# Patient Record
Sex: Male | Born: 1990
Health system: Southern US, Community
[De-identification: ages and names within clinical notes are randomized; demographics above are authoritative.]

## PROBLEM LIST (undated history)

## (undated) DIAGNOSIS — T4145XA Adverse effect of unspecified anesthetic, initial encounter: Secondary | ICD-10-CM

## (undated) DIAGNOSIS — R1310 Dysphagia, unspecified: Secondary | ICD-10-CM

## (undated) DIAGNOSIS — R011 Cardiac murmur, unspecified: Secondary | ICD-10-CM

## (undated) DIAGNOSIS — B3781 Candidal esophagitis: Secondary | ICD-10-CM

## (undated) DIAGNOSIS — S62609A Fracture of unspecified phalanx of unspecified finger, initial encounter for closed fracture: Secondary | ICD-10-CM

## (undated) DIAGNOSIS — Q909 Down syndrome, unspecified: Secondary | ICD-10-CM

## (undated) DIAGNOSIS — K219 Gastro-esophageal reflux disease without esophagitis: Secondary | ICD-10-CM

## (undated) DIAGNOSIS — M1990 Unspecified osteoarthritis, unspecified site: Secondary | ICD-10-CM

## (undated) DIAGNOSIS — E039 Hypothyroidism, unspecified: Secondary | ICD-10-CM

## (undated) DIAGNOSIS — M9110 Juvenile osteochondrosis of head of femur [Legg-Calve-Perthes], unspecified leg: Secondary | ICD-10-CM

## (undated) DIAGNOSIS — F809 Developmental disorder of speech and language, unspecified: Secondary | ICD-10-CM

## (undated) DIAGNOSIS — Z9989 Dependence on other enabling machines and devices: Secondary | ICD-10-CM

## (undated) DIAGNOSIS — J189 Pneumonia, unspecified organism: Secondary | ICD-10-CM

## (undated) DIAGNOSIS — G4733 Obstructive sleep apnea (adult) (pediatric): Secondary | ICD-10-CM

## (undated) DIAGNOSIS — T7840XA Allergy, unspecified, initial encounter: Secondary | ICD-10-CM

## (undated) DIAGNOSIS — R192 Visible peristalsis: Secondary | ICD-10-CM

## (undated) DIAGNOSIS — B349 Viral infection, unspecified: Secondary | ICD-10-CM

## (undated) HISTORY — DX: Candidal esophagitis: B37.81

## (undated) HISTORY — DX: Hypothyroidism, unspecified: E03.9

## (undated) HISTORY — DX: Juvenile osteochondrosis of head of femur (Legg-Calve-Perthes), unspecified leg: M91.10

## (undated) HISTORY — DX: Fracture of unspecified phalanx of unspecified finger, initial encounter for closed fracture: S62.609A

## (undated) HISTORY — DX: Allergy, unspecified, initial encounter: T78.40XA

## (undated) HISTORY — PX: GUM SURGERY: SHX658

## (undated) HISTORY — DX: Viral infection, unspecified: B34.9

## (undated) HISTORY — DX: Down syndrome, unspecified: Q90.9

---
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
## 2011-03-19 ENCOUNTER — Ambulatory Visit
Admission: RE | Admit: 2011-03-19 | Discharge: 2011-03-19 | Disposition: A | Discharge status: Home / Self Care | Payer: Medicaid Other | Source: Ambulatory Visit | Attending: Pediatrics | Admitting: Pediatrics

## 2011-03-19 ENCOUNTER — Other Ambulatory Visit: Payer: Self-pay | Admitting: Pediatrics

## 2011-03-19 DIAGNOSIS — Q909 Down syndrome, unspecified: Secondary | ICD-10-CM

## 2011-04-11 NOTE — Op Note (Signed)
[place]. [facility]  Patient:    Ronald Le, Ronald Le                 MRN: [id number] Proc. Date: 03/04/00 Adm. Date:  [id number] Attending:  Maurilio Lovely                           Operative Report  DATE OF BIRTH:  18-Mar-1991  PREOPERATIVE DIAGNOSIS:  Multiple carious teeth and Down syndrome.  POSTOPERATIVE DIAGNOSIS:  Multiple carious teeth and Down syndrome.  OPERATION PERFORMED:  Full mouth dental rehabilitation.  SURGEON:  Cipriano Mile. Rohlfing, D.D.S., M.S.  ASSISTANT:  Abigail Butts  SPECIMENS:  Five teeth for count only.  These were given to the mother.  DRAINS:  None.  CULTURES:  None.  ESTIMATED BLOOD LOSS:  Less than 5 cc.  ANESTHESIA:  DESCRIPTION OF PROCEDURE:  The patient was brought from the holding area to operating room #7 at [facility].  The patient was placed in supine position on the operating table.  General anesthesia was induced by mask with sevoflurane, nitrous oxide and oxygen.  IV access was obtained and an oral intubation was established.  Six intraoral radiographs were obtained. A throat pack was placed at 12:03.  The dental treatment was as follows.  All teeth were isolated with a rubber dam and teeth restored as follows.  Tooth #3 received a mesial occlusal lingual composite.  Tooth 30 received an occlusal composite.  Tooth 19 received an occlusal composite.  Tooth 14 received an occlusal lingual composite.  Tooth J received a stainless steel crown Ion #2 cemented with Fuji cement. All composites were acid etched.  Single bond adhesive was placed, Flowit composite followed by Z100 composite and then Delton opaque sealant.  To obtain local anesthesia and hemorrhage control, 1.2 cc of 2% lidocaine with 1:100,000 epinephrine was used.  Teeth D, E, G, Q and 24S supernumerary were elevated and removed with forceps.  All teeth received a thorough prophylaxis and ____________ fluoride varnish.  The  mouth was thoroughly cleansed and the throat pack was removed at 1:17 p.m.  The patient tolerated the procedure well and was taken to PACU in stable condition with IV in place.DD:  03/04/00 TD:  03/05/00 Job: [id number]

## 2011-04-11 NOTE — Discharge Summary (Signed)
NAME:  Ronald Le, BORNTREGER NO.:  [id number]   MEDICAL RECORD NO.:  [id number]          PATIENT TYPE:  INP   LOCATION:  6120                         FACILITY:  [facility]   PHYSICIAN:  Pediatrics Resident    DATE OF BIRTH:  1991/10/17   DATE OF ADMISSION:  09/29/2006  DATE OF DISCHARGE:  10/09/2006                                 DISCHARGE SUMMARY   REASON FOR HOSPITALIZATION:  Hadden is a 20 year old white male with trisomy  21 and a 4 day history of escalating viral URI symptoms, with a history of  cyanotic lips, grunting, and oxygen saturations in the emergency department  of 88% on room air.  For a complete HPI please see the admission H&P.   HOSPITAL COURSE:  The patient received albuterol and Atrovent as well as  ceftriaxone IV x1 in the emergency department.  On November 7, the patient  was started on Cefuroxime 1.5 g IV q.8 h. for right lower lobe pneumonia, as  well as Albuterol every 4 hours as needed for wheezing, as well as incentive  spirometry due to increased chest tightness and poor air movement.  On  November 10, due to unimproved O2 requirements and an unchanged chest x-ray,  the patient was started on IV ceftriaxone and azithromycin.  The patient  completed a 5 day course of the azithromycin as in an inpatient.  The  patient continued to wean O2 for sats greater than or equal to 85%.  The  patient was also started on guaifenesin November 11 and continued p.r.n.  albuterol.  The required increase in O2 overnight is likely secondary to  obstructive sleep apnea and we would like to have this followed up as an  outpatient.  By November 16, the day of discharge, the patient was having  good saturations above 85% on room air and his condition was much improved.   OPERATION/PROCEDURE:  1. A chest x-ray was performed on November 6.  It showed a right lower      lobe infiltrate.  2. A chest was performed on November 10 and it was unchanged from the  previous.   FINAL DIAGNOSIS:  1. Right lower lobe pneumonia.  2. Trisomy 21.   DISCHARGE MEDICATIONS AND INSTRUCTIONS:  There are no discharge medications  for this patient.  The patient was asked to seek medical attention for any  respiratory distress, shortness of breath, decreased p.o. intake, or any  other concerns that the parents might have.  Pending issues and results to  be followed; there are no issues to be followed.   FOLLOWUP:  The patient was instructed to call Dr. Dillard Essex office, at [facility], on Tuesday to schedule a followup appointment.  The written  form of this discharge dictation was sent to Dr. Cardell Peach at [phone].  Could you  please fax this discharge dictation to Dr. Cardell Peach at [phone].   DISCHARGE WEIGHT:  44.2 kg   DISCHARGE CONDITION:  Stable and improved.           ______________________________  Pediatrics Resident     PR/MEDQ  D:  10/09/2006  T:  10/10/2006  Job:  [id number]

## 2011-04-11 NOTE — Op Note (Signed)
NAME:  Ronald Le, Ronald Le                    ACCOUNT NO.:  [id number]   MEDICAL RECORD NO.:  [id number]                   PATIENT TYPE:  OIB   LOCATION:  2899                                 FACILITY:  [facility]   PHYSICIAN:  Clydene Laming., D.D.S.          DATE OF BIRTH:  Mar 12, 1991   DATE OF PROCEDURE:  06/20/2004  DATE OF DISCHARGE:                                 OPERATIVE REPORT   DESCRIPTION OF PROCEDURE:  The patient was premedicated and brought to the  operating room and placed on the table in supine position where he remained  throughout the entire procedure.  After successful oral intubation, the  patient was prepped and draped for an intraoral procedure.  No throat pack  was used.  Facial infiltration and right palatal infiltration 2% lidocaine  1:100,000 epinephrine.  Marginal split thickness bed was prepared on the  facial aspect of tooth #25.  Palatal donor tissue was harvested and sutured  coronally, laterally and apically with 5-0 gut suture interrupted.  Zone  bandage was placed over the facial aspect of the gingival graft.  There was  excellent hemostasis, suctioned throat dry.  The patient was then extubated  on the table and found to be breathing well on his own.  Negligible blood  loss during the procedure.  There were no cultures or drains, no biopsies  were submitted.  The operation proceeded smoothly and lasted approximately  20 minutes.  The patient was returned to the recovery room and was to be  released as indicated.  Postoperative instructions were reviewed with  parents in the reception room.                                               Clydene Laming., D.D.S.    RJK/MEDQ  D:  06/20/2004  T:  06/20/2004  Job:  [id number]

## 2011-10-15 ENCOUNTER — Ambulatory Visit (INDEPENDENT_AMBULATORY_CARE_PROVIDER_SITE_OTHER): Payer: BC Managed Care – PPO | Admitting: Medical

## 2011-10-15 ENCOUNTER — Encounter: Payer: Self-pay | Admitting: Medical

## 2011-10-15 VITALS — BP 100/80 | HR 96 | Temp 98.4°F | Resp 20 | Ht <= 58 in | Wt 124.0 lb

## 2011-10-15 DIAGNOSIS — R519 Headache, unspecified: Secondary | ICD-10-CM | POA: Insufficient documentation

## 2011-10-15 DIAGNOSIS — J329 Chronic sinusitis, unspecified: Secondary | ICD-10-CM

## 2011-10-15 DIAGNOSIS — R51 Headache: Secondary | ICD-10-CM | POA: Insufficient documentation

## 2011-10-15 MED ORDER — AMOXICILLIN 875 MG PO TABS: 875.0000 mg | ORAL_TABLET | Freq: Two times a day (BID) | ORAL | Status: AC

## 2011-10-15 NOTE — Progress Notes (Signed)
Subjective:   HPI  Ronald Le is a 20 y.o. male who presents as a new patient today with his parents for c/o headache.  He has a history of Down Syndrome.  Concerns today: been having headaches recently.  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  He was at practice last night doing sit ups.  In the middle of situps turned "white as a sheet", stopped situps immediately and was crying with the headache.  Denied blurred vision, sinus pain, no numbness, tingling.  Gave Tylenol that evening, didn't finish supper, and ended up going to sleep.  Felt some improvement this morning, but headache still ongoing.  They note a few other recent episodes of headache.  Neither headache was related to activity.  These were just random.  He does have allergies, and has complained some of sore throat, frontal headache, and runny nose.  He does have hx/o sinus infection.  He also has hx/o being hospitalized for pneumonia but he can be very sick before his exam findings show.  He also hardly ever gets sick, so when he requests to go to the doctor, his parents take note right away.  Last week he was coughing at school, grabbed throat, and teacher thought he was choking.  He at times tends to eat fast.  He has had a few random episodes of difficulty swallowing in the last few weeks, but not a regular occurrence.  Parents do remind him to eat slowly and chew food, eat small pieces.  No other aggravating or relieving factors.  No other c/o.  The following portions of the patient's history were reviewed and updated as appropriate: allergies, current medications, past family history, past medical history, past social history, past surgical history and problem list.  Past Medical History  Diagnosis Date  . Allergy   . Hypothyroidism   . Legg-Calve-Perthes disease   . Sleep apnea   . Down's syndrome   . History of pneumonia     multiple hospitalizions as toddler, but 10 day stay age 20yo  . Rash    hospitalized as a child, etiology latent viral exanthem   Review of Systems Constitutional: -fever, -chills, -sweats, +unexpected -weight change,-fatigue ENT: +runny nose, -ear pain, -sore throat Cardiology:  +chest pain, -palpitations, -edema Respiratory: +cough, -shortness of breath, -wheezing Gastroenterology: +abdominal pain, -nausea, -vomiting, -diarrhea, -constipation Hematology: -bleeding or bruising problems Musculoskeletal: -arthralgias, -myalgias, -joint swelling, -back pain Ophthalmology: -vision changes Urology: -dysuria, -difficulty urinating, -hematuria, -urinary frequency, -urgency Neurology: +headache, -weakness, -tingling, -numbness    Objective:   Physical Exam  Filed Vitals:   10/15/11 1447  BP: 100/80  Pulse: 96  Temp: 98.4 F (36.9 C)  Resp: 20    General appearance: alert, no distress, WD/WN, white male HEENT: normocephalic, sclerae anicteric, PERRLA, EOMi, left TM somewhat retracted, right TM normal, nares with turbinate edema, clear discharge. but no erythema, pharynx normal Oral cavity: MMM, no lesions Neck: supple, no lymphadenopathy, no thyromegaly, no masses Heart: RRR, normal S1, S2, no murmurs Lungs: CTA bilaterally, no wheezes, rhonchi, or rales Abdomen: +bs, soft, non tender, non distended, no masses, no hepatomegaly, no splenomegaly Back: non tender Musculoskeletal: nontender, no swelling, no obvious deformity Extremities: no edema, no cyanosis, no clubbing Pulses: 2+ symmetric, upper and lower extremities, normal cap refill Neurological: CN2-12 intact, strength normal upper extremities and lower extremities, sensation normal throughout, DTRs 2+ throughout, no cerebellar signs, gait normal Psychiatric: normal affect, behavior normal, pleasant    Assessment and Plan :  Encounter Diagnoses  Name Primary?  . Headache Yes  . Sinusitis    Discussed possible etiologies for headache.  Given exam and recent symptoms and prior history of  sinusitis, sinusitis seems likely.  Script for Amoxicillin, advised rest, hydrate well, Tylenol or Ibuprofen for pain, and call in 5-7 days to let me know if improving.  No worrisome findings today of more concerning etiology.  Advised if worrisome signs, such as high fever, numbness, weakness, double vision, intractable headache, or just worsening in general, to call, return, or go to the ED.  Advised to encourage him to eat slowly, chew, and drink plenty of fluids with meals.  If symptoms of dysphagia continue, then recheck.  Otherwise, recheck 1wk.

## 2011-12-25 ENCOUNTER — Encounter: Payer: Self-pay | Admitting: *Deleted

## 2012-04-22 ENCOUNTER — Encounter (HOSPITAL_COMMUNITY): Payer: Self-pay | Admitting: Emergency Medicine

## 2012-04-22 ENCOUNTER — Emergency Department (HOSPITAL_COMMUNITY)
Admission: EM | Admit: 2012-04-22 | Discharge: 2012-04-23 | Disposition: A | Discharge status: Home / Self Care | Payer: BC Managed Care – PPO | Attending: Emergency Medicine | Admitting: Emergency Medicine

## 2012-04-22 ENCOUNTER — Emergency Department (HOSPITAL_COMMUNITY): Payer: BC Managed Care – PPO

## 2012-04-22 DIAGNOSIS — R05 Cough: Secondary | ICD-10-CM | POA: Insufficient documentation

## 2012-04-22 DIAGNOSIS — G4730 Sleep apnea, unspecified: Secondary | ICD-10-CM | POA: Insufficient documentation

## 2012-04-22 DIAGNOSIS — Q909 Down syndrome, unspecified: Secondary | ICD-10-CM | POA: Insufficient documentation

## 2012-04-22 DIAGNOSIS — E039 Hypothyroidism, unspecified: Secondary | ICD-10-CM | POA: Insufficient documentation

## 2012-04-22 DIAGNOSIS — J189 Pneumonia, unspecified organism: Secondary | ICD-10-CM

## 2012-04-22 DIAGNOSIS — R059 Cough, unspecified: Secondary | ICD-10-CM | POA: Insufficient documentation

## 2012-04-22 DIAGNOSIS — M4120 Other idiopathic scoliosis, site unspecified: Secondary | ICD-10-CM | POA: Insufficient documentation

## 2012-04-22 DIAGNOSIS — R509 Fever, unspecified: Secondary | ICD-10-CM | POA: Insufficient documentation

## 2012-04-22 LAB — URINALYSIS, ROUTINE W REFLEX MICROSCOPIC
Hgb urine dipstick: NEGATIVE
Ketones, ur: >80 mg/dL — AB
Nitrite: NEGATIVE
Protein, ur: NEGATIVE mg/dL
Urobilinogen, UA: 0.2 mg/dL (ref 0.0–1.0)

## 2012-04-22 LAB — BASIC METABOLIC PANEL
BUN: 14 mg/dL (ref 6–23)
Calcium: 9.5 mg/dL (ref 8.4–10.5)
Creatinine, Ser: 1.02 mg/dL (ref 0.50–1.35)
GFR calc Af Amer: >90 mL/min (ref >90)
GFR calc non Af Amer: >90 mL/min (ref >90)
Potassium: 3.6 mEq/L (ref 3.5–5.1)

## 2012-04-22 LAB — DIFFERENTIAL
Basophils Relative: 0 % (ref 0–1)
Eosinophils Absolute: 0 10*3/uL (ref 0.0–0.7)
Eosinophils Relative: 0 % (ref 0–5)
Monocytes Relative: 10 % (ref 3–12)
Neutrophils Relative %: 81 % — ABNORMAL HIGH (ref 43–77)

## 2012-04-22 LAB — CBC
Hemoglobin: 16.5 g/dL (ref 13.0–17.0)
MCH: 33 pg (ref 26.0–34.0)
MCHC: 36.3 g/dL — ABNORMAL HIGH (ref 30.0–36.0)
MCV: 91 fL (ref 78.0–100.0)

## 2012-04-22 MED ORDER — SODIUM CHLORIDE 0.9 % IV BOLUS (SEPSIS)
1000.0000 mL | Freq: Once | INTRAVENOUS | Status: AC
  Administered 2012-04-22: 1000 mL via INTRAVENOUS

## 2012-04-22 MED ORDER — DEXTROSE 5 % IV SOLN
500.0000 mg | Freq: Once | INTRAVENOUS | Status: AC
  Administered 2012-04-22: 500 mg via INTRAVENOUS
  Filled 2012-04-22: qty 500

## 2012-04-22 MED ORDER — DEXTROSE 5 % IV SOLN
1.0000 g | Freq: Once | INTRAVENOUS | Status: AC
  Administered 2012-04-22: 1 g via INTRAVENOUS
  Filled 2012-04-22: qty 10

## 2012-04-22 MED ORDER — ACETAMINOPHEN 325 MG PO TABS
650.0000 mg | ORAL_TABLET | Freq: Once | ORAL | Status: AC
  Administered 2012-04-22: 650 mg via ORAL
  Filled 2012-04-22: qty 2

## 2012-04-22 NOTE — ED Notes (Signed)
PARENTS REPORTS FEVER SINCE YESTERDAY WITH OCCASIONAL DRY COUGH / MID ABDOMINAL PAIN AND BACK PAIN , PT. HAS DOWN'S SYNDROME .

## 2012-04-23 LAB — HEPATIC FUNCTION PANEL
ALT: 15 U/L (ref 0–53)
Alkaline Phosphatase: 81 U/L (ref 39–117)
Indirect Bilirubin: 0.5 mg/dL (ref 0.3–0.9)
Total Bilirubin: 0.7 mg/dL (ref 0.3–1.2)

## 2012-04-23 MED ORDER — ACETAMINOPHEN-CODEINE 120-12 MG/5ML PO SUSP: 5.0000 mL | Freq: Three times a day (TID) | ORAL | Status: DC | PRN

## 2012-04-23 MED ORDER — MOXIFLOXACIN HCL 400 MG PO TABS: 400.0000 mg | ORAL_TABLET | Freq: Every day | ORAL | Status: DC

## 2012-04-23 NOTE — ED Provider Notes (Signed)
History     CSN: [id number]  Arrival date & time 04/22/12  2225   First MD Initiated Contact with Patient 04/22/12 2257      Chief Complaint  Patient presents with  . Fever    (Consider location/radiation/quality/duration/timing/severity/associated sxs/prior treatment) HPI History provided by patient and parents bedside. Has history of Down syndrome and is somewhat limited historian. He developed fever and cough tonight. Mother very concerned because fever persists despite Motrin and Tylenol at home.  Temp to 101.  No productive sputum. Mother also very concerned because she developed bilateral pneumonia last year requiring admission for significant hypoxia. No rash. No recent travel. No known sick contacts. Does use CPAP at night. Is not complaining of any shortness of breath at this time. Has got up to use the restroom emergency department without any difficulty breathing while ambulating.  Patient denies any current complaints other than feels thirsty.  Past Medical History  Diagnosis Date  . Allergy   . Hypothyroidism   . Legg-Calve-Perthes disease   . Sleep apnea   . Down's syndrome   . History of pneumonia     multiple hospitalizions as toddler, but 10 day stay age 11yo  . Rash     hospitalized as a child, etiology latent viral exanthem  . Viral syndrome 09/1995    hospital viral syndrome.  . Chronic serous otitis media   . Scoliosis 6/99    leg length discrepancy.  . Finger fracture 2009    h/o    Past Surgical History  Procedure Date  . Hip arthroplasty     due to Legg-Calve-Perthe dz and subsequent surgery to remove hardware  . Tympanostomy tube placement   . Gum surgery     graft to gums from roof of mouth.    Family History  Problem Relation Age of Onset  . Irritable bowel syndrome Mother   . Interstitial cystitis Mother   . Gallbladder disease Father     gallbladder surgery  . Migraines Sister   . Alzheimer's disease Maternal Grandmother     dementia.     History  Substance Use Topics  . Smoking status: Never Smoker   . Smokeless tobacco: Not on file  . Alcohol Use: No      Review of Systems  Constitutional: Positive for fever.  HENT: Negative for neck pain and neck stiffness.   Eyes: Negative for discharge.  Respiratory: Positive for cough.   Gastrointestinal: Negative for abdominal pain.  Genitourinary: Negative for flank pain.  Musculoskeletal: Negative for back pain.  Skin: Negative for rash.  Neurological: Negative for headaches.  All other systems reviewed and are negative.    Allergies  Review of patient's allergies indicates no known allergies.  Home Medications   Current Outpatient Rx  Name Route Sig Dispense Refill  . LEVOTHYROXINE SODIUM 88 MCG PO TABS Oral Take 88 mcg by mouth daily.      . ACETAMINOPHEN-CODEINE 120-12 MG/5ML PO SUSP Oral Take 5 mLs by mouth every 8 (eight) hours as needed (Take at nighttime as needed for cough). 60 mL 0  . MOXIFLOXACIN HCL 400 MG PO TABS Oral Take 1 tablet (400 mg total) by mouth daily. 10 tablet 0    BP 110/75  Pulse 118  Temp(Src) 98.9 F (37.2 C) (Rectal)  Resp 20  SpO2 96%  Physical Exam  Constitutional: He appears well-developed and well-nourished.  HENT:  Head: Normocephalic and atraumatic.  Eyes: Conjunctivae and EOM are normal. Pupils are equal, round, and  reactive to light.  Neck: Trachea normal. Neck supple. No thyromegaly present.  Cardiovascular: Normal rate, regular rhythm, S1 normal, S2 normal and normal pulses.     No systolic murmur is present   No diastolic murmur is present  Pulses:      Radial pulses are 2+ on the right side, and 2+ on the left side.  Pulmonary/Chest: Effort normal and breath sounds normal. He has no wheezes. He has no rhonchi. He has no rales. He exhibits no tenderness.       Good air movement without respiratory distress  Abdominal: Soft. Normal appearance and bowel sounds are normal. There is no tenderness. There is no  CVA tenderness and negative Murphy's sign.  Musculoskeletal:       BLE:s Calves nontender, no cords or erythema, negative Homans sign  Neurological: He is alert. He has normal strength. No cranial nerve deficit or sensory deficit. GCS eye subscore is 4. GCS verbal subscore is 5. GCS motor subscore is 6.       Interactive, smiling and appropriate  Skin: Skin is warm and dry. No rash noted. He is not diaphoretic.    ED Course  Procedures (including critical care time)  Labs Reviewed  CBC - Abnormal; Notable for the following:    WBC 13.7 (*)    MCHC 36.3 (*)    All other components within normal limits  DIFFERENTIAL - Abnormal; Notable for the following:    Neutrophils Relative 81 (*)    Neutro Abs 11.1 (*)    Lymphocytes Relative 8 (*)    Monocytes Absolute 1.4 (*)    All other components within normal limits  BASIC METABOLIC PANEL - Abnormal; Notable for the following:    Glucose, Bld 111 (*)    All other components within normal limits  URINALYSIS, ROUTINE W REFLEX MICROSCOPIC - Abnormal; Notable for the following:    Bilirubin Urine SMALL (*)    Ketones, ur >80 (*)    All other components within normal limits  LIPASE, BLOOD  HEPATIC FUNCTION PANEL   Dg Chest 2 View  04/22/2012  *RADIOLOGY REPORT*  Clinical Data: Cough with fever and headaches for 2 days.  CHEST - 2 VIEW  Comparison: 10/03/2006.  Findings: Normal heart size with slight hyperinflation. Faint opacity right lower lung zones, likely right lower lobe, suggests early pneumonia.  Mild peribronchial thickening.  No effusion or pneumothorax.   Bones are negative.  Similar appearance to priors.  IMPRESSION: Slight hyperinflation. Cannot exclude early right lower lobe infiltrate.  Mild peribronchial thickening.  Original Report Authenticated By: Elsie Stain, M.D.     1. Community acquired pneumonia     Patient evaluated with x-ray and labs as above. For clinical pneumonia, IV antibiotics initiated  No hypoxia or  hypotension. Tachycardia improved with fluids. Serial evaluations remains unchanged  MDM   Cough and fevers with pneumonia as above. Stable for outpatient treatment and followup. Prescription for antibiotics and Tylenol with codeine for cough provided.         Sunnie Nielsen, MD 04/23/12 [phone]

## 2012-04-23 NOTE — Discharge Instructions (Signed)

## 2012-04-24 DIAGNOSIS — B3781 Candidal esophagitis: Secondary | ICD-10-CM

## 2012-04-24 HISTORY — DX: Candidal esophagitis: B37.81

## 2012-04-29 ENCOUNTER — Ambulatory Visit (INDEPENDENT_AMBULATORY_CARE_PROVIDER_SITE_OTHER): Payer: BC Managed Care – PPO | Admitting: Family Medicine

## 2012-04-29 ENCOUNTER — Ambulatory Visit
Admission: RE | Admit: 2012-04-29 | Discharge: 2012-04-29 | Disposition: A | Discharge status: Home / Self Care | Payer: BC Managed Care – PPO | Source: Ambulatory Visit | Attending: Family Medicine | Admitting: Family Medicine

## 2012-04-29 ENCOUNTER — Encounter: Payer: Self-pay | Admitting: Family Medicine

## 2012-04-29 VITALS — BP 108/60 | HR 88 | Temp 97.4°F | Ht <= 58 in | Wt 108.0 lb

## 2012-04-29 DIAGNOSIS — Q909 Down syndrome, unspecified: Secondary | ICD-10-CM | POA: Insufficient documentation

## 2012-04-29 DIAGNOSIS — J189 Pneumonia, unspecified organism: Secondary | ICD-10-CM

## 2012-04-29 DIAGNOSIS — R1110 Vomiting, unspecified: Secondary | ICD-10-CM

## 2012-04-29 DIAGNOSIS — E039 Hypothyroidism, unspecified: Secondary | ICD-10-CM

## 2012-04-29 LAB — CBC WITH DIFFERENTIAL/PLATELET
Lymphocytes Relative: 5 % — ABNORMAL LOW (ref 12–46)
MCHC: 33.6 g/dL (ref 30.0–36.0)
Monocytes Absolute: 0.8 10*3/uL (ref 0.1–1.0)
Neutro Abs: 14.7 10*3/uL — ABNORMAL HIGH (ref 1.7–7.7)
Platelets: 254 10*3/uL (ref 150–400)
RDW: 12.6 % (ref 11.5–15.5)
WBC: 16.3 10*3/uL — ABNORMAL HIGH (ref 4.0–10.5)

## 2012-04-29 MED ORDER — ONDANSETRON 4 MG PO TBDP: 4.0000 mg | ORAL_TABLET | Freq: Three times a day (TID) | ORAL | Status: DC | PRN

## 2012-04-29 NOTE — Progress Notes (Signed)
Chief Complaint  Patient presents with  . Follow-up    went to hospital, and ot any better, hurts to eat and no energy, tempature is up and down   HPI:  Patient went to ER 5/31 with fever 101 and worsening cough.  ER notes reviewed.  WBC 13.7.  CXR showed possibly early RLL infiltrate, mild peribronchial thickening. He was put on Avelox, and initially seemed to be doing better--fever went away, energy improved, back to his usual self, with occasional cough.  Today he started coughing very deeply "coughing up a lung".  Tactile fevers (didn't use thermometer), went up and down.  Started vomiting at 1pm.  Father reports that the vomiting seems to be due to a coughing spell.  Decreased appetite today.  Difficult to truly assess if there is nausea, with vomiting, vs only post-tussive emesis.    Denies sneezing, some nasal congestion.  Some frontal headaches. Some increased shortness of breath noted just this afternoon.    Past Medical History  Diagnosis Date  . Allergy   . Hypothyroidism   . Legg-Calve-Perthes disease   . Sleep apnea   . Down's syndrome   . History of pneumonia     multiple hospitalizions as toddler, but 10 day stay age 19yo  . Rash     hospitalized as a child, etiology latent viral exanthem  . Viral syndrome 09/1995    hospital viral syndrome.  . Chronic serous otitis media   . Scoliosis 6/99    leg length discrepancy.  . Finger fracture 2009    h/o   Past Surgical History  Procedure Date  . Hip arthroplasty     due to Legg-Calve-Perthe dz and subsequent surgery to remove hardware  . Tympanostomy tube placement   . Gum surgery     graft to gums from roof of mouth.   History   Social History  . Marital Status: Single    Spouse Name: N/A    Number of Children: N/A  . Years of Education: N/A   Occupational History  . Not on file.   Social History Main Topics  . Smoking status: Never Smoker   . Smokeless tobacco: Not on file  . Alcohol Use: No  . Drug Use:  No  . Sexually Active: Not on file   Other Topics Concern  . Not on file   Social History Narrative  . No narrative on file    Current Outpatient Prescriptions on File Prior to Visit  Medication Sig Dispense Refill  . acetaminophen-codeine 120-12 MG/5ML suspension Take 5 mLs by mouth every 8 (eight) hours as needed (Take at nighttime as needed for cough).  60 mL  0  . levothyroxine (SYNTHROID, LEVOTHROID) 88 MCG tablet Take 88 mcg by mouth daily.        Marland Kitchen moxifloxacin (AVELOX) 400 MG tablet Take 1 tablet (400 mg total) by mouth daily.  10 tablet  0   No Known Allergies  ROS:  Denies ear pain, diarrhea, skin rash.  Limited ability to get ROS, father assisting some. +headache, sore throat, cough, vomiting, belly pain after vomiting.  PHYSICAL EXAM: BP 108/60  Pulse 88  Temp(Src) 97.4 F (36.3 C) (Oral)  Ht 4\' 9"  (1.448 m)  Wt 108 lb (48.988 kg)  BMI 23.37 kg/m2 Pulse ox 98%, P 150--checked shortly after vomiting. Pleasant male with Downs Syndrome, accompanied by his father.  Actively gagging during part of visit, sometimes after coughing, other times just gagging (and no vomiting).  He  appears in no acute distress between episodes, breathing comfortably.  Only speaks short 1 word answers, as is typical for him per dad (not due to dyspnea) HEENT:  Conjunctiva clear. TM's--partly obscured by cerumen.  Chronic perforation (s/p tubes) on R.  OP--thickened mucus, somewhat dry; some thickened yellowish phlegm on R side posteriorly Neck: no lymphadenopathy Heart: tachycardic--pulse 125 at time of my exam, about 15 minutes after vomiting (when had gone up to 150).  No murmur Lungs: clear bilaterally with good air movement. Abdomen: soft (not laid down for exam given that he was sitting up with trash can most of visit). Skin: no rash Extremities: no cyanosis or edema  ASSESSMENT/PLAN: 1. Pneumonia  CBC with Differential, DG Chest 2 View  2. Vomiting  ondansetron (ZOFRAN ODT) 4 MG  disintegrating tablet   Pneumonia clinically appeared to be resolving, per father, until worsening symptoms with recurrent tactile fevers and vomiting started today.  Check CBC to see if WBC improving vs worsening.  Recheck CXR.  I feel that thick mucus is likely contributing to his gagging--may have some postnasal drainage also (and any sinus infection should also be covered by the Avelox he is taking). Complete course of Avelox Increase fluid intake Guaifenesin is recommended  Return next week for f/u, to ER sooner if worsening symptoms.  Will notify of results later today.  If worsening, may need to be admitted for IV ABX (clinically, lungs sounded pretty good).  Results: Lab Results  Component Value Date   WBC 16.3* 04/29/2012   HGB 18.2* 04/29/2012   HCT 54.3* 04/29/2012   MCV 94.8 04/29/2012   PLT 254 04/29/2012   WBC higher, appears to be dehydrated. CXR normal  Parent advised to try zofran and orally hydrate, to go ER if getting worse, f/u next week. Apparently had 2 episodes of diarrhea after leaving office, as well as further emesis (prior to starting Zofran)

## 2012-04-29 NOTE — Patient Instructions (Signed)
Try and drink plenty of fluids/water. Take guaifenesin (ie Mucinex, Robitussin) to help thin out the secretions. Continue to take Avelox (finish the course of antibiotics as prescribed in ER). Take the ondansetron if needed for nausea/vomiting  Take him to ER if worsening shortness of breath, unable to keep fluids down, getting dehydrated.  We will be in touch with results of blood test and x-ray probably later today (or tomorrow morning at latest).  Follow up next week if seeming to improve over the next few days, to ER sooner if worse.

## 2012-05-01 ENCOUNTER — Inpatient Hospital Stay (HOSPITAL_COMMUNITY)
Admission: EM | Admit: 2012-05-01 | Discharge: 2012-05-04 | DRG: 183 | Disposition: A | Discharge status: Home / Self Care | Payer: BC Managed Care – PPO | Attending: Internal Medicine | Admitting: Internal Medicine

## 2012-05-01 ENCOUNTER — Encounter (HOSPITAL_COMMUNITY): Payer: Self-pay | Admitting: Emergency Medicine

## 2012-05-01 ENCOUNTER — Emergency Department (HOSPITAL_COMMUNITY): Payer: BC Managed Care – PPO

## 2012-05-01 DIAGNOSIS — E860 Dehydration: Secondary | ICD-10-CM | POA: Diagnosis present

## 2012-05-01 DIAGNOSIS — R112 Nausea with vomiting, unspecified: Secondary | ICD-10-CM

## 2012-05-01 DIAGNOSIS — J159 Unspecified bacterial pneumonia: Secondary | ICD-10-CM

## 2012-05-01 DIAGNOSIS — R197 Diarrhea, unspecified: Secondary | ICD-10-CM | POA: Diagnosis present

## 2012-05-01 DIAGNOSIS — B3781 Candidal esophagitis: Secondary | ICD-10-CM | POA: Diagnosis present

## 2012-05-01 DIAGNOSIS — Z79899 Other long term (current) drug therapy: Secondary | ICD-10-CM

## 2012-05-01 DIAGNOSIS — J209 Acute bronchitis, unspecified: Secondary | ICD-10-CM

## 2012-05-01 DIAGNOSIS — B370 Candidal stomatitis: Secondary | ICD-10-CM | POA: Diagnosis present

## 2012-05-01 DIAGNOSIS — M9190 Juvenile osteochondrosis of hip and pelvis, unspecified, unspecified leg: Secondary | ICD-10-CM | POA: Diagnosis present

## 2012-05-01 DIAGNOSIS — J189 Pneumonia, unspecified organism: Secondary | ICD-10-CM | POA: Diagnosis present

## 2012-05-01 DIAGNOSIS — R1310 Dysphagia, unspecified: Secondary | ICD-10-CM | POA: Diagnosis present

## 2012-05-01 DIAGNOSIS — G4733 Obstructive sleep apnea (adult) (pediatric): Secondary | ICD-10-CM | POA: Diagnosis present

## 2012-05-01 DIAGNOSIS — Q909 Down syndrome, unspecified: Secondary | ICD-10-CM

## 2012-05-01 DIAGNOSIS — E039 Hypothyroidism, unspecified: Secondary | ICD-10-CM | POA: Diagnosis present

## 2012-05-01 LAB — POCT I-STAT, CHEM 8
Creatinine, Ser: 1.2 mg/dL (ref 0.50–1.35)
Glucose, Bld: 93 mg/dL (ref 70–99)
HCT: 51 % (ref 39.0–52.0)
Hemoglobin: 17.3 g/dL — ABNORMAL HIGH (ref 13.0–17.0)
Sodium: 142 mEq/L (ref 135–145)
TCO2: 25 mmol/L (ref 0–100)

## 2012-05-01 LAB — URINALYSIS, ROUTINE W REFLEX MICROSCOPIC
Glucose, UA: NEGATIVE mg/dL
Hgb urine dipstick: NEGATIVE
Specific Gravity, Urine: 1.02 (ref 1.005–1.030)
Urobilinogen, UA: 0.2 mg/dL (ref 0.0–1.0)

## 2012-05-01 LAB — CBC
HCT: 48.3 % (ref 39.0–52.0)
Hemoglobin: 16.8 g/dL (ref 13.0–17.0)
MCH: 31.8 pg (ref 26.0–34.0)
MCHC: 34.8 g/dL (ref 30.0–36.0)
RBC: 5.29 MIL/uL (ref 4.22–5.81)

## 2012-05-01 LAB — DIFFERENTIAL
Basophils Relative: 0 % (ref 0–1)
Lymphs Abs: 1.4 10*3/uL (ref 0.7–4.0)
Monocytes Absolute: 1.3 10*3/uL — ABNORMAL HIGH (ref 0.1–1.0)
Monocytes Relative: 6 % (ref 3–12)
Neutro Abs: 17 10*3/uL — ABNORMAL HIGH (ref 1.7–7.7)
Neutrophils Relative %: 86 % — ABNORMAL HIGH (ref 43–77)

## 2012-05-01 LAB — HEPATIC FUNCTION PANEL
Bilirubin, Direct: 0.1 mg/dL (ref 0.0–0.3)
Indirect Bilirubin: 0.4 mg/dL (ref 0.3–0.9)
Total Protein: 7 g/dL (ref 6.0–8.3)

## 2012-05-01 LAB — PROCALCITONIN: Procalcitonin: 0.18 ng/mL

## 2012-05-01 LAB — LACTIC ACID, PLASMA: Lactic Acid, Venous: 1.5 mmol/L (ref 0.5–2.2)

## 2012-05-01 MED ORDER — SODIUM CHLORIDE 0.9 % IV SOLN
INTRAVENOUS | Status: DC
  Administered 2012-05-01 (×2): via INTRAVENOUS

## 2012-05-01 MED ORDER — PIPERACILLIN-TAZOBACTAM 3.375 G IVPB
3.3750 g | Freq: Three times a day (TID) | INTRAVENOUS | Status: DC
  Administered 2012-05-02 – 2012-05-03 (×4): 3.375 g via INTRAVENOUS
  Filled 2012-05-01 (×6): qty 50

## 2012-05-01 MED ORDER — SODIUM CHLORIDE 0.9 % IV SOLN: INTRAVENOUS | Status: DC

## 2012-05-01 MED ORDER — GUAIFENESIN 100 MG/5ML PO SYRP
200.0000 mg | ORAL_SOLUTION | ORAL | Status: DC | PRN
  Administered 2012-05-02 – 2012-05-03 (×2): 200 mg via ORAL
  Filled 2012-05-01 (×2): qty 118

## 2012-05-01 MED ORDER — POTASSIUM CHLORIDE IN NACL 20-0.9 MEQ/L-% IV SOLN
INTRAVENOUS | Status: AC
  Administered 2012-05-02: 01:00:00 via INTRAVENOUS
  Filled 2012-05-01 (×2): qty 1000

## 2012-05-01 MED ORDER — LEVOTHYROXINE SODIUM 88 MCG PO TABS
88.0000 ug | ORAL_TABLET | Freq: Every day | ORAL | Status: DC
  Administered 2012-05-02 – 2012-05-04 (×3): 88 ug via ORAL
  Filled 2012-05-01 (×4): qty 1

## 2012-05-01 MED ORDER — VANCOMYCIN HCL 500 MG IV SOLR
500.0000 mg | Freq: Two times a day (BID) | INTRAVENOUS | Status: DC
  Administered 2012-05-02 – 2012-05-03 (×3): 500 mg via INTRAVENOUS
  Filled 2012-05-01 (×5): qty 500

## 2012-05-01 MED ORDER — VANCOMYCIN HCL IN DEXTROSE 1-5 GM/200ML-% IV SOLN
1000.0000 mg | Freq: Once | INTRAVENOUS | Status: AC
  Administered 2012-05-01: 1000 mg via INTRAVENOUS
  Filled 2012-05-01: qty 200

## 2012-05-01 MED ORDER — SODIUM CHLORIDE 0.9 % IV SOLN
Freq: Once | INTRAVENOUS | Status: AC
  Administered 2012-05-01: 20:00:00 via INTRAVENOUS

## 2012-05-01 MED ORDER — SODIUM CHLORIDE 0.9 % IV BOLUS (SEPSIS)
1000.0000 mL | Freq: Once | INTRAVENOUS | Status: AC
  Administered 2012-05-01: 1000 mL via INTRAVENOUS

## 2012-05-01 MED ORDER — PIPERACILLIN-TAZOBACTAM 3.375 G IVPB
3.3750 g | Freq: Once | INTRAVENOUS | Status: AC
  Administered 2012-05-01: 3.375 g via INTRAVENOUS
  Filled 2012-05-01: qty 50

## 2012-05-01 NOTE — ED Notes (Signed)
Admitting at bedside 

## 2012-05-01 NOTE — ED Provider Notes (Signed)
History     CSN: [id number]  Arrival date & time 05/01/12  1750   First MD Initiated Contact with Patient 05/01/12 1931      Chief Complaint  Patient presents with  . Fever    (Consider location/radiation/quality/duration/timing/severity/associated sxs/prior treatment) HPI Comments: The patient is a 21 year old man with Down syndrome. He was seen 9 days ago, and was found to have pneumonia. He was treated with Avelox. He seemed to do better initially, but several days ago he seemed to get worse again, with recurrence of cough and fever. He was seen by his primary care physician, Joselyn Arrow, M.D. 2 days ago, who ordered a chest x-ray that was negative. He finished out his prescription for Avelox this morning. Today he's had chills and cough productive of sputum, is not eating and drinking, and is sleeping a lot. The mother says he's had chills but no fever. He therefore was brought for reevaluation.  Patient is a 21 y.o. male presenting with fever. The history is provided by a relative and medical records. No language interpreter was used.  Fever Primary symptoms of the febrile illness include fever and cough. The current episode started more than 1 week ago. This is a recurrent problem. The problem has been gradually worsening.  Episode onset: He has had chills today, but no measured fever. The fever has been gradually worsening since its onset.  The cough began more than 1 week ago. The cough is recurrent. The cough is productive.    Past Medical History  Diagnosis Date  . Allergy   . Hypothyroidism   . Legg-Calve-Perthes disease   . Sleep apnea   . Down's syndrome   . History of pneumonia     multiple hospitalizions as toddler, but 10 day stay age 89yo  . Rash     hospitalized as a child, etiology latent viral exanthem  . Viral syndrome 09/1995    hospital viral syndrome.  . Chronic serous otitis media   . Scoliosis 6/99    leg length discrepancy.  . Finger fracture 2009   h/o    Past Surgical History  Procedure Date  . Hip arthroplasty     due to Legg-Calve-Perthe dz and subsequent surgery to remove hardware  . Tympanostomy tube placement   . Gum surgery     graft to gums from roof of mouth.    Family History  Problem Relation Age of Onset  . Irritable bowel syndrome Mother   . Interstitial cystitis Mother   . Gallbladder disease Father     gallbladder surgery  . Migraines Sister   . Alzheimer's disease Maternal Grandmother     dementia.    History  Substance Use Topics  . Smoking status: Never Smoker   . Smokeless tobacco: Not on file  . Alcohol Use: No      Review of Systems  Constitutional: Positive for fever, chills and appetite change.  HENT: Negative.   Eyes: Negative.   Respiratory: Positive for cough.   Cardiovascular: Negative.   Gastrointestinal: Negative.   Genitourinary: Negative.   Musculoskeletal: Negative.   Skin: Negative.   Neurological: Negative.   Psychiatric/Behavioral: Negative.     Allergies  Review of patient's allergies indicates no known allergies.  Home Medications   Current Outpatient Rx  Name Route Sig Dispense Refill  . ACETAMINOPHEN-CODEINE 120-12 MG/5ML PO SUSP Oral Take 5 mLs by mouth every 8 (eight) hours as needed.    . IBUPROFEN 200 MG PO TABS Oral Take  800 mg by mouth every 8 (eight) hours as needed. For pain    . LEVOTHYROXINE SODIUM 88 MCG PO TABS Oral Take 88 mcg by mouth daily.      Marland Kitchen MOXIFLOXACIN HCL 400 MG PO TABS Oral Take 400 mg by mouth daily.    Marland Kitchen ONDANSETRON 4 MG PO TBDP Oral Take 4 mg by mouth every 8 (eight) hours as needed.      BP 111/83  Pulse 133  Temp 99.3 F (37.4 C)  Resp 18  SpO2 97%  Physical Exam  Nursing note and vitals reviewed. Constitutional: He appears well-developed and well-nourished. Distressed: Has a hacking cough.       He has a resting tachycardia of 130.  HENT:  Head: Normocephalic and atraumatic.  Right Ear: External ear normal.  Left Ear:  External ear normal.       He has a white coating on his tongue.  Eyes: Conjunctivae and EOM are normal. Pupils are equal, round, and reactive to light.  Neck: Normal range of motion. Neck supple.  Cardiovascular: Regular rhythm and normal heart sounds.        Tachycardia of 130.  Pulmonary/Chest: Effort normal and breath sounds normal. He has no wheezes. He has no rales.  Abdominal: Soft. Bowel sounds are normal.  Musculoskeletal: Normal range of motion. He exhibits no edema and no tenderness.  Neurological: He is alert.       No sensory or motor deficit.   Skin: Skin is warm and dry.  Psychiatric: He has a normal mood and affect. His behavior is normal.    ED Course  Procedures (including critical care time)  Labs Reviewed  CBC - Abnormal; Notable for the following:    WBC 19.8 (*)    All other components within normal limits  DIFFERENTIAL - Abnormal; Notable for the following:    Neutrophils Relative 86 (*)    Neutro Abs 17.0 (*)    Lymphocytes Relative 7 (*)    Monocytes Absolute 1.3 (*)    All other components within normal limits  POCT I-STAT, CHEM 8 - Abnormal; Notable for the following:    Hemoglobin 17.3 (*)    All other components within normal limits  URINALYSIS, ROUTINE W REFLEX MICROSCOPIC   7:32 PM Patient was seen and had physical exam. Old charts were reviewed. Lab tests were ordered.  9:33 PM Results for orders placed during the hospital encounter of 05/01/12  CBC      Component Value Range   WBC 19.8 (*) 4.0 - 10.5 (K/uL)   RBC 5.29  4.22 - 5.81 (MIL/uL)   Hemoglobin 16.8  13.0 - 17.0 (g/dL)   HCT 45.4  09.8 - 11.9 (%)   MCV 91.3  78.0 - 100.0 (fL)   MCH 31.8  26.0 - 34.0 (pg)   MCHC 34.8  30.0 - 36.0 (g/dL)   RDW 14.7  82.9 - 56.2 (%)   Platelets 222  150 - 400 (K/uL)  DIFFERENTIAL      Component Value Range   Neutrophils Relative 86 (*) 43 - 77 (%)   Neutro Abs 17.0 (*) 1.7 - 7.7 (K/uL)   Lymphocytes Relative 7 (*) 12 - 46 (%)   Lymphs Abs 1.4   0.7 - 4.0 (K/uL)   Monocytes Relative 6  3 - 12 (%)   Monocytes Absolute 1.3 (*) 0.1 - 1.0 (K/uL)   Eosinophils Relative 1  0 - 5 (%)   Eosinophils Absolute 0.1  0.0 - 0.7 (K/uL)  Basophils Relative 0  0 - 1 (%)   Basophils Absolute 0.0  0.0 - 0.1 (K/uL)  URINALYSIS, ROUTINE W REFLEX MICROSCOPIC      Component Value Range   Color, Urine YELLOW  YELLOW    APPearance CLEAR  CLEAR    Specific Gravity, Urine 1.020  1.005 - 1.030    pH 5.5  5.0 - 8.0    Glucose, UA NEGATIVE  NEGATIVE (mg/dL)   Hgb urine dipstick NEGATIVE  NEGATIVE    Bilirubin Urine MODERATE (*) NEGATIVE    Ketones, ur >80 (*) NEGATIVE (mg/dL)   Protein, ur NEGATIVE  NEGATIVE (mg/dL)   Urobilinogen, UA 0.2  0.0 - 1.0 (mg/dL)   Nitrite NEGATIVE  NEGATIVE    Leukocytes, UA NEGATIVE  NEGATIVE   POCT I-STAT, CHEM 8      Component Value Range   Sodium 142  135 - 145 (mEq/L)   Potassium 3.6  3.5 - 5.1 (mEq/L)   Chloride 102  96 - 112 (mEq/L)   BUN 11  6 - 23 (mg/dL)   Creatinine, Ser 1.61  0.50 - 1.35 (mg/dL)   Glucose, Bld 93  70 - 99 (mg/dL)   Calcium, Ion 0.96  0.45 - 1.32 (mmol/L)   TCO2 25  0 - 100 (mmol/L)   Hemoglobin 17.3 (*) 13.0 - 17.0 (g/dL)   HCT 40.9  81.1 - 91.4 (%)   Dg Chest 2 View  04/29/2012  *RADIOLOGY REPORT*  Clinical Data: Recent diagnosis of pneumonia, follow-up  CHEST - 2 VIEW  Comparison: Chest x-ray of 04/22/2012  Findings: The lungs are clear.  No pneumonia is seen.  Mediastinal contours appear normal.  The heart is within normal limits in size. No bony abnormality is seen.  IMPRESSION: No active lung disease.  Original Report Authenticated By: Juline Patch, M.D.   Dg Chest 2 View  04/22/2012  *RADIOLOGY REPORT*  Clinical Data: Cough with fever and headaches for 2 days.  CHEST - 2 VIEW  Comparison: 10/03/2006.  Findings: Normal heart size with slight hyperinflation. Faint opacity right lower lung zones, likely right lower lobe, suggests early pneumonia.  Mild peribronchial thickening.  No  effusion or pneumothorax.   Bones are negative.  Similar appearance to priors.  IMPRESSION: Slight hyperinflation. Cannot exclude early right lower lobe infiltrate.  Mild peribronchial thickening.  Original Report Authenticated By: Elsie Stain, M.D.   Dg Chest Portable 1 View  05/01/2012  *RADIOLOGY REPORT*  Clinical Data: Fever, nausea/vomiting, cough  PORTABLE CHEST - 1 VIEW  Comparison: 04/29/2012  Findings: Lungs are clear. No pleural effusion or pneumothorax.  Cardiomediastinal silhouette is within normal limits.  IMPRESSION: No evidence of acute cardiopulmonary disease.  Original Report Authenticated By: Charline Bills, M.D.    9:34 PM Chest x-ray shows no pneumonia, but WBC is elevated at 19,000 and UA shows Ketones 80, a sign of dehydration.  Advised admission for acute bronchitis that has failed outpatient treatment, and dehydration.    1. Acute bronchitis   2. Dehydration   3. Down syndrome      9:50 PM Case discussed with Dr. Onalee Hua.  She requests that blood cultures be done, then Rx with Vanc and Zosyn.  Admit to [facility], Team 7, Dr. Donna Bernard.  Carleene Cooper III, MD 05/01/12 2155

## 2012-05-01 NOTE — Progress Notes (Signed)
ANTIBIOTIC CONSULT NOTE - INITIAL  Pharmacy Consult for Vancocin/Zosyn Indication: pneumonia  No Known Allergies  Patient Measurements: Weight: 49kg  Vital Signs: Temp: 99.8 F (37.7 C) (06/08 2239) Temp src: Oral (06/08 2239) BP: 101/68 mmHg (06/08 2245) Pulse Rate: 123  (06/08 2245)  Labs:  Basename 05/01/12 1858 05/01/12 1832 04/29/12 1518  WBC -- 19.8* 16.3*  HGB 17.3* 16.8 18.2*  PLT -- 222 254  LABCREA -- -- --  CREATININE 1.20 -- --   Microbiology: No results found for this or any previous visit (from the past 720 hour(s)).  Medical History: Past Medical History  Diagnosis Date  . Allergy   . Hypothyroidism   . Legg-Calve-Perthes disease   . Sleep apnea   . Down's syndrome   . History of pneumonia     multiple hospitalizions as toddler, but 10 day stay age 52yo  . Rash     hospitalized as a child, etiology latent viral exanthem  . Viral syndrome 09/1995    hospital viral syndrome.  . Chronic serous otitis media   . Scoliosis 6/99    leg length discrepancy.  . Finger fracture 2009    h/o    Medications:  Prescriptions prior to admission  Medication Sig Dispense Refill  . acetaminophen-codeine 120-12 MG/5ML suspension Take 5 mLs by mouth every 8 (eight) hours as needed.      Marland Kitchen ibuprofen (ADVIL,MOTRIN) 200 MG tablet Take 800 mg by mouth every 8 (eight) hours as needed. For pain      . levothyroxine (SYNTHROID, LEVOTHROID) 88 MCG tablet Take 88 mcg by mouth daily.        Marland Kitchen moxifloxacin (AVELOX) 400 MG tablet Take 400 mg by mouth daily.      . ondansetron (ZOFRAN-ODT) 4 MG disintegrating tablet Take 4 mg by mouth every 8 (eight) hours as needed.       Scheduled:    . sodium chloride   Intravenous Once  . levothyroxine  88 mcg Oral Q0600  . piperacillin-tazobactam (ZOSYN)  IV  3.375 g Intravenous Once  . sodium chloride  1,000 mL Intravenous Once  . vancomycin  1,000 mg Intravenous Once   Assessment: 20yo male with recent CAP treated as outpt with  completed 10-day course of Avelox, initially improved but now c/o progressive cough, chills, aches, N/V/D, CXR currently clear, to begin IV ABX for presumed PNA treatment failure with possible early sepsis.  Goal of Therapy:  Vancomycin trough level 15-20 mcg/ml  Plan:  Rec'd vanc 1g and Zosyn 3.375g IV in ED; will continue with vancomycin 500mg  IV Q12H and Zosyn 3.375g IV Q8H and monitor CBC, Cx, levels prn.  Colleen Can PharmD BCPS 05/01/2012,11:23 PM

## 2012-05-01 NOTE — ED Notes (Signed)
A week ago Thursday dx. With pna and given avelox;Completed medication today.  Has not taking codiene for x 2 days b/c coughing so hard; Now having n/v/d.  Primary did a CXR and showed - normal cxr and is dehydrated. Since then, failure to thrive.Intermittent fever.

## 2012-05-01 NOTE — ED Notes (Signed)
Got pt in a gown and all hooked up and ready for an ECG just incase. 6:50pm JG.

## 2012-05-01 NOTE — ED Notes (Signed)
Gave pt a urine cup with a label and a urinal to get a sample. But mom expressed he had already went. So I asked if he could go as soon as he could.6:52pm, JG.

## 2012-05-01 NOTE — ED Notes (Signed)
Lowella Bandy RN made aware that Admitting MD just left room 10-15 minutes ago.  Temporary orders in EPIC. RN made aware.

## 2012-05-01 NOTE — H&P (Signed)
PCP:   Carollee Herter, MD, MD   Chief Complaint:  Not feeling well and not eating well  HPI: 21 year old male with history of Down syndrome, hypothyroidism and obstructive sleep apnea who was recently treated for community-acquired pneumonia last week and just completed a course of Avelox for 10 days and did improve initially with antibiotics but however over the last week has had progressive worsening cough, chills, body aches and and decreased by mouth intake. His mom provides most of the history. She says has not been eating and drinking very well. He did have some nausea vomiting and diarrhea over the last several days which has seemed to improve somewhat. He denies any chest pain or abdominal pain but has been having some shortness of breath. He is very dehydrated. He has not recently been on any steroids. Otherwise patient is sitting in the emergency room and resting comfortably and in no distress but does appear very dehydrated.  Review of Systems:  Otherwise negative  Past Medical History: Past Medical History  Diagnosis Date  . Allergy   . Hypothyroidism   . Legg-Calve-Perthes disease   . Sleep apnea   . Down's syndrome   . History of pneumonia     multiple hospitalizions as toddler, but 10 day stay age 34yo  . Rash     hospitalized as a child, etiology latent viral exanthem  . Viral syndrome 09/1995    hospital viral syndrome.  . Chronic serous otitis media   . Scoliosis 6/99    leg length discrepancy.  . Finger fracture 2009    h/o   Past Surgical History  Procedure Date  . Hip arthroplasty     due to Legg-Calve-Perthe dz and subsequent surgery to remove hardware  . Tympanostomy tube placement   . Gum surgery     graft to gums from roof of mouth.    Medications: Prior to Admission medications   Medication Sig Start Date End Date Taking? Authorizing Provider  acetaminophen-codeine 120-12 MG/5ML suspension Take 5 mLs by mouth every 8 (eight) hours as  needed. 04/23/12 05/03/12 Yes Sunnie Nielsen, MD  ibuprofen (ADVIL,MOTRIN) 200 MG tablet Take 800 mg by mouth every 8 (eight) hours as needed. For pain   Yes Historical Provider, MD  levothyroxine (SYNTHROID, LEVOTHROID) 88 MCG tablet Take 88 mcg by mouth daily.     Yes Historical Provider, MD  moxifloxacin (AVELOX) 400 MG tablet Take 400 mg by mouth daily. 04/23/12 05/03/12 Yes Sunnie Nielsen, MD  ondansetron (ZOFRAN-ODT) 4 MG disintegrating tablet Take 4 mg by mouth every 8 (eight) hours as needed. 04/29/12 05/06/12 Yes Joselyn Arrow, MD    Allergies:  No Known Allergies  Social History:  reports that he has never smoked. He does not have any smokeless tobacco history on file. He reports that he does not drink alcohol or use illicit drugs.  Family History: Family History  Problem Relation Age of Onset  . Irritable bowel syndrome Mother   . Interstitial cystitis Mother   . Gallbladder disease Father     gallbladder surgery  . Migraines Sister   . Alzheimer's disease Maternal Grandmother     dementia.    Physical Exam: Filed Vitals:   05/01/12 2230 05/01/12 2239 05/01/12 2243 05/01/12 2245  BP: 109/69  109/69 101/68  Pulse: 126  125 123  Temp:  99.8 F (37.7 C)    TempSrc:  Oral    Resp: 21  26 20   SpO2: 97%  97% 97%   General  appearance: alert, cooperative and no distress clinically dry appearing Neck: no carotid bruit, no JVD and supple, symmetrical, trachea midline Lungs: clear to auscultation bilaterally Heart: tachycardic, rr, no r/r/g Abdomen: soft, non-tender; bowel sounds normal; no masses,  no organomegaly Extremities: extremities normal, atraumatic, no cyanosis or edema Pulses: 2+ and symmetric Skin: Skin color, texture, turgor normal. No rashes or lesions Neurologic: Grossly normal    Labs on Admission:   [facility] 05/01/12 1858  NA 142  K 3.6  CL 102  CO2 --  GLUCOSE 93  BUN 11  CREATININE 1.20  CALCIUM --  MG --  PHOS --    Basename 05/01/12 1858 05/01/12  1832 04/29/12 1518  WBC -- 19.8* 16.3*  NEUTROABS -- 17.0* 14.7*  HGB 17.3* 16.8 --  HCT 51.0 48.3 --  MCV -- 91.3 94.8  PLT -- 222 254    Radiological Exams on Admission: Dg Chest 2 View  04/29/2012  *RADIOLOGY REPORT*  Clinical Data: Recent diagnosis of pneumonia, follow-up  CHEST - 2 VIEW  Comparison: Chest x-ray of 04/22/2012  Findings: The lungs are clear.  No pneumonia is seen.  Mediastinal contours appear normal.  The heart is within normal limits in size. No bony abnormality is seen.  IMPRESSION: No active lung disease.  Original Report Authenticated By: Juline Patch, M.D.   Dg Chest 2 View  04/22/2012  *RADIOLOGY REPORT*  Clinical Data: Cough with fever and headaches for 2 days.  CHEST - 2 VIEW  Comparison: 10/03/2006.  Findings: Normal heart size with slight hyperinflation. Faint opacity right lower lung zones, likely right lower lobe, suggests early pneumonia.  Mild peribronchial thickening.  No effusion or pneumothorax.   Bones are negative.  Similar appearance to priors.  IMPRESSION: Slight hyperinflation. Cannot exclude early right lower lobe infiltrate.  Mild peribronchial thickening.  Original Report Authenticated By: Elsie Stain, M.D.   Dg Chest Portable 1 View  05/01/2012  *RADIOLOGY REPORT*  Clinical Data: Fever, nausea/vomiting, cough  PORTABLE CHEST - 1 VIEW  Comparison: 04/29/2012  Findings: Lungs are clear. No pleural effusion or pneumothorax.  Cardiomediastinal silhouette is within normal limits.  IMPRESSION: No evidence of acute cardiopulmonary disease.  Original Report Authenticated By: Charline Bills, M.D.    Assessment/Plan Present on Admission:  21 year old male with probable failed outpatient treatment pneumonia and early sepsis  .Down's syndrome .PNA (pneumonia) .Dehydration .Hypothyroidism  Place the patient on vancomycin and Zosyn blood cultures have been obtained we'll also obtain sputum culture will be bolused a liter of IV fluids aggressively  hydrate overnight. We'll provide her with some Robitussin for his cough. We'll check a lactic acid level and a pro calcitonin level. Monitor closely on telemetry. He is tachycardic but otherwise vital signs are normal and O2 sats are normal. Chest x-ray is actually negative. We'll repeat this also in the morning.   Hamsini Verrilli A [phone] 05/01/2012, 10:51 PM

## 2012-05-01 NOTE — ED Notes (Signed)
Pt coughing up blood tinged sputum. Dr. Ignacia Palma made aware.

## 2012-05-01 NOTE — ED Notes (Signed)
A week ago Thursday dx. With pna and given avelox; took last dose this am. Has not taking codiene for x 2 days b/c coughing so hard; started with n/v/d. pcp cxr Thursday - normal cxr and is dehydrated. Since then, failure to thrive. Feverish off/on.

## 2012-05-01 NOTE — ED Notes (Signed)
Family at bedside. 

## 2012-05-02 ENCOUNTER — Inpatient Hospital Stay (HOSPITAL_COMMUNITY): Payer: BC Managed Care – PPO

## 2012-05-02 DIAGNOSIS — E860 Dehydration: Secondary | ICD-10-CM

## 2012-05-02 DIAGNOSIS — J159 Unspecified bacterial pneumonia: Secondary | ICD-10-CM

## 2012-05-02 DIAGNOSIS — R112 Nausea with vomiting, unspecified: Secondary | ICD-10-CM

## 2012-05-02 DIAGNOSIS — Q909 Down syndrome, unspecified: Secondary | ICD-10-CM

## 2012-05-02 LAB — DIFFERENTIAL
Basophils Absolute: 0 10*3/uL (ref 0.0–0.1)
Eosinophils Relative: 0 % (ref 0–5)
Lymphs Abs: 1.2 10*3/uL (ref 0.7–4.0)
Monocytes Absolute: 0.8 10*3/uL (ref 0.1–1.0)
Monocytes Relative: 6 % (ref 3–12)
Neutro Abs: 11.4 10*3/uL — ABNORMAL HIGH (ref 1.7–7.7)

## 2012-05-02 LAB — CBC
HCT: 39 % (ref 39.0–52.0)
MCH: 31.8 pg (ref 26.0–34.0)
MCV: 91.1 fL (ref 78.0–100.0)
Platelets: 169 10*3/uL (ref 150–400)
RDW: 13.5 % (ref 11.5–15.5)
WBC: 13.4 10*3/uL — ABNORMAL HIGH (ref 4.0–10.5)

## 2012-05-02 LAB — COMPREHENSIVE METABOLIC PANEL
ALT: 7 U/L (ref 0–53)
AST: 13 U/L (ref 0–37)
Albumin: 2.8 g/dL — ABNORMAL LOW (ref 3.5–5.2)
Alkaline Phosphatase: 62 U/L (ref 39–117)
BUN: 8 mg/dL (ref 6–23)
Chloride: 109 mEq/L (ref 96–112)
Potassium: 3.8 mEq/L (ref 3.5–5.1)
Sodium: 141 mEq/L (ref 135–145)
Total Bilirubin: 0.7 mg/dL (ref 0.3–1.2)
Total Protein: 5.9 g/dL — ABNORMAL LOW (ref 6.0–8.3)

## 2012-05-02 LAB — EXPECTORATED SPUTUM ASSESSMENT W GRAM STAIN, RFLX TO RESP C

## 2012-05-02 LAB — STREP PNEUMONIAE URINARY ANTIGEN: Strep Pneumo Urinary Antigen: NEGATIVE

## 2012-05-02 LAB — LEGIONELLA ANTIGEN, URINE: Legionella Antigen, Urine: NEGATIVE

## 2012-05-02 MED ORDER — FLUCONAZOLE 100MG IVPB
100.0000 mg | INTRAVENOUS | Status: DC
  Administered 2012-05-03 – 2012-05-04 (×2): 100 mg via INTRAVENOUS
  Filled 2012-05-02 (×2): qty 50

## 2012-05-02 MED ORDER — ACETAMINOPHEN 325 MG PO TABS
650.0000 mg | ORAL_TABLET | Freq: Four times a day (QID) | ORAL | Status: DC | PRN
  Administered 2012-05-02: 650 mg via ORAL
  Filled 2012-05-02: qty 2

## 2012-05-02 MED ORDER — IBUPROFEN 400 MG PO TABS
400.0000 mg | ORAL_TABLET | Freq: Once | ORAL | Status: AC
  Administered 2012-05-02: 400 mg via ORAL
  Filled 2012-05-02: qty 1

## 2012-05-02 MED ORDER — FLUCONAZOLE IN SODIUM CHLORIDE 200-0.9 MG/100ML-% IV SOLN
200.0000 mg | Freq: Once | INTRAVENOUS | Status: AC
  Administered 2012-05-02: 200 mg via INTRAVENOUS
  Filled 2012-05-02: qty 100

## 2012-05-02 MED ORDER — NYSTATIN 100000 UNIT/ML MT SUSP
5.0000 mL | Freq: Four times a day (QID) | OROMUCOSAL | Status: DC
  Administered 2012-05-02 – 2012-05-04 (×10): 500000 [IU] via ORAL
  Filled 2012-05-02 (×12): qty 5

## 2012-05-02 MED ORDER — ENOXAPARIN SODIUM 40 MG/0.4ML ~~LOC~~ SOLN
40.0000 mg | SUBCUTANEOUS | Status: DC
  Administered 2012-05-02 – 2012-05-03 (×2): 40 mg via SUBCUTANEOUS
  Filled 2012-05-02 (×4): qty 0.4

## 2012-05-02 MED ORDER — WHITE PETROLATUM GEL
Status: AC
  Filled 2012-05-02: qty 5

## 2012-05-02 MED ORDER — SODIUM CHLORIDE 0.9 % IV SOLN
INTRAVENOUS | Status: DC
  Administered 2012-05-02 – 2012-05-03 (×2): via INTRAVENOUS

## 2012-05-02 MED ORDER — PHENOL 1.4 % MT LIQD
1.0000 | OROMUCOSAL | Status: DC | PRN
  Administered 2012-05-02: 1 via OROMUCOSAL
  Filled 2012-05-02: qty 177

## 2012-05-02 NOTE — Progress Notes (Signed)
MD notified that patient complained of pain in head and chest with inspiration with no radiation as well as patient unable to swallow regular diet due to throat irritation.  He was able to tolerate soft foods.  VS showed Fever of 101.5 and tachycardia.  Patient had bouts of coughing 1x weeks and spitting up mucous so it was thought by MD that the patient's pain was related to PNA diagnosis.  Tylenol ordered, and robutussin given.  Will continue to monitor patient progress.

## 2012-05-02 NOTE — Progress Notes (Signed)
PATIENT DETAILS Name: Ronald Le Age: 21 y.o. Sex: male Date of Birth: [birth year] Admit Date: 05/01/2012 AVW:[id number],BJY A, MD, MD  Subjective: Admitted with dehydration, fever and cough. Just finished a 10 day course of avelox.  Objective: Vital signs in last 24 hours: Filed Vitals:   05/02/12 0000 05/02/12 0028 05/02/12 0352 05/02/12 0526  BP: 104/57   102/60  Pulse: 114   80  Temp: 99.5 F (37.5 C) 101.5 F (38.6 C) 99.9 F (37.7 C) 98.2 F (36.8 C)  TempSrc: Axillary Oral Oral Oral  Resp: 18   18  Height:      Weight:      SpO2: 97%   98%    Weight change:   Body mass index is 21.04 kg/(m^2).  Intake/Output from previous day:  Intake/Output Summary (Last 24 hours) at 05/02/12 1126 Last data filed at 05/02/12 0600  Gross per 24 hour  Intake 1020.83 ml  Output    250 ml  Net 770.83 ml    PHYSICAL EXAM: Gen Exam: Awake and alert-not in distress Neck: Supple, No JVD.   Oral Cavity-thrush, numerous crusted lesions Chest: B/L Clear.   CVS: S1 S2 Regular, no murmurs.  Abdomen: soft, BS +, non tender, non distended.  Extremities: no edema, lower extremities warm to touch. Neurologic: Non Focal.   Skin: No Rash.   Wounds: N/A.    CONSULTS:  None  LAB RESULTS: CBC  Lab 05/02/12 0115 05/01/12 1858 05/01/12 1832 04/29/12 1518  WBC 13.4* -- 19.8* 16.3*  HGB 13.6 17.3* 16.8 18.2*  HCT 39.0 51.0 48.3 54.3*  PLT 169 -- 222 254  MCV 91.1 -- 91.3 94.8  MCH 31.8 -- 31.8 31.9  MCHC 34.9 -- 34.8 33.6  RDW 13.5 -- 13.3 12.6  LYMPHSABS 1.2 -- 1.4 0.7  MONOABS 0.8 -- 1.3* 0.8  EOSABS 0.0 -- 0.1 --  BASOSABS 0.0 -- 0.0 --  BANDABS -- -- -- --    Chemistries   Lab 05/02/12 0720 05/01/12 1858  NA 141 142  K 3.8 3.6  CL 109 102  CO2 22 --  GLUCOSE 102* 93  BUN 8 11  CREATININE 1.01 1.20  CALCIUM 8.2* --  MG -- --    CBG: No results found for this basename: GLUCAP:5 in the last 168 hours  GFR Estimated Creatinine Clearance: 83.3 ml/min (by  C-G formula based on Cr of 1.01).  Coagulation profile No results found for this basename: INR:5,PROTIME:5 in the last 168 hours  Cardiac Enzymes No results found for this basename: CK:3,CKMB:3,TROPONINI:3,MYOGLOBIN:3 in the last 168 hours  No components found with this basename: POCBNP:3 No results found for this basename: DDIMER:2 in the last 72 hours No results found for this basename: HGBA1C:2 in the last 72 hours No results found for this basename: CHOL:2,HDL:2,LDLCALC:2,TRIG:2,CHOLHDL:2,LDLDIRECT:2 in the last 72 hours No results found for this basename: TSH,T4TOTAL,FREET3,T3FREE,THYROIDAB in the last 72 hours No results found for this basename: VITAMINB12:2,FOLATE:2,FERRITIN:2,TIBC:2,IRON:2,RETICCTPCT:2 in the last 72 hours No results found for this basename: LIPASE:2,AMYLASE:2 in the last 72 hours  Urine Studies No results found for this basename: UACOL:2,UAPR:2,USPG:2,UPH:2,UTP:2,UGL:2,UKET:2,UBIL:2,UHGB:2,UNIT:2,UROB:2,ULEU:2,UEPI:2,UWBC:2,URBC:2,UBAC:2,CAST:2,CRYS:2,UCOM:2,BILUA:2 in the last 72 hours  MICROBIOLOGY: Recent Results (from the past 240 hour(s))  CULTURE, EXPECTORATED SPUTUM-ASSESSMENT     Status: Normal   Collection Time   05/02/12  2:22 AM      Component Value Range Status Comment   Specimen Description SPUTUM   Final    Special Requests NONE   Final    Sputum evaluation  Final    Value: THIS SPECIMEN IS ACCEPTABLE. RESPIRATORY CULTURE REPORT TO FOLLOW.   Report Status 05/02/2012 FINAL   Final     RADIOLOGY STUDIES/RESULTS: Dg Chest 2 View  05/02/2012  *RADIOLOGY REPORT*  Clinical Data: Pneumonia and sepsis.  CHEST - 2 VIEW  Comparison: 05/01/2012  Findings: Two views of the chest were obtained.  The lungs are clear bilaterally. Heart and mediastinum are within normal limits. The trachea is midline.  No evidence for pleural effusions.  There are distended loops of small bowel in the upper abdomen.  IMPRESSION: No acute chest findings.  Gas-filled loops of  small bowel in the upper abdomen.  Original Report Authenticated By: Richarda Overlie, M.D.   Dg Chest 2 View  04/29/2012  *RADIOLOGY REPORT*  Clinical Data: Recent diagnosis of pneumonia, follow-up  CHEST - 2 VIEW  Comparison: Chest x-ray of 04/22/2012  Findings: The lungs are clear.  No pneumonia is seen.  Mediastinal contours appear normal.  The heart is within normal limits in size. No bony abnormality is seen.  IMPRESSION: No active lung disease.  Original Report Authenticated By: Juline Patch, M.D.   Dg Chest 2 View  04/22/2012  *RADIOLOGY REPORT*  Clinical Data: Cough with fever and headaches for 2 days.  CHEST - 2 VIEW  Comparison: 10/03/2006.  Findings: Normal heart size with slight hyperinflation. Faint opacity right lower lung zones, likely right lower lobe, suggests early pneumonia.  Mild peribronchial thickening.  No effusion or pneumothorax.   Bones are negative.  Similar appearance to priors.  IMPRESSION: Slight hyperinflation. Cannot exclude early right lower lobe infiltrate.  Mild peribronchial thickening.  Original Report Authenticated By: Elsie Stain, M.D.   Dg Chest Portable 1 View  05/01/2012  *RADIOLOGY REPORT*  Clinical Data: Fever, nausea/vomiting, cough  PORTABLE CHEST - 1 VIEW  Comparison: 04/29/2012  Findings: Lungs are clear. No pleural effusion or pneumothorax.  Cardiomediastinal silhouette is within normal limits.  IMPRESSION: No evidence of acute cardiopulmonary disease.  Original Report Authenticated By: Charline Bills, M.D.    MEDICATIONS: Scheduled Meds:    . sodium chloride   Intravenous Once  . fluconazole (DIFLUCAN) IV  100 mg Intravenous Q24H  . fluconazole (DIFLUCAN) IV  200 mg Intravenous Once  . ibuprofen  400 mg Oral Once  . levothyroxine  88 mcg Oral Q0600  . nystatin  5 mL Oral QID  . piperacillin-tazobactam (ZOSYN)  IV  3.375 g Intravenous Once  . piperacillin-tazobactam (ZOSYN)  IV  3.375 g Intravenous Q8H  . sodium chloride  1,000 mL Intravenous  Once  . vancomycin  500 mg Intravenous Q12H  . vancomycin  1,000 mg Intravenous Once   Continuous Infusions:    . 0.9 % NaCl with KCl 20 mEq / L 125 mL/hr at 05/02/12 0102  . DISCONTD: sodium chloride 160 mL/hr at 05/01/12 2213  . DISCONTD: sodium chloride     PRN Meds:.acetaminophen, guaifenesin, phenol  Antibiotics: Anti-infectives     Start     Dose/Rate Route Frequency Ordered Stop   05/03/12 1000   fluconazole (DIFLUCAN) IVPB 100 mg        100 mg 50 mL/hr over 60 Minutes Intravenous Every 24 hours 05/02/12 1003     05/02/12 1100   fluconazole (DIFLUCAN) IVPB 200 mg        200 mg 100 mL/hr over 60 Minutes Intravenous  Once 05/02/12 1003     05/02/12 1000   vancomycin (VANCOCIN) 500 mg in sodium chloride 0.9 %  100 mL IVPB        500 mg 100 mL/hr over 60 Minutes Intravenous Every 12 hours 05/01/12 2330     05/02/12 0600   piperacillin-tazobactam (ZOSYN) IVPB 3.375 g        3.375 g 12.5 mL/hr over 240 Minutes Intravenous Every 8 hours 05/01/12 2330     05/01/12 2200   piperacillin-tazobactam (ZOSYN) IVPB 3.375 g        3.375 g 12.5 mL/hr over 240 Minutes Intravenous  Once 05/01/12 2154 05/02/12 0213   05/01/12 2200   vancomycin (VANCOCIN) IVPB 1000 mg/200 mL premix        1,000 mg 200 mL/hr over 60 Minutes Intravenous  Once 05/01/12 2155 05/01/12 2313          Assessment/Plan: Principal Problem: 1. Fever, Cough -just finished a 10 course of Avelox -however still coughing -CXR done on admission and today-not showing any PNA -given fever and persistent cough-will get a CT Chest to make sure if PNA or not -await blood cultures UA-no UTI -continue with empiric Vanco/Zosyn for now-till further cultures/radiology studies are available  2.Diarrhea/Vomiting -none since Last Thursday -doubt CDiff-as per patient's mother-stools not loose -since no diarrhea patient has had no BM since admit-d/c C Diff PCR  3. Oral thrush -having difficulty swallowing as  well-?likely esophageal candidiasis -has some crusted lesions in the lips as well -start Fluconazole and Nystatin  4. Hypothyroidism -continue with Levothyroxine  5. H/o Down's Syndrome  Disposition: Remain inpatient  DVT Prophylaxis: Start Prophylactic Lovenox  Code Status: Full Code  Jeoffrey Massed, MD  [facility] Pager [phone]  If 7PM-7AM, please contact night-coverage [email or web address] Password [email or web address] 05/02/2012, 11:26 AM   LOS: 1 day

## 2012-05-02 NOTE — Progress Notes (Signed)
Pt orientation to unit, room and routine. Information packet given to patient/family. Armband ID verified with patient/family, and in place. Up with assist, fall risk assessment complete with family verbalizing understanding of risks associated with falls. Family verbalizes and patient gestures an understanding of how to use the call bell and to call for help before getting out of bed. Skin, clean-dry- intact without evidence of bruising, or skin tears. No evidence of skin break down noted on exam. Pt from home with family with plans to return home when medically cleared. POC hydration and antibiotic administration.  Will cont to monitor and assist as needed.

## 2012-05-03 DIAGNOSIS — R112 Nausea with vomiting, unspecified: Secondary | ICD-10-CM

## 2012-05-03 DIAGNOSIS — Q909 Down syndrome, unspecified: Secondary | ICD-10-CM

## 2012-05-03 DIAGNOSIS — E860 Dehydration: Secondary | ICD-10-CM

## 2012-05-03 DIAGNOSIS — B3781 Candidal esophagitis: Secondary | ICD-10-CM | POA: Diagnosis present

## 2012-05-03 DIAGNOSIS — J159 Unspecified bacterial pneumonia: Secondary | ICD-10-CM

## 2012-05-03 LAB — CBC
HCT: 40.5 % (ref 39.0–52.0)
Hemoglobin: 14.5 g/dL (ref 13.0–17.0)
MCV: 90.2 fL (ref 78.0–100.0)
RBC: 4.49 MIL/uL (ref 4.22–5.81)
WBC: 10.7 10*3/uL — ABNORMAL HIGH (ref 4.0–10.5)

## 2012-05-03 LAB — BASIC METABOLIC PANEL
BUN: 4 mg/dL — ABNORMAL LOW (ref 6–23)
CO2: 24 mEq/L (ref 19–32)
Chloride: 107 mEq/L (ref 96–112)
Creatinine, Ser: 0.86 mg/dL (ref 0.50–1.35)
GFR calc Af Amer: >90 mL/min (ref >90)
Potassium: 3.4 mEq/L — ABNORMAL LOW (ref 3.5–5.1)

## 2012-05-03 LAB — DIFFERENTIAL
Eosinophils Absolute: 0.2 10*3/uL (ref 0.0–0.7)
Eosinophils Relative: 1 % (ref 0–5)
Lymphocytes Relative: 12 % (ref 12–46)
Lymphs Abs: 1.3 10*3/uL (ref 0.7–4.0)
Monocytes Relative: 7 % (ref 3–12)

## 2012-05-03 MED ORDER — SODIUM CHLORIDE 0.9 % IV SOLN
INTRAVENOUS | Status: DC
  Administered 2012-05-03: 16:00:00 via INTRAVENOUS
  Filled 2012-05-03 (×2): qty 1000

## 2012-05-03 MED ORDER — ONDANSETRON HCL 4 MG/2ML IJ SOLN
4.0000 mg | Freq: Four times a day (QID) | INTRAMUSCULAR | Status: DC | PRN
  Administered 2012-05-03: 4 mg via INTRAVENOUS
  Filled 2012-05-03: qty 2

## 2012-05-03 MED ORDER — BOOST / RESOURCE BREEZE PO LIQD
1.0000 | Freq: Two times a day (BID) | ORAL | Status: DC
  Administered 2012-05-04 (×2): 1 via ORAL

## 2012-05-03 NOTE — Progress Notes (Signed)
INITIAL ADULT NUTRITION ASSESSMENT Date: 05/03/2012   Time: 3:34 PM  Reason for Assessment: Nutrition Risk Report  ASSESSMENT: Male 21 y.o.  Dx: PNA (pneumonia)  Hx:  Past Medical History  Diagnosis Date  . Allergy   . Hypothyroidism   . Legg-Calve-Perthes disease   . Sleep apnea   . Down's syndrome   . History of pneumonia     multiple hospitalizions as toddler, but 10 day stay age 49yo  . Rash     hospitalized as a child, etiology latent viral exanthem  . Viral syndrome 09/1995    hospital viral syndrome.  . Chronic serous otitis media   . Scoliosis 6/99    leg length discrepancy.  . Finger fracture 2009    h/o   Past Surgical History  Procedure Date  . Hip arthroplasty     due to Legg-Calve-Perthe dz and subsequent surgery to remove hardware  . Tympanostomy tube placement   . Gum surgery     graft to gums from roof of mouth.   Related Meds:  Scheduled Meds:   . enoxaparin (LOVENOX) injection  40 mg Subcutaneous Q24H  . fluconazole (DIFLUCAN) IV  100 mg Intravenous Q24H  . levothyroxine  88 mcg Oral Q0600  . nystatin  5 mL Oral QID  . white petrolatum      . DISCONTD: piperacillin-tazobactam (ZOSYN)  IV  3.375 g Intravenous Q8H  . DISCONTD: vancomycin  500 mg Intravenous Q12H   Continuous Infusions:   . sodium chloride 0.9 % 1,000 mL with potassium chloride 40 mEq infusion    . DISCONTD: sodium chloride 50 mL/hr at 05/03/12 0202   PRN Meds:.acetaminophen, guaifenesin, ondansetron (ZOFRAN) IV, phenol  Ht: 5\' 1"  (154.9 cm)  Wt: 111 lb 5.3 oz (50.5 kg)  Ideal Wt: 50.9 kg % Ideal Wt: 99%  Wt Readings from Last 15 Encounters:  05/01/12 111 lb 5.3 oz (50.5 kg)  04/29/12 108 lb (48.988 kg)  10/15/11 124 lb (56.246 kg)  Usual Wt: 125 lb % Usual Wt: 89%  Body mass index is 21.04 kg/(m^2). Weight is WNL.  Food/Nutrition Related Hx: poor PO intake x 2 weeks  Labs:  CMP     Component Value Date/Time   NA 142 05/03/2012 0515   K 3.4* 05/03/2012 0515     CL 107 05/03/2012 0515   CO2 24 05/03/2012 0515   GLUCOSE 92 05/03/2012 0515   BUN 4* 05/03/2012 0515   CREATININE 0.86 05/03/2012 0515   CALCIUM 8.6 05/03/2012 0515   PROT 5.9* 05/02/2012 0720   ALBUMIN 2.8* 05/02/2012 0720   AST 13 05/02/2012 0720   ALT 7 05/02/2012 0720   ALKPHOS 62 05/02/2012 0720   BILITOT 0.7 05/02/2012 0720   GFRNONAA >90 05/03/2012 0515   GFRAA >90 05/03/2012 0515     Intake/Output Summary (Last 24 hours) at 05/03/12 1535 Last data filed at 05/03/12 0601  Gross per 24 hour  Intake 1411.67 ml  Output      0 ml  Net 1411.67 ml    Diet Order: Dysphagia 3 with thin liquids  Supplements/Tube Feeding: none  IVF:    sodium chloride 0.9 % 1,000 mL with potassium chloride 40 mEq infusion   DISCONTD: sodium chloride Last Rate: 50 mL/hr at 05/03/12 0202   Estimated Nutritional Needs:   Kcal: 1650 - 1850 kcal Protein:  55 - 65 grams protein Fluid:  1.2 - 1.5 liters daily  Hx of Down's Syndrome with recent dx of PNA. Admitted with progressive  worsening cough, chills, body aches and and decreased by mouth intake. Had n/v/d over the last several days which has improved over the past few days.  Pt requiring soft diet as he has difficulty swallowing with current throat irritation. Started on Fluconazole and Nystatin for ? Likely esophageal candidiasis in setting of recent abx use. Mom reports medication is helping with his thrush.  Usual weight is 125 lb. Pt with 11% wt loss x 2 weeks, per mom. Mom reports that pt was eating very little PTA, skipping several meals. Mom also reports that pt went at least 1 entire day without drinking anything. She states that pt has not had much to eat since hospital admission 2/2 sleepiness. Current lunch tray untouched. Pt meets criteria for severe malnutrition in the context of acute illness 2/2 <50% of estimated energy intake for at least 5 days and 12% wt loss of usual body weight x 2 weeks.  Mom reports pt usually eats Regular diet. Agreeable  to Resource Breeze PO BID for additional protein and kcal.  NUTRITION DIAGNOSIS: -Inadequate oral intake (NI-2.1).  Status: Ongoing  RELATED TO: acute illness  AS EVIDENCE BY: poor meal completion and 12% wt loss x 2 weeks.  MONITORING/EVALUATION(Goals): Goal: Pt to consume at least 75% of meals and supplements. Monitor: weights, labs, I/O's, PO intake, supplement tolerance  EDUCATION NEEDS: -No education needs identified at this time  INTERVENTION: 1. Resource Breeze PO BID for additional protein and kcal 2. Recommend checking magnesium, potassium, and phosphorus x at least 3 days and replete prn as pt may be at risk for refeeding syndrome given dx of severe acute malnutrition 3. RD to continue to follow nutrition care plan  Dietitian #: [phone]  DOCUMENTATION CODES Per approved criteria  -Severe malnutrition in the context of acute illness or injury    Adair Laundry 05/03/2012, 3:34 PM

## 2012-05-03 NOTE — Progress Notes (Signed)
Patient ID: SHREYAN HINZ, male   DOB: Apr 02, 1991, 21 y.o.   MRN: [id number]   PATIENT DETAILS Name: Ronald Le Age: 21 y.o. Sex: male Date of Birth: 05-19-1991 Admit Date: 05/01/2012 NGE:[id number],XLK A, MD, MD  Subjective: Looks a lot better than yesterday Swallowing is very painful-however is able to have some PO intake today-he was not able to do this prior to this admit  Objective: Vital signs in last 24 hours: Filed Vitals:   05/02/12 0526 05/02/12 1500 05/02/12 2150 05/03/12 0600  BP: 102/60 106/64 113/74 108/62  Pulse: 80 72 52 87  Temp: 98.2 F (36.8 C) 98.6 F (37 C) 98.5 F (36.9 C) 99.2 F (37.3 C)  TempSrc: Oral Oral Axillary Oral  Resp: 18 18 18 18   Height:      Weight:      SpO2: 98% 98% 97% 97%    Weight change:   Body mass index is 21.04 kg/(m^2).  Intake/Output from previous day:  Intake/Output Summary (Last 24 hours) at 05/03/12 1309 Last data filed at 05/03/12 0601  Gross per 24 hour  Intake 1531.67 ml  Output    300 ml  Net 1231.67 ml    PHYSICAL EXAM: Gen Exam: Awake and alert-not in distress. Grimaces in pain when swallowing Neck: Supple, No JVD.   Oral Cavity-thrush, numerous crusted lesions, Lips with some brown crust remaining. Chest: B/L Clear.   CVS: S1 S2 Regular, no murmurs.  Abdomen: soft, BS +, non tender, non distended.  Extremities: no edema, lower extremities warm to touch. Neurologic: Non Focal.      CONSULTS:  [facility], Dr. Willis Modena.  LAB RESULTS: CBC  Lab 05/03/12 0515 05/02/12 0115 05/01/12 1858 05/01/12 1832 04/29/12 1518  WBC 10.7* 13.4* -- 19.8* 16.3*  HGB 14.5 13.6 17.3* 16.8 18.2*  HCT 40.5 39.0 51.0 48.3 54.3*  PLT 163 169 -- 222 254  MCV 90.2 91.1 -- 91.3 94.8  MCH 32.3 31.8 -- 31.8 31.9  MCHC 35.8 34.9 -- 34.8 33.6  RDW 13.7 13.5 -- 13.3 12.6  LYMPHSABS 1.3 1.2 -- 1.4 0.7  MONOABS 0.7 0.8 -- 1.3* 0.8  EOSABS 0.2 0.0 -- 0.1 --  BASOSABS 0.0 0.0 -- 0.0 --  BANDABS -- -- -- -- --     Chemistries   Lab 05/03/12 0515 05/02/12 0720 05/01/12 1858  NA 142 141 142  K 3.4* 3.8 3.6  CL 107 109 102  CO2 24 22 --  GLUCOSE 92 102* 93  BUN 4* 8 11  CREATININE 0.86 1.01 1.20  CALCIUM 8.6 8.2* --  MG -- -- --     MICROBIOLOGY: Recent Results (from the past 240 hour(s))  CULTURE, BLOOD (ROUTINE X 2)     Status: Normal (Preliminary result)   Collection Time   05/01/12 10:05 PM      Component Value Range Status Comment   Specimen Description BLOOD LEFT ARM   Final    Special Requests BOTTLES DRAWN AEROBIC AND ANAEROBIC 10CC EACH   Final    Culture  Setup Time [id number]   Final    Culture     Final    Value:        BLOOD CULTURE RECEIVED NO GROWTH TO DATE CULTURE WILL BE HELD FOR 5 DAYS BEFORE ISSUING A FINAL NEGATIVE REPORT   Report Status PENDING   Incomplete   CULTURE, BLOOD (ROUTINE X 2)     Status: Normal (Preliminary result)   Collection Time   05/01/12 10:10 PM  Component Value Range Status Comment   Specimen Description BLOOD RIGHT HAND   Final    Special Requests BOTTLES DRAWN AEROBIC AND ANAEROBIC 10CC EACH   Final    Culture  Setup Time [id number]   Final    Culture     Final    Value:        BLOOD CULTURE RECEIVED NO GROWTH TO DATE CULTURE WILL BE HELD FOR 5 DAYS BEFORE ISSUING A FINAL NEGATIVE REPORT   Report Status PENDING   Incomplete   CULTURE, EXPECTORATED SPUTUM-ASSESSMENT     Status: Normal   Collection Time   05/02/12  2:22 AM      Component Value Range Status Comment   Specimen Description SPUTUM   Final    Special Requests NONE   Final    Sputum evaluation     Final    Value: THIS SPECIMEN IS ACCEPTABLE. RESPIRATORY CULTURE REPORT TO FOLLOW.   Report Status 05/02/2012 FINAL   Final   CULTURE, RESPIRATORY     Status: Normal (Preliminary result)   Collection Time   05/02/12  2:22 AM      Component Value Range Status Comment   Specimen Description SPUTUM   Final    Special Requests NONE   Final    Gram Stain PENDING   Incomplete     Culture     Final    Value: MODERATE STAPHYLOCOCCUS AUREUS     Note: RIFAMPIN AND GENTAMICIN SHOULD NOT BE USED AS SINGLE DRUGS FOR TREATMENT OF STAPH INFECTIONS.   Report Status PENDING   Incomplete     RADIOLOGY STUDIES/RESULTS:  CT Chest 05/02/12  IMPRESSION:  1. No evidence of pneumonia.  2. Suspected right lateral mid esophageal diverticulum. Diverticula in this location are typically traction based, although  given the patient's age, this could reflect a congenital or pulsion diverticulum. This could contribute to the patient's symptoms.  Further assessment with non emergent esophagram suggested.  Dg Chest 2 View  05/02/2012  *RADIOLOGY REPORT*  Clinical Data: Pneumonia and sepsis.  CHEST - 2 VIEW  Comparison: 05/01/2012  Findings: Two views of the chest were obtained.  The lungs are clear bilaterally. Heart and mediastinum are within normal limits. The trachea is midline.  No evidence for pleural effusions.  There are distended loops of small bowel in the upper abdomen.  IMPRESSION: No acute chest findings.  Gas-filled loops of small bowel in the upper abdomen.  Original Report Authenticated By: Richarda Overlie, M.D.    Dg Chest Portable 1 View  05/01/2012  *RADIOLOGY REPORT*  Clinical Data: Fever, nausea/vomiting, cough  PORTABLE CHEST - 1 VIEW  Comparison: 04/29/2012  Findings: Lungs are clear. No pleural effusion or pneumothorax.  Cardiomediastinal silhouette is within normal limits.  IMPRESSION: No evidence of acute cardiopulmonary disease.  Original Report Authenticated By: Charline Bills, M.D.    MEDICATIONS: Scheduled Meds:    . enoxaparin (LOVENOX) injection  40 mg Subcutaneous Q24H  . fluconazole (DIFLUCAN) IV  100 mg Intravenous Q24H  . levothyroxine  88 mcg Oral Q0600  . nystatin  5 mL Oral QID  . white petrolatum      . DISCONTD: piperacillin-tazobactam (ZOSYN)  IV  3.375 g Intravenous Q8H  . DISCONTD: vancomycin  500 mg Intravenous Q12H   Continuous Infusions:    .  sodium chloride 50 mL/hr at 05/03/12 0202   PRN Meds:.acetaminophen, guaifenesin, ondansetron (ZOFRAN) IV, phenol  Antibiotics: Anti-infectives     Start     Dose/Rate Route  Frequency Ordered Stop   05/03/12 1000   fluconazole (DIFLUCAN) IVPB 100 mg        100 mg 50 mL/hr over 60 Minutes Intravenous Every 24 hours 05/02/12 1003     05/02/12 1100   fluconazole (DIFLUCAN) IVPB 200 mg        200 mg 100 mL/hr over 60 Minutes Intravenous  Once 05/02/12 1003 05/02/12 1229   05/02/12 1000   vancomycin (VANCOCIN) 500 mg in sodium chloride 0.9 % 100 mL IVPB  Status:  Discontinued        500 mg 100 mL/hr over 60 Minutes Intravenous Every 12 hours 05/01/12 2330 05/03/12 1127   05/02/12 0600   piperacillin-tazobactam (ZOSYN) IVPB 3.375 g  Status:  Discontinued        3.375 g 12.5 mL/hr over 240 Minutes Intravenous Every 8 hours 05/01/12 2330 05/03/12 1127   05/01/12 2200  piperacillin-tazobactam (ZOSYN) IVPB 3.375 g       3.375 g 12.5 mL/hr over 240 Minutes Intravenous  Once 05/01/12 2154 05/02/12 0213   05/01/12 2200   vancomycin (VANCOCIN) IVPB 1000 mg/200 mL premix        1,000 mg 200 mL/hr over 60 Minutes Intravenous  Once 05/01/12 2155 05/01/12 2313          Assessment/Plan: Principal Problem: 1. Fever, Cough -etiology not clear-?related to Oral/Esophageal thrush-and resultant esophagitis -just finished a 10 course of Avelox -however still coughing -CXR -not showing any PNA -CT Chest shows no PNA, but + for mid esophageal diverticulum. -Blood Cultures show NGTD -UA-no UTI -Antibiotics(Vanco/zosyn) discontinued 6/10 as cultures are negative, no foci of infection-other than thrush evident. Monitor off antibiotics and on just Diflucan -Mid Esophageal diverticulum could contribute to coughing.  2.Diarrhea/Vomiting - resolved. -none since Last Thursday -doubt CDiff-as per patient's mother-stools not loose -since no diarrhea patient has had no BM since admit-d/c C Diff  PCR  3. Oral thrush -having difficulty swallowing as well-?likely esophageal candidiasis -has some crusted lesions in the lips as well -start Fluconazole and Nystatin.  IV fluconazole for 24 - 48 hours then PO for 14 days.  4. Hypothyroidism -continue with Levothyroxine  5. H/o Down's Syndrome  6.  Mild Hypokalemia. Replete in IVF.  Check Bmet in am.  Disposition: Remain inpatient until he is able to eat.  DVT Prophylaxis: Start Prophylactic Lovenox  Code Status: Full Code  Ronald Le [facility] Pager [phone]  If 7PM-7AM, please contact night-coverage [email or web address] Password [email or web address] 05/03/2012, 1:09 PM   LOS: 2 days    Attending -I have seen and examined the patient, I agree with the assessment and plan as outlined above. Patient looks a lot better, has started to eat some, since blood cultures negative-and apart from thrush no other foci of infection present-will stop all antibiotics and just monitor patient off antibiotics.  S Ameris Akamine

## 2012-05-03 NOTE — Consult Note (Signed)
[facility] Consultation Note  Referring Provider: Dr. Jerral Ralph [facility])  Reason for Consultation:  Dysphagia, odynophagia  HPI: Ronald Le is a 21 y.o. male with history of Down's syndrome and recent pneumonia.  Per mother, patient hasn't slept much in two days, and he is so fatigued that he can't provide much history.  After starting antibiotics for recent pneumonia, patient developed progressive nausea, odynophagia, and oral plaques + ulcerations.  Had bout of emesis of cottage cheese consistency few days ago.  CT chest recently showed mid esophageal diverticulum.  Mother tells me that [name] has had occasional episodes of chest pain after eating, but no prior odynophagia symptoms as he is currently having.  After 24 hours of oral nystatin suspension and intravenous fluconazole, his symptoms per mother have markedly improved.  Patient has never previously had an endoscopy.   Past Medical History  Diagnosis Date  . Allergy   . Hypothyroidism   . Legg-Calve-Perthes disease   . Sleep apnea   . Down's syndrome   . History of pneumonia     multiple hospitalizions as toddler, but 10 day stay age 36yo  . Rash     hospitalized as a child, etiology latent viral exanthem  . Viral syndrome 09/1995    hospital viral syndrome.  . Chronic serous otitis media   . Scoliosis 6/99    leg length discrepancy.  . Finger fracture 2009    h/o    Past Surgical History  Procedure Date  . Hip arthroplasty     due to Legg-Calve-Perthe dz and subsequent surgery to remove hardware  . Tympanostomy tube placement   . Gum surgery     graft to gums from roof of mouth.    Prior to Admission medications   Medication Sig Start Date End Date Taking? Authorizing Provider  acetaminophen-codeine 120-12 MG/5ML suspension Take 5 mLs by mouth every 8 (eight) hours as needed. 04/23/12 05/03/12 Yes Sunnie Nielsen, MD  ibuprofen (ADVIL,MOTRIN) 200 MG tablet Take 800 mg by mouth every 8 (eight) hours as  needed. For pain   Yes Historical Provider, MD  levothyroxine (SYNTHROID, LEVOTHROID) 88 MCG tablet Take 88 mcg by mouth daily.     Yes Historical Provider, MD  moxifloxacin (AVELOX) 400 MG tablet Take 400 mg by mouth daily. 04/23/12 05/03/12 Yes Sunnie Nielsen, MD  ondansetron (ZOFRAN-ODT) 4 MG disintegrating tablet Take 4 mg by mouth every 8 (eight) hours as needed. 04/29/12 05/06/12 Yes Joselyn Arrow, MD    Current Facility-Administered Medications  Medication Dose Route Frequency Provider Last Rate Last Dose  . 0.9 %  sodium chloride infusion   Intravenous Continuous Maretta Bees, MD 50 mL/hr at 05/03/12 0202    . 0.9 % NaCl with KCl 20 mEq/ L  infusion   Intravenous Continuous Haydee Monica, MD 125 mL/hr at 05/02/12 0102    . acetaminophen (TYLENOL) tablet 650 mg  650 mg Oral Q6H PRN Jinger Neighbors, NP   650 mg at 05/02/12 0102  . enoxaparin (LOVENOX) injection 40 mg  40 mg Subcutaneous Q24H Maretta Bees, MD   40 mg at 05/02/12 1315  . fluconazole (DIFLUCAN) IVPB 100 mg  100 mg Intravenous Q24H Maretta Bees, MD      . fluconazole (DIFLUCAN) IVPB 200 mg  200 mg Intravenous Once Maretta Bees, MD   200 mg at 05/02/12 1129  . guaifenesin (ROBITUSSIN) 100 MG/5ML syrup 200 mg  200 mg Oral Q4H PRN Haydee Monica, MD  200 mg at 05/03/12 0200  . levothyroxine (SYNTHROID, LEVOTHROID) tablet 88 mcg  88 mcg Oral Q0600 Haydee Monica, MD   88 mcg at 05/03/12 0528  . nystatin (MYCOSTATIN) 100000 UNIT/ML suspension 500,000 Units  5 mL Oral QID Maretta Bees, MD   500,000 Units at 05/02/12 2145  . ondansetron (ZOFRAN) injection 4 mg  4 mg Intravenous Q6H PRN Jinger Neighbors, NP   4 mg at 05/03/12 0344  . phenol (CHLORASEPTIC) mouth spray 1 spray  1 spray Mouth/Throat PRN Jinger Neighbors, NP   1 spray at 05/02/12 0539  . piperacillin-tazobactam (ZOSYN) IVPB 3.375 g  3.375 g Intravenous Q8H Colleen Can, PHARMD   3.375 g at 05/03/12 0528  . vancomycin (VANCOCIN) 500 mg in sodium chloride 0.9 %  100 mL IVPB  500 mg Intravenous Q12H Colleen Can, PHARMD   500 mg at 05/02/12 2145  . white petrolatum (VASELINE) gel             Allergies as of 05/01/2012  . (No Known Allergies)    Family History  Problem Relation Age of Onset  . Irritable bowel syndrome Mother   . Interstitial cystitis Mother   . Gallbladder disease Father     gallbladder surgery  . Migraines Sister   . Alzheimer's disease Maternal Grandmother     dementia.    History   Social History  . Marital Status: Single    Spouse Name: N/A    Number of Children: N/A  . Years of Education: N/A   Occupational History  . Not on file.   Social History Main Topics  . Smoking status: Never Smoker   . Smokeless tobacco: Not on file  . Alcohol Use: No  . Drug Use: No  . Sexually Active: Not on file   Other Topics Concern  . Not on file   Social History Narrative  . No narrative on file    Review of Systems: Unable to obtain due to patient's somnolent state  Physical Exam: Vital signs in last 24 hours: Temp:  [98.5 F (36.9 C)-99.2 F (37.3 C)] 99.2 F (37.3 C) (06/10 0600) Pulse Rate:  [52-87] 87  (06/10 0600) Resp:  [18] 18  (06/10 0600) BP: (106-113)/(62-74) 108/62 mmHg (06/10 0600) SpO2:  [97 %-98 %] 97 % (06/10 0600) Last BM Date: 05/03/12 General:   Somnolent but arousable,  Down's facies Head:  Normocephalic and atraumatic. Eyes:  Sclera clear, no icterus.   Conjunctiva pink. Ears:  Normal auditory acuity. Nose:  No deformity, discharge,  or lesions. Mouth:  Mild oral white plaques and ulcerations, much improved per mother; No other deformity or lesions.  Oropharynx pink & moist. Neck:  Supple; no masses or thyromegaly. Lungs:  Clear throughout to auscultation.   No wheezes, crackles, or rhonchi. No acute distress. Heart:  Regular rate and rhythm; no murmurs, clicks, rubs,  or gallops. Abdomen:  Soft, mild epigastric tenderness to moderate palpation. No masses, hepatosplenomegaly or  hernias noted. Normal bowel sounds, without guarding, and without rebound.     Msk:  Symmetrical without gross deformities. Normal posture. Pulses:  Normal pulses noted. Extremities:  Without clubbing or edema. Neurologic:  Alert and  oriented x4;  grossly normal neurologically. Skin:  Intact without significant lesions or rashes. Psych:  Alert and cooperative. Normal mood and affect.  Lab Results:  Basename 05/03/12 0515 05/02/12 0115 05/01/12 1858 05/01/12 1832  WBC 10.7* 13.4* -- 19.8*  HGB 14.5 13.6 17.3* --  HCT 40.5 39.0 51.0 --  PLT 163 169 -- 222   BMET  Basename 05/03/12 0515 05/02/12 0720 05/01/12 1858  NA 142 141 142  K 3.4* 3.8 3.6  CL 107 109 102  CO2 24 22 --  GLUCOSE 92 102* 93  BUN 4* 8 11  CREATININE 0.86 1.01 1.20  CALCIUM 8.6 8.2* --   LFT  Basename 05/02/12 0720 05/01/12 2216  PROT 5.9* --  ALBUMIN 2.8* --  AST 13 --  ALT 7 --  ALKPHOS 62 --  BILITOT 0.7 --  BILIDIR -- 0.1  IBILI -- 0.4   PT/INR No results found for this basename: LABPROT:2,INR:2 in the last 72 hours  Studies/Results: Dg Chest 2 View  05/02/2012  *RADIOLOGY REPORT*  Clinical Data: Pneumonia and sepsis.  CHEST - 2 VIEW  Comparison: 05/01/2012  Findings: Two views of the chest were obtained.  The lungs are clear bilaterally. Heart and mediastinum are within normal limits. The trachea is midline.  No evidence for pleural effusions.  There are distended loops of small bowel in the upper abdomen.  IMPRESSION: No acute chest findings.  Gas-filled loops of small bowel in the upper abdomen.  Original Report Authenticated By: Richarda Overlie, M.D.   Ct Chest Wo Contrast  05/02/2012  *RADIOLOGY REPORT*  Clinical Data: Oral thrush with productive cough.  Question pneumonia.  CT CHEST WITHOUT CONTRAST  Technique:  Multidetector CT imaging of the chest was performed following the standard protocol without IV contrast.  Comparison: Prior chest radiographs, most recently done today.  Findings: The lungs  are clear.  There is no evidence of pneumonia or endobronchial lesion.  There is central air cyst formation in the left lower lobe on image 28.  No enlarged mediastinal or hilar lymph nodes are identified.  There is a focal air containing collection in the posterior right mediastinum.  This measures up to 3.4 cm in cephalocaudad extent. This appears to be contiguous with the esophagus which is mildly dilated to the right.  No surrounding inflammatory change or extraluminal air collection is identified.  There is no pneumothorax or pneumo mediastinum.  The distal esophagus appears normal. There is no pleural or pericardial effusion.  The visualized upper abdomen appears normal.  IMPRESSION:  1.  No evidence of pneumonia. 2.  Suspected right lateral mid esophageal diverticulum. Diverticula in this location are typically traction based, although given the patient's age, this could reflect a congenital or pulsion diverticulum.  This could contribute to the patient's symptoms. Further assessment with non emergent esophagram suggested.  Original Report Authenticated By: Gerrianne Scale, M.D.   Dg Chest Portable 1 View  05/01/2012  *RADIOLOGY REPORT*  Clinical Data: Fever, nausea/vomiting, cough  PORTABLE CHEST - 1 VIEW  Comparison: 04/29/2012  Findings: Lungs are clear. No pleural effusion or pneumothorax.  Cardiomediastinal silhouette is within normal limits.  IMPRESSION: No evidence of acute cardiopulmonary disease.  Original Report Authenticated By: Charline Bills, M.D.   Impression:  1.  Dysphagia and odynophagia.  Suspect oral and esophageal candidiasis, in setting of recent antibiotics.  Improving with antifungal therapy. 2.  Mid esophageal diverticulum and intermittent history chest pain.  Suspect component of chronic esophageal dysmotility.  Plan:  1.   Continue intravenous fluconazole for another 24-48 hours, for total 14 day course of therapy. 2.  14-d course of nystatin oral suspension. 3.   Slowly advance diet as tolerated. 4.  If continues to improve, would not do any further evaluation; if shows relapse, consider  esophagram. 5.  If he does not end up needing esophagram as inpatient, would consider doing this as outpatient to better assess his chronic intermittent chest pain and to better visualize the size and nature of his diverticulum. 6. Case discussed with Corrie Dandy A. York, PA-C with [facility]. 7.  Will follow.  Thank you for the consult.    LOS: 2 days   Shashana Fullington M  05/03/2012, 8:56 AM

## 2012-05-04 ENCOUNTER — Telehealth: Payer: Self-pay | Admitting: Family Medicine

## 2012-05-04 DIAGNOSIS — E860 Dehydration: Secondary | ICD-10-CM

## 2012-05-04 DIAGNOSIS — R112 Nausea with vomiting, unspecified: Secondary | ICD-10-CM

## 2012-05-04 DIAGNOSIS — J159 Unspecified bacterial pneumonia: Secondary | ICD-10-CM

## 2012-05-04 DIAGNOSIS — Q909 Down syndrome, unspecified: Secondary | ICD-10-CM

## 2012-05-04 LAB — BASIC METABOLIC PANEL
BUN: 4 mg/dL — ABNORMAL LOW (ref 6–23)
CO2: 28 mEq/L (ref 19–32)
Calcium: 8.9 mg/dL (ref 8.4–10.5)
Chloride: 106 mEq/L (ref 96–112)
Creatinine, Ser: 0.86 mg/dL (ref 0.50–1.35)

## 2012-05-04 LAB — CBC
HCT: 40.7 % (ref 39.0–52.0)
MCH: 32 pg (ref 26.0–34.0)
MCV: 89.8 fL (ref 78.0–100.0)
Platelets: 189 10*3/uL (ref 150–400)
RBC: 4.53 MIL/uL (ref 4.22–5.81)
RDW: 13.3 % (ref 11.5–15.5)
WBC: 8.4 10*3/uL (ref 4.0–10.5)

## 2012-05-04 LAB — CULTURE, RESPIRATORY W GRAM STAIN

## 2012-05-04 MED ORDER — BOOST / RESOURCE BREEZE PO LIQD: 1.0000 | Freq: Two times a day (BID) | ORAL | Status: DC

## 2012-05-04 MED ORDER — NYSTATIN 100000 UNIT/ML MT SUSP: 5.0000 mL | Freq: Four times a day (QID) | OROMUCOSAL | Status: AC

## 2012-05-04 MED ORDER — FLUCONAZOLE 100 MG PO TABS: 100.0000 mg | ORAL_TABLET | Freq: Every day | ORAL | Status: AC

## 2012-05-04 NOTE — Progress Notes (Signed)
Patient discharge teaching given, including activity, diet, follow-up appoints, and medications. Patient verbalized understanding of all discharge instructions. IV access was d/c'd. Vitals are stable. Skin is intact except as charted in most recent assessments. Pt to be escorted out by NT, to be driven home by parents.

## 2012-05-04 NOTE — Discharge Summary (Addendum)
Patient ID: Ronald Le MRN: [id number] DOB/AGE: 30-Mar-1991 21 y.o.  Admit date: 05/01/2012 Discharge date: 05/04/2012  Primary Care Physician:  Lavonda Jumbo, MD, MD  Discharge Diagnoses:    Present on Admission:  .Candida esophagitis  .Down's syndrome .Dehydration .Hypothyroidism    Medication List  As of 05/04/2012 12:45 PM   STOP taking these medications         acetaminophen-codeine 120-12 MG/5ML suspension      moxifloxacin 400 MG tablet         TAKE these medications         feeding supplement Liqd   Take 1 Container by mouth 2 (two) times daily between meals.      fluconazole 100 MG tablet   Commonly known as: DIFLUCAN   Take 1 tablet (100 mg total) by mouth daily.      ibuprofen 200 MG tablet   Commonly known as: ADVIL,MOTRIN   Take 800 mg by mouth every 8 (eight) hours as needed. For pain      levothyroxine 88 MCG tablet   Commonly known as: SYNTHROID, LEVOTHROID   Take 88 mcg by mouth daily.      nystatin 100000 UNIT/ML suspension   Commonly known as: MYCOSTATIN   Take 5 mLs (500,000 Units total) by mouth 4 (four) times daily.      ondansetron 4 MG disintegrating tablet   Commonly known as: ZOFRAN-ODT   Take 4 mg by mouth every 8 (eight) hours as needed.            Consults: Gastroenterology, Dr. Willis Modena  Brief H and P: From the admission note:  21 year old male with history of Down syndrome, hypothyroidism and obstructive sleep apnea who was recently treated for community-acquired pneumonia last week and just completed a course of Avelox for 10 days and did improve initially with antibiotics but however over the last week has had progressive worsening cough, chills, body aches and and decreased by mouth intake. His mom provides most of the history. She says has not been eating and drinking very well. He did have some nausea vomiting and diarrhea over the last several days which has seemed to improve somewhat. He denies any chest pain  or abdominal pain but has been having some shortness of breath. He is very dehydrated. He has not recently been on any steroids. Otherwise patient is sitting in the emergency room and resting comfortably and in no distress but does appear very dehydrated  Candida esophagitis.  Initially the patient was felt to have pneumonia and was placed on IV antibiotics.  Blood cultures were checked and showed no growth to date.  Consequently, IV antibiotics were discontinued after 48 hours.  Chest xray x 2 was negative for pneumonia.  A CT scan of the chest was ordered to determine definitively whether or not the patient had pneumonia.  It was negative for pneumonia, but did show a mid esophageal diverticulum that could contribute to swallowing difficulties or increased coughing.    On hospital day 2, the patient was noted to have great difficulty swallowing - even his own saliva.  He had developed white plaques covering parts of his tongue and pharynx.  [facility] was consulted and recommended continuing IV fluconazole for 48 hours then changing to oral antifungal medication for two weeks.  After 48 hours on IV fluconazole and Nystatin swish and swallow, the patient was much improved, afebrile, and tolerating a full liquid diet.   The candidal infection was felt to be  the result of recent antibiotic therapy for pneumonia.  Diarrhea / vomiting.  Prior to admission the patient had been experiencing N/V.  This resolved on Thursday 6/6 and did not recur, but did leave him dehydrated.  C-diff PCR was negative.  His potassium was slightly low and resolved by adding potassium into his IV fluids.  Hypothyroidism.  Remained quiet and stable on levothyroxine.  H/O Down's Syndrome.  Stable.  Physical Exam on Discharge: General: Alert, awake, oriented x3, in no acute distress. HEENT: No bruits, no goiter. Slight crust on lips, white coating on areas of tongue and pharynx Heart: Regular rate and rhythm, without  murmurs, rubs, gallops. Lungs: Clear to auscultation bilaterally. Abdomen: Soft, nontender, nondistended, positive bowel sounds. Extremities: No clubbing cyanosis or edema with positive pedal pulses. Neuro: Grossly intact, nonfocal.  Filed Vitals:   05/03/12 0600 05/03/12 1400 05/03/12 2142 05/04/12 0600  BP: 108/62 110/69 101/60 115/79  Pulse: 87 77 84 70  Temp: 99.2 F (37.3 C) 97.9 F (36.6 C) 98.1 F (36.7 C) 98 F (36.7 C)  TempSrc: Oral Oral Oral Oral  Resp: 18 16 18 17   Height:      Weight:      SpO2: 97% 97% 98% 96%     Intake/Output Summary (Last 24 hours) at 05/04/12 1245 Last data filed at 05/04/12 0600  Gross per 24 hour  Intake    762 ml  Output      0 ml  Net    762 ml    Basic Metabolic Panel:  Lab 05/04/12 9528 05/03/12 0515  NA 144 142  K 3.7 3.4*  CL 106 107  CO2 28 24  GLUCOSE 100* 92  BUN 4* 4*  CREATININE 0.86 0.86  CALCIUM 8.9 8.6  MG -- --  PHOS -- --   Liver Function Tests:  Lab 05/02/12 0720 05/01/12 2216  AST 13 12  ALT 7 8  ALKPHOS 62 69  BILITOT 0.7 0.5  PROT 5.9* 7.0  ALBUMIN 2.8* 3.4*   CBC:  Lab 05/04/12 0641 05/03/12 0515 05/02/12 0115  WBC 8.4 10.7* --  NEUTROABS -- 8.5* 11.4*  HGB 14.5 14.5 --  HCT 40.7 40.5 --  MCV 89.8 90.2 --  PLT 189 163 --   Micro Results: Results for orders placed during the hospital encounter of 05/01/12  CULTURE, BLOOD (ROUTINE X 2)     Status: Normal (Preliminary result)   Collection Time   05/01/12 10:05 PM      Component Value Range Status Comment   Specimen Description BLOOD LEFT ARM   Final    Special Requests BOTTLES DRAWN AEROBIC AND ANAEROBIC 10CC EACH   Final    Culture  Setup Time [id number]   Final    Culture     Final    Value:        BLOOD CULTURE RECEIVED NO GROWTH TO DATE CULTURE WILL BE HELD FOR 5 DAYS BEFORE ISSUING A FINAL NEGATIVE REPORT   Report Status PENDING   Incomplete   CULTURE, BLOOD (ROUTINE X 2)     Status: Normal (Preliminary result)   Collection Time     05/01/12 10:10 PM      Component Value Range Status Comment   Specimen Description BLOOD RIGHT HAND   Final    Special Requests BOTTLES DRAWN AEROBIC AND ANAEROBIC [facility]   Final    Culture  Setup Time [id number]   Final    Culture     Final  Value:        BLOOD CULTURE RECEIVED NO GROWTH TO DATE CULTURE WILL BE HELD FOR 5 DAYS BEFORE ISSUING A FINAL NEGATIVE REPORT   Report Status PENDING   Incomplete   CULTURE, EXPECTORATED SPUTUM-ASSESSMENT     Status: Normal   Collection Time   05/02/12  2:22 AM      Component Value Range Status Comment   Specimen Description SPUTUM   Final    Special Requests NONE   Final    Sputum evaluation     Final    Value: THIS SPECIMEN IS ACCEPTABLE. RESPIRATORY CULTURE REPORT TO FOLLOW.   Report Status 05/02/2012 FINAL   Final   CULTURE, RESPIRATORY     Status: Normal   Collection Time   05/02/12  2:22 AM      Component Value Range Status Comment   Specimen Description SPUTUM   Final    Special Requests NONE   Final    Gram Stain     Final    Value: FEW GRAM POSITIVE COCCI IN PAIRS     FEW SQUAMOUS EPITHELIAL CELLS PRESENT     NO ORGANISMS SEEN   Culture     Final    Value: MODERATE STAPHYLOCOCCUS AUREUS     Note: RIFAMPIN AND GENTAMICIN SHOULD NOT BE USED AS SINGLE DRUGS FOR TREATMENT OF STAPH INFECTIONS. This organism is presumed to be Clindamycin resistant based on detection of inducible Clindamycin resistance.   Report Status 05/04/2012 FINAL   Final    Organism ID, Bacteria STAPHYLOCOCCUS AUREUS   Final      Other Pertinent Lab Results:    Significant Diagnostic Studies:  Dg Chest 2 View  05/02/2012  *RADIOLOGY REPORT*  Clinical Data: Pneumonia and sepsis.  CHEST - 2 VIEW  Comparison: 05/01/2012  Findings: Two views of the chest were obtained.  The lungs are clear bilaterally. Heart and mediastinum are within normal limits. The trachea is midline.  No evidence for pleural effusions.  There are distended loops of small bowel in the upper  abdomen.  IMPRESSION: No acute chest findings.  Gas-filled loops of small bowel in the upper abdomen.  Original Report Authenticated By: Richarda Overlie, M.D.   Dg Chest 2 View  04/29/2012  *RADIOLOGY REPORT*  Clinical Data: Recent diagnosis of pneumonia, follow-up  CHEST - 2 VIEW  Comparison: Chest x-ray of 04/22/2012  Findings: The lungs are clear.  No pneumonia is seen.  Mediastinal contours appear normal.  The heart is within normal limits in size. No bony abnormality is seen.  IMPRESSION: No active lung disease.  Original Report Authenticated By: Juline Patch, M.D.   Dg Chest 2 View  04/22/2012  *RADIOLOGY REPORT*  Clinical Data: Cough with fever and headaches for 2 days.  CHEST - 2 VIEW  Comparison: 10/03/2006.  Findings: Normal heart size with slight hyperinflation. Faint opacity right lower lung zones, likely right lower lobe, suggests early pneumonia.  Mild peribronchial thickening.  No effusion or pneumothorax.   Bones are negative.  Similar appearance to priors.  IMPRESSION: Slight hyperinflation. Cannot exclude early right lower lobe infiltrate.  Mild peribronchial thickening.  Original Report Authenticated By: Elsie Stain, M.D.   Ct Chest Wo Contrast  05/02/2012  *RADIOLOGY REPORT*  Clinical Data: Oral thrush with productive cough.  Question pneumonia.  CT CHEST WITHOUT CONTRAST  Technique:  Multidetector CT imaging of the chest was performed following the standard protocol without IV contrast.  Comparison: Prior chest radiographs, most recently done today.  Findings: The lungs are clear.  There is no evidence of pneumonia or endobronchial lesion.  There is central air cyst formation in the left lower lobe on image 28.  No enlarged mediastinal or hilar lymph nodes are identified.  There is a focal air containing collection in the posterior right mediastinum.  This measures up to 3.4 cm in cephalocaudad extent. This appears to be contiguous with the esophagus which is mildly dilated to the right.   No surrounding inflammatory change or extraluminal air collection is identified.  There is no pneumothorax or pneumo mediastinum.  The distal esophagus appears normal. There is no pleural or pericardial effusion.  The visualized upper abdomen appears normal.  IMPRESSION:  1.  No evidence of pneumonia. 2.  Suspected right lateral mid esophageal diverticulum. Diverticula in this location are typically traction based, although given the patient's age, this could reflect a congenital or pulsion diverticulum.  This could contribute to the patient's symptoms. Further assessment with non emergent esophagram suggested.  Original Report Authenticated By: Gerrianne Scale, M.D.   Dg Chest Portable 1 View  05/01/2012  *RADIOLOGY REPORT*  Clinical Data: Fever, nausea/vomiting, cough  PORTABLE CHEST - 1 VIEW  Comparison: 04/29/2012  Findings: Lungs are clear. No pleural effusion or pneumothorax.  Cardiomediastinal silhouette is within normal limits.  IMPRESSION: No evidence of acute cardiopulmonary disease.  Original Report Authenticated By: Charline Bills, M.D.      Disposition and Follow-up: stable for discharge to home in the care of his parents.  Discharge Orders    Future Orders Please Complete By Expires   Diet - low sodium heart healthy      Increase activity slowly         Time spent on Discharge: 40 min.  SignedStephani Police 05/04/2012, 12:45 PM [phone]   Attending - I have seen and examined the patient, I agree with the assessment and plan as outlined above. Euel is so much better than on admission, he is actually tolerating a diet, his mother claims he has not been as good as this since 2 weeks. He has been afebrile for more than 24 hours off all antibiotics, his blood cultures are negative to date as well. He is stable to go home today, his mother is a RN-and is very involved in his care, family will follow up cultures  With their PCP Dr Lynelle Doctor. He will continue with Diflucan for  atleast 2 weeks, and then see Dr Dulce Sellar at his office.  Dr Windell Norfolk   ADDENDUM 3:45 pm 05/04/2012.  Pharmacy notified us that Mr. Lusk' sputum culture grew MSSA that was not sensitive to fluoroquinolones.  We discussed potential treatment with Dr. Drue Second of ID as the patient had clean radiological exams and was not symptomatic.  Dr. Drue Second felt that with a clean CT, and CXR, along with a patient that was significantly clinically improved - there was no reason to treat with antibiotics.  Ronald Downs, PA-C [facility] Pager: [phone]

## 2012-05-04 NOTE — Care Management Note (Signed)
    Page 1 of 1   05/04/2012     11:48:42 AM   CARE MANAGEMENT NOTE 05/04/2012  Patient:  KHALID, LACKO   Account Number:  [id number]  Date Initiated:  05/04/2012  Documentation initiated by:  Letha Cape  Subjective/Objective Assessment:   dx ?pna, esaphagitis  admit- lives with parent.     Action/Plan:   Anticipated DC Date:  05/04/2012   Anticipated DC Plan:  HOME/SELF CARE      DC Planning Services  CM consult      Choice offered to / List presented to:             Status of service:  Completed, signed off Medicare Important Message given?   (If response is "NO", the following Medicare IM given date fields will be blank) Date Medicare IM given:   Date Additional Medicare IM given:    Discharge Disposition:  HOME/SELF CARE  Per UR Regulation:  Reviewed for med. necessity/level of care/duration of stay  If discussed at Long Length of Stay Meetings, dates discussed:    Comments:  05/04/12 11:44 Letha Cape RN, BSN [phone] patient lives with parent, pta independent.  Patient has medication coverage and transportation.   Patient for dc today, No needs anticipated.  NCM will continue to follow.

## 2012-05-04 NOTE — Progress Notes (Signed)
Subjective: Dysphagia much improved. Odynophagia much improved. Tolerating diet without difficulty.  Objective: Vital signs in last 24 hours: Temp:  [97.9 F (36.6 C)-98.1 F (36.7 C)] 98 F (36.7 C) (06/11 0600) Pulse Rate:  [70-84] 70  (06/11 0600) Resp:  [16-18] 17  (06/11 0600) BP: (101-115)/(60-79) 115/79 mmHg (06/11 0600) SpO2:  [96 %-98 %] 96 % (06/11 0600) Weight change:  Last BM Date: 05/04/12  PE: GEN:  NAD   Assessment:  1.  Oropharyngeal and esophageal candidiasis, in setting of recent antibiotics. 2.  Dysphagia and odynophagia.  Much improved on antifungal therapy. 3.  Mid-esophageal diverticulum.  Doubt source of acute symptoms.  Suspect he might have component of chronic esophageal dysmotility.  Plan:  1.  OK to discharge on 14-day regimen of nystatin suspension and oral fluconazole. 2.  We will make arrangements for patient to follow-up with [place] in the next couple weeks; we will consider elective outpatient barium esophagram at that point. 3.  Will sign-off; please call with questions; thank you for the consultation.   Freddy Jaksch 05/04/2012, 9:14 AM

## 2012-05-05 NOTE — Telephone Encounter (Signed)
Noted.  I was aware and have already reviewed their discharge summaries.

## 2012-05-08 LAB — CULTURE, BLOOD (ROUTINE X 2): Culture: NO GROWTH

## 2012-05-12 ENCOUNTER — Encounter: Payer: Self-pay | Admitting: Family Medicine

## 2012-05-12 ENCOUNTER — Ambulatory Visit (INDEPENDENT_AMBULATORY_CARE_PROVIDER_SITE_OTHER): Payer: BC Managed Care – PPO | Admitting: Family Medicine

## 2012-05-12 VITALS — BP 108/70 | HR 68 | Temp 98.4°F | Ht <= 58 in | Wt 107.0 lb

## 2012-05-12 DIAGNOSIS — B3781 Candidal esophagitis: Secondary | ICD-10-CM

## 2012-05-12 DIAGNOSIS — B353 Tinea pedis: Secondary | ICD-10-CM | POA: Insufficient documentation

## 2012-05-12 DIAGNOSIS — B351 Tinea unguium: Secondary | ICD-10-CM

## 2012-05-12 DIAGNOSIS — Q396 Congenital diverticulum of esophagus: Secondary | ICD-10-CM | POA: Insufficient documentation

## 2012-05-12 DIAGNOSIS — Q398 Other congenital malformations of esophagus: Secondary | ICD-10-CM

## 2012-05-12 NOTE — Progress Notes (Signed)
Chief Complaint  Patient presents with  . Follow-up    hospital follow up. Also would you to look at his feet and toenails.   HPI: Admitted 6/8-11 with dehydration, candida esophagitis, after recently being treated for community-acquired pneumonia. He was treated with diflucan 100mg  daily (x2 weeks) and mycostatin QID. He was also found to have a mid-esophageal diverticulum that may have contributed to swallowing difficulties/cough.  He had N/V/D prior to hospitalization which has resolved.  Respiratory culture--+Staph aureus (MSSA).  Apparently this result was discussed with Dr. Drue Second of ID, and given clean CXR and CT, and clinical improvement, it wasn't felt that ABX were necessary.  He has appt 7/12 with Dr. Dulce Sellar for f/u esophagitis and divericulum.  No further fevers since discharge.  Occasional cough.  Denies any phlegm, vomiting.  Will cough sometimes if he eats too fast.  Still using the mycostatin once or twice daily, and he is taking fluconazole daily.  No mouth discomfort.  Sometimes will say his jaw hurts.  Denies any choking or problems eating, other than taking him a long time to eat.  Intake of fluids is improved.  Bowels are normal. Twice he has accidentally taken meds twice in a day.  He has h/o onychomycosis, treated in past x 3 months by podiatrist.  Seems to have flared back up while he was in the hospital.  Also having rash/problems between the toes.    Past Medical History  Diagnosis Date  . Allergy   . Hypothyroidism   . Legg-Calve-Perthes disease   . Sleep apnea   . Down's syndrome   . History of pneumonia     multiple hospitalizions as toddler, but 10 day stay age 13yo  . Rash     hospitalized as a child, etiology latent viral exanthem  . Viral syndrome 09/1995    hospital viral syndrome.  . Chronic serous otitis media   . Scoliosis 6/99    leg length discrepancy.  . Finger fracture 2009    h/o  . Candida esophagitis 04/2012    after ABX for pneumonia    Past Surgical History  Procedure Date  . Hip arthroplasty     due to Legg-Calve-Perthe dz and subsequent surgery to remove hardware  . Tympanostomy tube placement   . Gum surgery     graft to gums from roof of mouth.   History   Social History  . Marital Status: Single    Spouse Name: N/A    Number of Children: N/A  . Years of Education: N/A   Occupational History  . Not on file.   Social History Main Topics  . Smoking status: Never Smoker   . Smokeless tobacco: Not on file  . Alcohol Use: No  . Drug Use: No  . Sexually Active: Not on file   Other Topics Concern  . Not on file   Social History Narrative  . No narrative on file   Current Outpatient Prescriptions on File Prior to Visit  Medication Sig Dispense Refill  . ibuprofen (ADVIL,MOTRIN) 200 MG tablet Take 800 mg by mouth every 8 (eight) hours as needed. For pain      . levothyroxine (SYNTHROID, LEVOTHROID) 88 MCG tablet Take 88 mcg by mouth daily.        Marland Kitchen nystatin (MYCOSTATIN) 100000 UNIT/ML suspension Take 5 mLs (500,000 Units total) by mouth 4 (four) times daily.  60 mL  1  . feeding supplement (RESOURCE BREEZE) LIQD Take 1 Container by mouth 2 (two)  times daily between meals.  30 Container  1  . fluconazole (DIFLUCAN) 100 MG tablet Take 1 tablet (100 mg total) by mouth daily.  14 tablet  0   No Known Allergies  ROS:  Denies nausea, vomiting, diarrhea, fevers, abdominal pain.  Some sore throat with coughing.  Normal appetite and energy.  No skin rash except as per HPI.  PHYSICAL EXAM: BP 108/70  Pulse 68  Temp 98.4 F (36.9 C) (Oral)  Ht 4\' 10"  (1.473 m)  Wt 107 lb (48.535 kg)  BMI 22.36 kg/m2 Pleasant, cooperative male in no distress HEENT:  PERRL, conjunctiva clear Mouth: lips are dry and cracked. OP has some slightly thickened secretions--can't tell if L posterior OP has thickened secretions vs ongoing thrush, appears most likely to be secretions, as there was some change with swallowing. Mucus  membranes were moist Neck: no lymphadenopathy or mass Heart: regular rate and rhythm Lungs: clear bilaterally Abdomen: soft, nontender Extremities: no edema.   Thickening and discoloration of R 3rd toenail (this is the thickest) and discoloration of L 1,3,5, R 3,5 toenails (with minimal thickening). Some peeling of skin and very slight maceration between R 2nd and 3rd toe.  No evidence of ingrown toenail or bacterial infection.  ASSESSMENT/PLAN: 1. Candida esophagitis   2. Onychomycosis   3. Tinea pedis   4. Esophageal diverticulum    Complete course of fluconazole. Continue oral mycostatin until mouth looks clear.  Try gargling/swishing it prior to swallowing Continue vaseline to lips  F/u with Dr. Dulce Sellar as scheduled.  Onychomycosis:  Discussed that treatment at this point is mainly cosmetic.  Keep toenails trimmed.  If has ingrown nails, pain, infection then repeat treatment is recommended--not recommended now.  Tinea pedis--should be covered by oral antifungals, but almost through with course.  Counseled parents regarding OTC treatments, need to keep the area dry.   25 min visit, more than 1/2 counseling.

## 2012-05-12 NOTE — Patient Instructions (Addendum)
  Complete course of fluconazole. Continue oral mycostatin until mouth looks clear.  Try gargling/swishing it prior to swallowing Continue vaseline to lips  Follow up as scheduled with Dr. Dulce Sellar.  The toenail fungal infection doesn't necessarily need to be treated--only if having pain, ingrowing toenail.  Keep toenails trimmed.  There may be some evidence of athlete's foot--only appeared mild today, likely is responding to the oral antifungals.  You can continue to use topical medications such as Lamisil or clotrimazole cream twice daily until cracking between toes is better.  Keep the feet dry, and consider using cornstarch (or medicated) powder to help keep the area dry.  Wear cotton socks and change frequently to keep dry, vs keeping open to air.

## 2012-05-19 ENCOUNTER — Encounter: Payer: Self-pay | Admitting: Family Medicine

## 2012-05-19 ENCOUNTER — Ambulatory Visit (INDEPENDENT_AMBULATORY_CARE_PROVIDER_SITE_OTHER): Payer: BC Managed Care – PPO | Admitting: Family Medicine

## 2012-05-19 VITALS — BP 92/62 | HR 76 | Temp 97.9°F | Ht <= 58 in | Wt 102.0 lb

## 2012-05-19 DIAGNOSIS — J069 Acute upper respiratory infection, unspecified: Secondary | ICD-10-CM

## 2012-05-19 DIAGNOSIS — K130 Diseases of lips: Secondary | ICD-10-CM

## 2012-05-19 NOTE — Progress Notes (Signed)
Chief Complaint  Patient presents with  . Follow-up    still not feeling well, not eating. Is drinking Ensure. Says that the back of his neck hurts and his throat still hurts. Is coughing less sice starting cetirizine-but not completely gone and they are going out of town tomorrow.,   HPI:  Sneezing, blowing his nose (clear mucus) and cough started back up recently.  Starting using cetirizine, which has helped some.  Still has slight cough, occasional sneeze, and still blowing his nose a lot.  They returned today because the other night he wouldn't eat or drink, and was moping (2 days ago).  Ate better yesterday, and drank Ensure yesterday.  He "devoured" [place] food last night. Parents still need to fuss to get him to drink enough.  He was recently hospitalized with dehydration and candidal esophagitis.  He has completed his course of therapy. Has f/u with GI on 7/12.  Denies pain with eating, no sore throat, just pain on the outside of the lips (which are still cracked and dry).  Neck is a little sore--mother things from sneezing/coughing.    Past Medical History  Diagnosis Date  . Allergy   . Hypothyroidism   . Legg-Calve-Perthes disease   . Sleep apnea   . Down's syndrome   . History of pneumonia     multiple hospitalizions as toddler, but 10 day stay age 67yo  . Rash     hospitalized as a child, etiology latent viral exanthem  . Viral syndrome 09/1995    hospital viral syndrome.  . Chronic serous otitis media   . Scoliosis 6/99    leg length discrepancy.  . Finger fracture 2009    h/o  . Candida esophagitis 04/2012    after ABX for pneumonia   Past Surgical History  Procedure Date  . Hip arthroplasty     due to Legg-Calve-Perthe dz and subsequent surgery to remove hardware  . Tympanostomy tube placement   . Gum surgery     graft to gums from roof of mouth.   History   Social History  . Marital Status: Single    Spouse Name: N/A    Number of Children: N/A  . Years of  Education: N/A   Occupational History  . Not on file.   Social History Main Topics  . Smoking status: Never Smoker   . Smokeless tobacco: Not on file  . Alcohol Use: No  . Drug Use: No  . Sexually Active: Not on file   Other Topics Concern  . Not on file   Social History Narrative  . No narrative on file   Current Outpatient Prescriptions on File Prior to Visit  Medication Sig Dispense Refill  . cetirizine (ZYRTEC) 10 MG tablet Take 10 mg by mouth daily.      Marland Kitchen ibuprofen (ADVIL,MOTRIN) 200 MG tablet Take 800 mg by mouth every 8 (eight) hours as needed. For pain      . levothyroxine (SYNTHROID, LEVOTHROID) 88 MCG tablet Take 88 mcg by mouth daily.        . feeding supplement (RESOURCE BREEZE) LIQD Take 1 Container by mouth 2 (two) times daily between meals.  30 Container  1   No Known Allergies  ROS:  Denies fevers, shortness of breath, chest pain.  Denies headaches, sinus pain.  Per computer, he has had 5# weight loss in 1 week.  Denies joint pains, skin rashes, or other concerns. +chapped and cracking lips.  Parents state that he likes  to constantly lick his lips (not noted during visit).  PHYSICAL EXAM: BP 92/62  Pulse 76  Temp 97.9 F (36.6 C) (Oral)  Ht 4\' 10"  (1.473 m)  Wt 102 lb (46.267 kg)  BMI 21.32 kg/m2 Small male, in no distress.  He is alert, paying attention, and answering questions, usually in yes/no fashion  Mouth--crusted around lips, especially at angles bilaterally TM's: normal on left, on right there is whitish mucus behind TM.  TM is not red or bulging.  Nasal mucosa mildly edematous, no purulence.  OP with thick secretions, slightly red on upper palate.  No thrust noted. Sinuses nontender Neck: some enlarged anterior cervical lymph nodes bilaterally Heart: regular rate and rhythm without murmur Lungs: clear bilaterally Skin: no rash (except around mouth)  ASSESSMENT/PLAN:  1. Acute upper respiratory infections of unspecified site   2. Cracked lips      Likely has viral URI contributing to runny nose and cough, as well as ear findings.  Given lack of pain, fever, and recent fungal esophagitis as a complication from antibiotics, will hold off on using ABX right now.  Call if fevers develop, or significant ear pain.  If doesn't develop, recheck ear 1 week.  Encouraged to drink more fluid, and get adequate caloric intake.  No evidence of significant deydration, possibly mild given thickened secretions and intermittent poor po fluid intake.  Dermatitis around lips--likely related to licking. Rash didn't improve with oral antifungal therapy, so likely not fungal.  Encouraged continue lubrication and protection.    F/u 1 week for recheck of R ear

## 2012-05-19 NOTE — Patient Instructions (Addendum)
Likely has viral URI contributing to runny nose and cough, as well as ear findings.  Given lack of pain, fever, and recent fungal esophagitis as a complication from antibiotics, will hold off on using antibiotics right now.  Call if fevers develop, or significant ear pain.  If doesn't develop, recheck ear 1 week.  Consider checking TSH in 1 week if further weight loss, when eating okay.  Encouraged to drink more fluid, and get adequate caloric intake.  Supportive measures for cold symptoms--Robitussin, nasal saline spray

## 2012-05-20 ENCOUNTER — Ambulatory Visit: Payer: BC Managed Care – PPO | Admitting: Family Medicine

## 2012-05-26 ENCOUNTER — Ambulatory Visit (INDEPENDENT_AMBULATORY_CARE_PROVIDER_SITE_OTHER): Payer: BC Managed Care – PPO | Admitting: Family Medicine

## 2012-05-26 ENCOUNTER — Encounter: Payer: Self-pay | Admitting: Family Medicine

## 2012-05-26 VITALS — BP 104/70 | HR 72 | Temp 97.7°F | Ht <= 58 in | Wt 105.0 lb

## 2012-05-26 DIAGNOSIS — H6590 Unspecified nonsuppurative otitis media, unspecified ear: Secondary | ICD-10-CM

## 2012-05-26 DIAGNOSIS — H6120 Impacted cerumen, unspecified ear: Secondary | ICD-10-CM

## 2012-05-26 DIAGNOSIS — H9190 Unspecified hearing loss, unspecified ear: Secondary | ICD-10-CM

## 2012-05-26 NOTE — Patient Instructions (Signed)
There appears to be fluid behind both eardrums (the right eardrum was more easily visualized than the left).  The fluid seems lighter/thinner than at last visit, so I think it is improving, and should continue to improve.  May take up to a month to completely resolve.  While there is fluid behind the ear, it can cause decrease in hearing.  If his hearing doesn't improve over the next 2-3 weeks, please follow up with Dr. Jac Canavan for evaluation.

## 2012-05-26 NOTE — Progress Notes (Signed)
Chief Complaint  Patient presents with  . Follow-up    1 week follow on ears and still has cough.   HPI: Mother had previously thought he had "selective" hearing.  He spent time with his aunt this weekend, and noticed more significant hearing loss.  Mother has noticed over the last few days that he has been having trouble hearing more recently.  Denies ear pain, fevers.  Continues to cough some--sounds similar to his dad's cough, episodes of coughing spells.  No vomiting.  Appetite has been better, gained 3 pounds.  He has been drinking fluids well. Continues to have some sneezing, runny nose, but improving somewhat.  Past Medical History  Diagnosis Date  . Allergy   . Hypothyroidism   . Legg-Calve-Perthes disease   . Sleep apnea   . Down's syndrome   . History of pneumonia     multiple hospitalizions as toddler, but 10 day stay age 21yo  . Rash     hospitalized as a child, etiology latent viral exanthem  . Viral syndrome 09/1995    hospital viral syndrome.  . Chronic serous otitis media   . Scoliosis 6/99    leg length discrepancy.  . Finger fracture 2009    h/o  . Candida esophagitis 04/2012    after ABX for pneumonia   History   Social History  . Marital Status: Single    Spouse Name: N/A    Number of Children: N/A  . Years of Education: N/A   Occupational History  . Not on file.   Social History Main Topics  . Smoking status: Never Smoker   . Smokeless tobacco: Not on file  . Alcohol Use: No  . Drug Use: No  . Sexually Active: Not on file   Other Topics Concern  . Not on file   Social History Narrative  . No narrative on file   Current Outpatient Prescriptions on File Prior to Visit  Medication Sig Dispense Refill  . cetirizine (ZYRTEC) 10 MG tablet Take 10 mg by mouth daily.      . feeding supplement (RESOURCE BREEZE) LIQD Take 1 Container by mouth 2 (two) times daily between meals.  30 Container  1  . ibuprofen (ADVIL,MOTRIN) 200 MG tablet Take 800 mg  by mouth every 8 (eight) hours as needed. For pain      . levothyroxine (SYNTHROID, LEVOTHROID) 88 MCG tablet Take 88 mcg by mouth daily.         No Known Allergies  ROS:  Denies fever, vomiting, diarrhea, sore throat, shortness of breath, skin rash. +hearing loss  PHYSICAL EXAM: BP 104/70  Pulse 72  Temp 97.7 F (36.5 C) (Oral)  Ht 4\' 10"  (1.473 m)  Wt 105 lb (47.628 kg)  BMI 21.95 kg/m2 Pleasant male, in no distress.  Answering questions appropriate with yes or no, whispering answers  Lips are much better--no longer cracked, just slightly pink at edges, and is licking lips during exam. R TM--some light yellow fluid behind TM (lighter than last visit).  L TM not visualized due to cerumen--after lavage and removal with curette TM was able to be visualized.  Also appeared to have small effusion, but no erythema (only partly visualized)  Nasal mucosa normal with some clear mucus. OP clear Lungs: clear bilaterally Heart: regular rate and rhythm without murmur Skin: no rash +hearing loss noted on screening exam today.  ASSESSMENT/PLAN: 1. Cerumen impaction  Ear cerumen removal  2. Serous otitis media    3. Hearing  loss     bilateral.  most likely due to effusion.  If not improving in the next few weeks, f/u with Dr. Jac Canavan for evaluation   Cough, likely secondary to URI, stable/improving.

## 2012-07-09 ENCOUNTER — Other Ambulatory Visit: Payer: Self-pay | Admitting: Gastroenterology

## 2012-07-09 DIAGNOSIS — K229 Disease of esophagus, unspecified: Secondary | ICD-10-CM

## 2012-07-14 ENCOUNTER — Ambulatory Visit
Admission: RE | Admit: 2012-07-14 | Discharge: 2012-07-14 | Disposition: A | Discharge status: Home / Self Care | Payer: BC Managed Care – PPO | Source: Ambulatory Visit | Attending: Gastroenterology | Admitting: Gastroenterology

## 2012-07-14 DIAGNOSIS — K229 Disease of esophagus, unspecified: Secondary | ICD-10-CM

## 2012-09-01 ENCOUNTER — Encounter (HOSPITAL_COMMUNITY): Payer: Self-pay | Admitting: *Deleted

## 2012-09-01 ENCOUNTER — Ambulatory Visit (HOSPITAL_COMMUNITY)
Admission: RE | Admit: 2012-09-01 | Discharge: 2012-09-01 | Disposition: A | Discharge status: Home / Self Care | Payer: BC Managed Care – PPO | Source: Ambulatory Visit | Attending: Gastroenterology | Admitting: Gastroenterology

## 2012-09-01 ENCOUNTER — Ambulatory Visit (HOSPITAL_COMMUNITY): Payer: BC Managed Care – PPO | Admitting: Anesthesiology

## 2012-09-01 ENCOUNTER — Encounter (HOSPITAL_COMMUNITY)
Admission: RE | Disposition: A | Discharge status: Home / Self Care | Payer: Self-pay | Source: Ambulatory Visit | Attending: Gastroenterology

## 2012-09-01 ENCOUNTER — Encounter (HOSPITAL_COMMUNITY): Payer: Self-pay | Admitting: Anesthesiology

## 2012-09-01 DIAGNOSIS — R1310 Dysphagia, unspecified: Secondary | ICD-10-CM | POA: Insufficient documentation

## 2012-09-01 DIAGNOSIS — K224 Dyskinesia of esophagus: Secondary | ICD-10-CM | POA: Insufficient documentation

## 2012-09-01 DIAGNOSIS — A048 Other specified bacterial intestinal infections: Secondary | ICD-10-CM | POA: Insufficient documentation

## 2012-09-01 DIAGNOSIS — K2960 Other gastritis without bleeding: Secondary | ICD-10-CM | POA: Insufficient documentation

## 2012-09-01 DIAGNOSIS — R112 Nausea with vomiting, unspecified: Secondary | ICD-10-CM | POA: Insufficient documentation

## 2012-09-01 HISTORY — PX: BALLOON DILATION: SHX5330

## 2012-09-01 SURGERY — ESOPHAGOGASTRODUODENOSCOPY (EGD) WITH PROPOFOL
Anesthesia: Monitor Anesthesia Care

## 2012-09-01 MED ORDER — METOCLOPRAMIDE HCL 5 MG/5ML PO SOLN: 5.0000 mg | Freq: Three times a day (TID) | ORAL | Status: DC

## 2012-09-01 MED ORDER — LACTATED RINGERS IV SOLN
INTRAVENOUS | Status: DC
  Administered 2012-09-01: 13:00:00 via INTRAVENOUS

## 2012-09-01 MED ORDER — PROPOFOL 10 MG/ML IV EMUL
INTRAVENOUS | Status: DC | PRN
  Administered 2012-09-01: 80 ug/kg/min via INTRAVENOUS

## 2012-09-01 MED ORDER — MIDAZOLAM HCL 5 MG/5ML IJ SOLN
INTRAMUSCULAR | Status: DC | PRN
  Administered 2012-09-01 (×2): 1 mg via INTRAVENOUS

## 2012-09-01 MED ORDER — SODIUM CHLORIDE 0.9 % IV SOLN: INTRAVENOUS | Status: DC

## 2012-09-01 MED ORDER — FENTANYL CITRATE 0.05 MG/ML IJ SOLN
INTRAMUSCULAR | Status: DC | PRN
  Administered 2012-09-01: 25 ug via INTRAVENOUS

## 2012-09-01 MED ORDER — BUTAMBEN-TETRACAINE-BENZOCAINE 2-2-14 % EX AERO
INHALATION_SPRAY | CUTANEOUS | Status: DC | PRN
  Administered 2012-09-01: 1 via TOPICAL

## 2012-09-01 SURGICAL SUPPLY — 15 items

## 2012-09-01 NOTE — H&P (Signed)
Patient interval history reviewed.  Patient examined again.  There has been no change from documented H/P dated 08/20/12 (scanned into chart from our office) except as documented above.  Assessment:  1.  Dysphagia. 2.  Nausea and vomiting. 3.  Abnormal barium esophagram.  Plan:  1.  Upper endoscopy with possible balloon esophageal dilatation. 2.  Risks (bleeding, infection, bowel perforation that could require surgery, sedation-related changes in cardiopulmonary systems), benefits (identification and possible treatment of source of symptoms, exclusion of certain causes of symptoms), and alternatives (watchful waiting, radiographic imaging studies, empiric medical treatment) of upper endoscopy with possible esophageal balloon dilatation (EGD +/- DIL) were explained to patient/mother in detail and they wish to proceed. 

## 2012-09-01 NOTE — Transfer of Care (Signed)
Immediate Anesthesia Transfer of Care Note  Patient: OLAND MALANGA  Procedure(s) Performed: Procedure(s) (LRB) with comments: ESOPHAGOGASTRODUODENOSCOPY (EGD) WITH PROPOFOL (N/A) BALLOON DILATION (N/A)  Patient Location: PACU  Anesthesia Type: MAC  Level of Consciousness: sedated  Airway & Oxygen Therapy: Patient Spontanous Breathing and Patient connected to nasal cannula oxygen  Post-op Assessment: Report given to PACU RN and Post -op Vital signs reviewed and stable  Post vital signs: Reviewed and stable  Complications: No apparent anesthesia complications

## 2012-09-01 NOTE — Anesthesia Preprocedure Evaluation (Signed)
Anesthesia Evaluation  Patient identified by MRN, date of birth, ID band Patient awake    Reviewed: Allergy & Precautions, H&P , NPO status , Patient's Chart, lab work & pertinent test results  Airway Mallampati: II TM Distance: >3 FB Neck ROM: Full    Dental No notable dental hx.    Pulmonary neg pulmonary ROS, sleep apnea ,  breath sounds clear to auscultation  Pulmonary exam normal       Cardiovascular negative cardio ROS  Rhythm:Regular Rate:Normal     Neuro/Psych Down's Syndrome  negative neurological ROS  negative psych ROS   GI/Hepatic negative GI ROS, Neg liver ROS,   Endo/Other  negative endocrine ROSHypothyroidism   Renal/GU negative Renal ROS  negative genitourinary   Musculoskeletal negative musculoskeletal ROS (+)   Abdominal   Peds negative pediatric ROS (+)  Hematology negative hematology ROS (+)   Anesthesia Other Findings   Reproductive/Obstetrics negative OB ROS                           Anesthesia Physical Anesthesia Plan  ASA: III  Anesthesia Plan: MAC   Post-op Pain Management:    Induction:   Airway Management Planned: Simple Face Mask and Nasal Cannula  Additional Equipment:   Intra-op Plan:   Post-operative Plan:   Informed Consent: I have reviewed the patients History and Physical, chart, labs and discussed the procedure including the risks, benefits and alternatives for the proposed anesthesia with the patient or authorized representative who has indicated his/her understanding and acceptance.     Plan Discussed with: CRNA  Anesthesia Plan Comments:         Anesthesia Quick Evaluation

## 2012-09-01 NOTE — Op Note (Signed)
[facility] [address] [place], [address]   ENDOSCOPY PROCEDURE REPORT  PATIENT: Ronald Le, Ronald Le  MR#: [id number] BIRTHDATE: 07-31-1991 , 20  yrs. old GENDER: Male ENDOSCOPIST: Willis Modena, MD REFERRED BY:  Joselyn Arrow, M.D. PROCEDURE DATE:  09/01/2012 PROCEDURE:  EGD w/ biopsy and balloon dilation of esophagus ASA CLASS:     Class III INDICATIONS:  dysphagia; nausea; vomiting; abnormal barium esophagram. MEDICATIONS: MAC sedation, administered by CRNA TOPICAL ANESTHETIC: Cetacaine Spray  DESCRIPTION OF PROCEDURE: After the risks benefits and alternatives of the procedure were thoroughly explained, informed consent was obtained.  The Pentax Gastroscope [id number] endoscope was introduced through the mouth and advanced to the second portion of the duodenum. Without limitations.  The instrument was slowly withdrawn as the mucosa was fully examined.    Findings:  Generalized lack of esophageal motility.  There was some fluid and food debris in the distal esophagus.  There was some proximal esophageal narrowing, mild, that seems more extrinsic in nature without any clear stricture or stenosis.  The distal esophagus was a bit tortuous.  The GE junction was perhaps a bit narrow, but I did not detect a palpable "pop" and I didn't see any stricture or stenosis.  After completion of diagnostic exam (as below), given the patient's symptoms I empirically dilated the GE junction to 15mm with a TTS balloon.  There were no immediate complications post-dilatation.  The stomach had moderate and diffuse erythematous gastritis, which was biopsied with cold forceps.  Remainder of the exam to the second portion of the duodenum was normal.       Retroflexed views revealed no abnormalities.     The scope was then withdrawn from the patient and the procedure completed.  ENDOSCOPIC IMPRESSION:     As above.  I suspect patient has esophageal motility disorder as  predominant cause of his nausea, vomiting and dysphagia symptoms.  RECOMMENDATIONS:     1.  Watch for potential complications of procedure. 2.  Await biopsy results. 3.  Follow clinical response to dilatation. 4.  Continue Dexilant. 5.  Short-term trial of low-dose metoclopramide (5 mg q.i.d.). 6.  Follow-up with [facility] in a few weeks.  eSigned:  Willis Modena, MD 09/01/2012 2:07 PM   CC:

## 2012-09-01 NOTE — Anesthesia Postprocedure Evaluation (Signed)
  Anesthesia Post-op Note  Patient: Ronald Le  Procedure(s) Performed: Procedure(s) (LRB): ESOPHAGOGASTRODUODENOSCOPY (EGD) WITH PROPOFOL (N/A) BALLOON DILATION (N/A)  Patient Location: PACU  Anesthesia Type: MAC  Level of Consciousness: awake and alert   Airway and Oxygen Therapy: Patient Spontanous Breathing  Post-op Pain: mild  Post-op Assessment: Post-op Vital signs reviewed, Patient's Cardiovascular Status Stable, Respiratory Function Stable, Patent Airway and No signs of Nausea or vomiting  Post-op Vital Signs: stable  Complications: No apparent anesthesia complications

## 2012-09-02 ENCOUNTER — Other Ambulatory Visit (INDEPENDENT_AMBULATORY_CARE_PROVIDER_SITE_OTHER): Payer: BC Managed Care – PPO

## 2012-09-02 ENCOUNTER — Encounter: Payer: Self-pay | Admitting: Family Medicine

## 2012-09-02 DIAGNOSIS — Z23 Encounter for immunization: Secondary | ICD-10-CM

## 2012-09-02 DIAGNOSIS — B9681 Helicobacter pylori [H. pylori] as the cause of diseases classified elsewhere: Secondary | ICD-10-CM | POA: Insufficient documentation

## 2012-09-02 DIAGNOSIS — K2970 Gastritis, unspecified, without bleeding: Secondary | ICD-10-CM | POA: Insufficient documentation

## 2012-09-03 ENCOUNTER — Encounter (HOSPITAL_COMMUNITY): Payer: Self-pay | Admitting: Gastroenterology

## 2012-09-29 ENCOUNTER — Other Ambulatory Visit: Payer: Self-pay | Admitting: Gastroenterology

## 2012-09-29 DIAGNOSIS — R112 Nausea with vomiting, unspecified: Secondary | ICD-10-CM

## 2012-09-30 ENCOUNTER — Other Ambulatory Visit: Payer: Self-pay | Admitting: Gastroenterology

## 2012-09-30 ENCOUNTER — Encounter (HOSPITAL_COMMUNITY): Payer: Self-pay | Admitting: General Practice

## 2012-09-30 ENCOUNTER — Ambulatory Visit
Admission: RE | Admit: 2012-09-30 | Discharge: 2012-09-30 | Disposition: A | Discharge status: Home / Self Care | Payer: BC Managed Care – PPO | Source: Ambulatory Visit | Attending: Gastroenterology | Admitting: Gastroenterology

## 2012-09-30 ENCOUNTER — Observation Stay (HOSPITAL_COMMUNITY)
Admission: AD | Admit: 2012-09-30 | Discharge: 2012-10-02 | Disposition: A | Discharge status: Home / Self Care | Payer: BC Managed Care – PPO | Source: Ambulatory Visit | Attending: Internal Medicine | Admitting: Internal Medicine

## 2012-09-30 DIAGNOSIS — K130 Diseases of lips: Secondary | ICD-10-CM

## 2012-09-30 DIAGNOSIS — B9681 Helicobacter pylori [H. pylori] as the cause of diseases classified elsewhere: Secondary | ICD-10-CM

## 2012-09-30 DIAGNOSIS — K219 Gastro-esophageal reflux disease without esophagitis: Secondary | ICD-10-CM | POA: Insufficient documentation

## 2012-09-30 DIAGNOSIS — R112 Nausea with vomiting, unspecified: Secondary | ICD-10-CM | POA: Insufficient documentation

## 2012-09-30 DIAGNOSIS — Q396 Congenital diverticulum of esophagus: Secondary | ICD-10-CM

## 2012-09-30 DIAGNOSIS — E860 Dehydration: Secondary | ICD-10-CM

## 2012-09-30 DIAGNOSIS — K224 Dyskinesia of esophagus: Secondary | ICD-10-CM | POA: Insufficient documentation

## 2012-09-30 DIAGNOSIS — E039 Hypothyroidism, unspecified: Secondary | ICD-10-CM | POA: Insufficient documentation

## 2012-09-30 DIAGNOSIS — B3781 Candidal esophagitis: Secondary | ICD-10-CM

## 2012-09-30 DIAGNOSIS — Q909 Down syndrome, unspecified: Secondary | ICD-10-CM | POA: Insufficient documentation

## 2012-09-30 DIAGNOSIS — B351 Tinea unguium: Secondary | ICD-10-CM

## 2012-09-30 DIAGNOSIS — J189 Pneumonia, unspecified organism: Secondary | ICD-10-CM

## 2012-09-30 DIAGNOSIS — B353 Tinea pedis: Secondary | ICD-10-CM

## 2012-09-30 DIAGNOSIS — R1310 Dysphagia, unspecified: Principal | ICD-10-CM

## 2012-09-30 DIAGNOSIS — G4733 Obstructive sleep apnea (adult) (pediatric): Secondary | ICD-10-CM | POA: Insufficient documentation

## 2012-09-30 HISTORY — DX: Developmental disorder of speech and language, unspecified: F80.9

## 2012-09-30 HISTORY — DX: Adverse effect of unspecified anesthetic, initial encounter: T41.45XA

## 2012-09-30 HISTORY — DX: Obstructive sleep apnea (adult) (pediatric): G47.33

## 2012-09-30 HISTORY — DX: Cardiac murmur, unspecified: R01.1

## 2012-09-30 HISTORY — DX: Dysphagia, unspecified: R13.10

## 2012-09-30 HISTORY — DX: Visible peristalsis: R19.2

## 2012-09-30 HISTORY — DX: Obstructive sleep apnea (adult) (pediatric): Z99.89

## 2012-09-30 HISTORY — DX: Pneumonia, unspecified organism: J18.9

## 2012-09-30 HISTORY — DX: Gastro-esophageal reflux disease without esophagitis: K21.9

## 2012-09-30 LAB — COMPREHENSIVE METABOLIC PANEL
ALT: 14 U/L (ref 0–53)
CO2: 27 mEq/L (ref 19–32)
Calcium: 9.5 mg/dL (ref 8.4–10.5)
GFR calc Af Amer: >90 mL/min (ref >90)
GFR calc non Af Amer: >90 mL/min (ref >90)
Glucose, Bld: 101 mg/dL — ABNORMAL HIGH (ref 70–99)
Sodium: 140 mEq/L (ref 135–145)

## 2012-09-30 LAB — CBC
MCH: 32 pg (ref 26.0–34.0)
MCHC: 35.7 g/dL (ref 30.0–36.0)
Platelets: 168 10*3/uL (ref 150–400)
RBC: 5.15 MIL/uL (ref 4.22–5.81)
RDW: 13 % (ref 11.5–15.5)

## 2012-09-30 MED ORDER — ONDANSETRON HCL 4 MG PO TABS: 4.0000 mg | ORAL_TABLET | Freq: Four times a day (QID) | ORAL | Status: DC | PRN

## 2012-09-30 MED ORDER — MORPHINE SULFATE 2 MG/ML IJ SOLN
1.0000 mg | INTRAMUSCULAR | Status: DC | PRN
  Administered 2012-09-30 – 2012-10-02 (×3): 1 mg via INTRAVENOUS
  Filled 2012-09-30 (×3): qty 1

## 2012-09-30 MED ORDER — SODIUM CHLORIDE 0.9 % IV SOLN
INTRAVENOUS | Status: DC
  Administered 2012-10-02: 10:00:00 via INTRAVENOUS

## 2012-09-30 MED ORDER — LEVOTHYROXINE SODIUM 88 MCG PO TABS
88.0000 ug | ORAL_TABLET | Freq: Every day | ORAL | Status: DC
  Administered 2012-09-30 – 2012-10-02 (×3): 88 ug via ORAL
  Filled 2012-09-30 (×4): qty 1

## 2012-09-30 MED ORDER — NYSTATIN 100000 UNIT/ML MT SUSP
500000.0000 [IU] | Freq: Two times a day (BID) | OROMUCOSAL | Status: DC
  Administered 2012-09-30: 500000 [IU] via ORAL
  Filled 2012-09-30 (×3): qty 5

## 2012-09-30 MED ORDER — ONDANSETRON HCL 4 MG/2ML IJ SOLN: 4.0000 mg | Freq: Four times a day (QID) | INTRAMUSCULAR | Status: DC | PRN

## 2012-09-30 MED ORDER — SODIUM CHLORIDE 0.9 % IV SOLN
INTRAVENOUS | Status: DC
  Administered 2012-09-30: 1000 mL via INTRAVENOUS
  Administered 2012-10-01 (×2): via INTRAVENOUS

## 2012-09-30 MED ORDER — PANTOPRAZOLE SODIUM 40 MG IV SOLR
40.0000 mg | INTRAVENOUS | Status: DC
  Administered 2012-09-30 – 2012-10-01 (×2): 40 mg via INTRAVENOUS
  Filled 2012-09-30 (×3): qty 40

## 2012-09-30 NOTE — H&P (Signed)
PCP:   KNAPP,EVE A, MD   Chief Complaint:  Dysphagia  HPI: 21 yr old male with h/o dysphagia, fungal esophagitis who was seen at Dr Hulen Shouts office today andon esophagram  was found to have significant dysmotility of the esophagus. Patient was sent from the office for admission and EGD is planned for the morning. Patient has not been eating and drinking much for past few days. Patient has Down's syndrome and is a poor historian. Family provides most of the history. He complains of epigastric discomfort. No vomiting, no chest pain, no shortness of breath.  Allergies:  No Known Allergies    Past Medical History  Diagnosis Date  . Allergy   . Hypothyroidism   . Legg-Calve-Perthes disease   . Down's syndrome   . Rash     hospitalized as a child, etiology latent viral exanthem  . Viral syndrome 09/1995    hospital viral syndrome.  . Chronic serous otitis media   . Scoliosis 6/99    leg length discrepancy.  . Finger fracture 2009    h/o  . Candida esophagitis 04/2012    after ABX for pneumonia  . Complication of anesthesia 08/2012    "hard to wake up after balloon dilatation)  . Complication of anesthesia 1999    "respiratory arrest after hip OR; had to get Narcan" (09/30/2012)  . Heart murmur     "@ birth; resolved" (09/30/2012)  . Pneumonia     multiple hospitalizatons as toddler, but 10 day stay age 52yo  . OSA on CPAP   . GERD (gastroesophageal reflux disease)     "maybe" (09/30/2012)  . Headache     "recently; maybe from being dehydrated" (09/30/2012)  . Communication problem     "when he feels bad he just communicates w/thumbs up or down or shakes his head; very limited communication normally" (09/30/2012)  . Hearing loss, bilateral   . Peristalsis     "almost absent" (09/30/2012)  . Dysphagia 09/30/2012    Past Surgical History  Procedure Date  . Hip arthroplasty 10/1998    left; due to Legg-Calve-Perthe dz and subsequent surgery to remove hardware  . Tympanostomy tube  placement 11/1996    bilaterally  . Gum surgery ~ 2004    graft to gums from roof of mouth.  . Balloon dilation 09/01/2012    Procedure: BALLOON DILATION;  Surgeon: Willis Modena, MD;  Location: [facility];  Service: Endoscopy;  Laterality: N/A;  . Adenoidectomy and myringotomy with tube placement 11/1996    bilaterally    Prior to Admission medications   Medication Sig Start Date End Date Taking? Authorizing Provider  cetirizine (ZYRTEC) 10 MG tablet Take 10 mg by mouth daily.    Historical Provider, MD  Dexlansoprazole 30 MG capsule Take 30 mg by mouth daily.    Historical Provider, MD  feeding supplement (RESOURCE BREEZE) LIQD Take 1 Container by mouth 2 (two) times daily between meals. 05/04/12   Stephani Police, PA  ibuprofen (ADVIL,MOTRIN) 200 MG tablet Take 800 mg by mouth every 8 (eight) hours as needed. For pain    Historical Provider, MD  levothyroxine (SYNTHROID, LEVOTHROID) 88 MCG tablet Take 88 mcg by mouth daily.      Historical Provider, MD  metoCLOPramide (REGLAN) 5 MG/5ML solution Take 5 mLs (5 mg total) by mouth 4 (four) times daily -  before meals and at bedtime. 09/01/12   Willis Modena, MD  nystatin (MYCOSTATIN) 100000 UNIT/ML suspension Take 500,000 Units by mouth 2 (two)  times daily.    Historical Provider, MD      Family History  Problem Relation Age of Onset  . Irritable bowel syndrome Mother   . Interstitial cystitis Mother   . Gallbladder disease Father     gallbladder surgery  . Migraines Sister   . Alzheimer's disease Maternal Grandmother     dementia.    Review of Systems:  Unobtainable rest as per HPI   Physical Exam: Blood pressure 114/76, pulse 97, temperature 98 F (36.7 C), temperature source Oral, resp. rate 18, height 4\' 10"  (1.473 m), weight 48.127 kg (106 lb 1.6 oz), SpO2 98.00%. Constitutional:   Patient is caucasian male with down syndrome  in no acute distress and cooperative with exam. Head: Normocephalic and atraumatic Mouth: Mucus  membranes moist Eyes: PERRL, EOMI, conjunctivae normal Neck: Supple, No Thyromegaly Cardiovascular: RRR, S1 normal, S2 normal Pulmonary/Chest: CTAB, no wheezes, rales, or rhonchi Abdominal: Soft. Mild epigastric tenderness, non-distended, bowel sounds are normal, no masses, organomegaly, or guarding present.  Neurological: A&O x3, Strenght is normal and symmetric bilaterally, cranial nerve II-XII are grossly intact, no focal motor deficit, sensory intact to light touch bilaterally.  Extremities : No Cyanosis, Clubbing or Edema   Labs on Admission:  No results found for this or any previous visit (from the past 48 hour(s)).  Radiological Exams on Admission: Dg Esophagus  09/30/2012  *RADIOLOGY REPORT*  Clinical Data:  21 year old with progressive dysphagia and odynaphagia.  History of Downs syndrome, oral thrush and endoscopy 09/01/2012 demonstrating esophageal dysmotility.  ESOPHOGRAM/BARIUM SWALLOW  Technique:  Single contrast examination was performed using thin barium.  Fluoroscopy time:  1.6 minutes.  Comparison:  Esophagram 07/14/2012.  Chest CT 05/02/2012.  Findings:  Study was initiated as an upper GI series.  A scout abdominal radiograph demonstrates a normal bowel gas pattern and no suspicious calcifications.  There are deformities of both hips consistent with Legg-Calve-Perthes disease.  Study was initiated with the patient in the right lateral decubitus position. There is poor esophageal motility with an absent primary stripping wave.  Barium did not pass beyond the distal esophagus. There is questionable mild mucosal irregularity initially, although this is not conclusively demonstrated on the later images. There is mild dilatation of the proximal thoracic esophagus, but no diverticulum as suggested on prior chest CT.  The patient was then placed erect.  In the erect position, some contrast does pass through the gastroesophageal junction into the stomach. A small amount of additional  barium was administered.  No ulceration or focal stricture is seen at the gastroesophageal junction.  There is limited distension of the stomach and no passage of barium into the small bowel.  A full upper GI series was not performed.  IMPRESSION:  1.  Significant esophageal dysmotility with limited esophageal emptying when supine.  The patient was unable to tolerate a full upper GI series. 2.  Questionable mucosal irregularity initially is not clearly seen on subsequent air-contrast images.  Correlate clinically regarding the possibility of recurrent candidiasis. 3.  No focal stricture or diverticulum identified.  Findings were discussed by telephone with Dr. Dulce Sellar.   Original Report Authenticated By: Carey Bullocks, M.D.     Assessment/Plan  Dysphagia Patient will be kept NPO EGD in am [facility] following  Candida esophagitis Will continue on nystatin  Hypothyroidism Continue synthroid  Poor po intake Start IV fluids Nutrition consult   Time Spent on Admission: 65 min  Jenavive Lamboy S [facility] Pager: [phone] 09/30/2012, 5:43 PM

## 2012-09-30 NOTE — Progress Notes (Signed)
NURSING PROGRESS NOTE  [name] [id number] Admission Data: 09/30/2012 4:47 PM Attending Provider: Zannie Cove, MD WUJ:WJXBJ,YNW A, MD Code Status: full  Ronald Le is a 21 y.o. male patient admitted from home. No acute distress noted. Pt states belly pain/tender to touch. No c/o shortness of breath, no c/o chest pain.  MD paged for admission orders. Will monitor pt and await orders from MD.   Blood pressure 114/76, pulse 97, temperature 98 F (36.7 C), temperature source Oral, resp. rate 18, height 4\' 10"  (1.473 m), weight 48.127 kg (106 lb 1.6 oz), SpO2 98.00%.   Allergies:  Review of patient's allergies indicates no known allergies.  Past Medical History:   has a past medical history of Allergy; Hypothyroidism; Legg-Calve-Perthes disease; Sleep apnea; Down's syndrome; History of pneumonia; Rash; Viral syndrome (09/1995); Chronic serous otitis media; Scoliosis (6/99); Finger fracture (2009); and Candida esophagitis (04/2012).  Past Surgical History: pt has down syndrome. Pt   has past surgical history that includes Hip Arthroplasty; Tympanostomy tube placement; Gum surgery; and Balloon dilation (09/01/2012).   Social History:   reports that he has never smoked. He does not have any smokeless tobacco history on file. He reports that he does not drink alcohol or use illicit drugs.  Skin: intact  Orientation to room, and floor completed with information packet given to patient/family. Admission INP armband ID verified with patient/family, and in place.   SR up x 2, fall assessment complete, with patient and family able to verbalize understanding of risk associated with falls, and verbalized understanding to call for assistance before getting out of bed.   Call light within reach. Patient is mentally challenged and family will be bedside to assist and interpret for son due to pt inability to vocalize answers other than yes or no per family.     Will cont to eval and  treat per MD orders.  Rosalie Doctor, RN

## 2012-10-01 ENCOUNTER — Encounter (HOSPITAL_COMMUNITY): Payer: Self-pay | Admitting: *Deleted

## 2012-10-01 ENCOUNTER — Encounter (HOSPITAL_COMMUNITY)
Admission: AD | Disposition: A | Discharge status: Home / Self Care | Payer: Self-pay | Source: Ambulatory Visit | Attending: Internal Medicine

## 2012-10-01 DIAGNOSIS — R1310 Dysphagia, unspecified: Principal | ICD-10-CM

## 2012-10-01 HISTORY — PX: ESOPHAGOGASTRODUODENOSCOPY: SHX1529

## 2012-10-01 HISTORY — PX: ESOPHAGOGASTRODUODENOSCOPY: SHX5428

## 2012-10-01 LAB — COMPREHENSIVE METABOLIC PANEL
Albumin: 3.4 g/dL — ABNORMAL LOW (ref 3.5–5.2)
GFR calc Af Amer: >90 mL/min (ref >90)
GFR calc non Af Amer: >90 mL/min (ref >90)
Potassium: 3.7 mEq/L (ref 3.5–5.1)
Sodium: 139 mEq/L (ref 135–145)
Total Bilirubin: 0.7 mg/dL (ref 0.3–1.2)
Total Protein: 6.6 g/dL (ref 6.0–8.3)

## 2012-10-01 LAB — CBC
Hemoglobin: 14.5 g/dL (ref 13.0–17.0)
MCHC: 35 g/dL (ref 30.0–36.0)
Platelets: 148 10*3/uL — ABNORMAL LOW (ref 150–400)
RDW: 13.1 % (ref 11.5–15.5)

## 2012-10-01 LAB — TSH: TSH: 0.701 u[IU]/mL (ref 0.350–4.500)

## 2012-10-01 SURGERY — EGD (ESOPHAGOGASTRODUODENOSCOPY)
Anesthesia: Moderate Sedation

## 2012-10-01 MED ORDER — MIDAZOLAM HCL 10 MG/2ML IJ SOLN
INTRAMUSCULAR | Status: DC | PRN
  Administered 2012-10-01: 1 mg via INTRAVENOUS
  Administered 2012-10-01 (×2): 2 mg via INTRAVENOUS

## 2012-10-01 MED ORDER — MAGIC MOUTHWASH W/LIDOCAINE
10.0000 mL | Freq: Three times a day (TID) | ORAL | Status: DC
  Administered 2012-10-01 – 2012-10-02 (×3): 10 mL via ORAL
  Filled 2012-10-01 (×5): qty 10

## 2012-10-01 MED ORDER — FENTANYL CITRATE 0.05 MG/ML IJ SOLN
INTRAMUSCULAR | Status: AC
  Filled 2012-10-01: qty 2

## 2012-10-01 MED ORDER — MIDAZOLAM HCL 5 MG/ML IJ SOLN
INTRAMUSCULAR | Status: AC
  Filled 2012-10-01: qty 2

## 2012-10-01 MED ORDER — SODIUM CHLORIDE 0.9 % IV SOLN: INTRAVENOUS | Status: DC

## 2012-10-01 MED ORDER — FENTANYL CITRATE 0.05 MG/ML IJ SOLN
INTRAMUSCULAR | Status: DC | PRN
  Administered 2012-10-01 (×2): 25 ug via INTRAVENOUS

## 2012-10-01 MED ORDER — BUTAMBEN-TETRACAINE-BENZOCAINE 2-2-14 % EX AERO
INHALATION_SPRAY | CUTANEOUS | Status: DC | PRN
  Administered 2012-10-01: 2 via TOPICAL

## 2012-10-01 NOTE — Interval H&P Note (Signed)
History and Physical Interval Note:  10/01/2012 10:13 AM  Ronald Le  has presented today for surgery, with the diagnosis of N+V  The various methods of treatment have been discussed with the patient and family. After consideration of risks, benefits and other options for treatment, the patient has consented to  Procedure(s) (LRB) with comments: ESOPHAGOGASTRODUODENOSCOPY (EGD) (N/A) as a surgical intervention .  The patient's history has been reviewed, patient examined, no change in status, stable for surgery.  I have reviewed the patient's chart and labs.  Questions were answered to the patient's satisfaction.     Seaira Byus JR,Corinne Goucher L

## 2012-10-01 NOTE — Op Note (Signed)
[facility] [address] [place], [address]   ENDOSCOPY PROCEDURE REPORT  PATIENT: Ronald, Le  MR#: [id number] BIRTHDATE: 1991/04/22 , 20  yrs. old GENDER: Male ENDOSCOPIST:Syriah Delisi Randa Evens, MD REFERRED BY:  Dr. Dulce Sellar PROCEDURE DATE:  10/01/2012 PROCEDURE:    EGD ASA CLASS:   class III INDICATIONS:   young man with Down syndrome who has had persistent nausea and vomiting. Barium studies have suggested dysmotility of the esophagus. At times he has done well he comes back now regurgitating and vomiting his food. He has been on empiric trial of Reglan in the past without improvement. MEDICATION:    fentanyl 50 mcg Versed 5 mg IV TOPICAL ANESTHETIC:    Cetacaine spray  DESCRIPTION OF PROCEDURE:   Procedure explaining to parents and consent obtained. Pentax scope inserted with swallowing easily into the esophagus. No material at all into the esophagus. The mucosa was completely normal the GE junction appeared normal. The scope easily passed. The EG junction opened with air insufflation. The stomach was entered there was some solid material seen in the stomach consistent with food residue. This was located up in the fundus. A retroflexed view was grossly normal. The antrum Lenard Galloway were normal as were the duodenal bulb and second duodenum. The scope was withdrawn and the patient tolerated the procedure well.     COMPLICATIONS: None  ENDOSCOPIC IMPRESSION: 1. Chronic regurgitation of food with nausea and vomiting. His quite difficult in this mentally impaired young man to determine where the problem is. Barium studies have suggested esophageal dysmotility and possible achalasia. At times his been able to swallow well but problem seemed to be getting worse.  RECOMMENDATIONS: 1. We'll go ahead and obtain a speech pathology evaluation. 2. After speech pathology is seen may consider soft diet .3. We'll need a esophageal manometry at  some point in the future.    _______________________________ Rosalie DoctorCarman Ching, MD 10/01/2012 10:51 AM CC: Dr. Lynelle Doctor    PATIENT NAME:  Ronald, Le MR#: [id number]

## 2012-10-01 NOTE — H&P (View-Only) (Signed)
Patient interval history reviewed.  Patient examined again.  There has been no change from documented H/P dated 08/20/12 (scanned into chart from our office) except as documented above.  Assessment:  1.  Dysphagia. 2.  Nausea and vomiting. 3.  Abnormal barium esophagram.  Plan:  1.  Upper endoscopy with possible balloon esophageal dilatation. 2.  Risks (bleeding, infection, bowel perforation that could require surgery, sedation-related changes in cardiopulmonary systems), benefits (identification and possible treatment of source of symptoms, exclusion of certain causes of symptoms), and alternatives (watchful waiting, radiographic imaging studies, empiric medical treatment) of upper endoscopy with possible esophageal balloon dilatation (EGD +/- DIL) were explained to patient/mother in detail and they wish to proceed.

## 2012-10-01 NOTE — Progress Notes (Signed)
Patient ID: Ronald Le, male   DOB: 1991-10-31, 21 y.o.   MRN: [id number] [facility] PROGRESS NOTE  Ronald Le OVF:[id number] DOB: 07-29-1991 DOA: 09/30/2012 PCP: Lavonda Jumbo, MD  Assessment/Plan  Dysphagia  Ronald Le has been able to swallow and retain only thin liquids per his father's report.  He vomits even full liquids. He has been having difficulty swallowing since he had a yeast infection in his esophagus after having pneumonia in June. Barium swallow revealed significant dysmotility Upper endoscopy, performed today by Dr. Randa Evens, showed no source of odynaphagia Speech therapy has been consulted Pending their recommendations his diet may be advanced. Consideration is being given to outpatient referral to motility specialists at [facility].  Candida esophagitis  Recent history of Candida esophagitis, but none seen today on upper EGD. Will  discontinue nystatin Order Magic mouthwash for pain relief  Hypothyroidism  Continue synthroid   Code Status: full Family Communication: father at bedside Disposition Plan: To home when appropriate.    Consultants:  [facility]  Procedures:  Upper Endoscopy  Antibiotics:  none  HPI/Subjective: 21 y.o. male with h/o dysphagia, fungal esophagitis who was seen at Dr Hulen Shouts office today andon esophagram was found to have significant dysmotility of the esophagus. Patient was sent from the office for admission and EGD is planned for the morning. Patient has not been eating and drinking much for past few days. Patient has Down's syndrome and is a poor historian. Family provides most of the history. He complains of epigastric discomfort. No vomiting, no chest pain, no shortness of breath.   Objective: Filed Vitals:   10/01/12 1030 10/01/12 1040 10/01/12 1050 10/01/12 1100  BP: 104/60 114/67 119/65 114/70  Pulse:      Temp: 98.1 F (36.7 C)     TempSrc: Oral     Resp: 18 20 17  18   Height:      Weight:      SpO2: 98% 99% 98% 96%    Intake/Output Summary (Last 24 hours) at 10/01/12 1157 Last data filed at 10/01/12 8416  Gross per 24 hour  Intake 1053.75 ml  Output      0 ml  Net 1053.75 ml   Filed Weights   09/30/12 1640  Weight: 48.127 kg (106 lb 1.6 oz)    Exam:   General:  Lying in bed, NAD, appears about 21 years old. Doesn't speak, but gives me a thumbs up.    HEENT:  No obvious oral thrush on exam, neck supple, no lymphadenopathy.  Cardiovascular: RRR no M/R/G  Respiratory: CTA no W/C/R  Abdomen: Thin,NT, ND, no obvious masses, +BS  Data Reviewed: Basic Metabolic Panel:  Lab 10/01/12 6063 09/30/12 1824  NA 139 140  K 3.7 3.9  CL 104 103  CO2 26 27  GLUCOSE 94 101*  BUN 12 13  CREATININE 0.94 1.10  CALCIUM 9.1 9.5  MG -- 2.2  PHOS -- --   Liver Function Tests:  Lab 10/01/12 0545 09/30/12 1824  AST 15 17  ALT 11 14  ALKPHOS 73 88  BILITOT 0.7 0.8  PROT 6.6 7.5  ALBUMIN 3.4* 4.0   CBC:  Lab 10/01/12 0545 09/30/12 1824  WBC 8.7 19.1*  NEUTROABS -- --  HGB 14.5 16.5  HCT 41.4 46.2  MCV 90.4 89.7  PLT 148* 168     Studies: Dg Esophagus  09/30/2012  *RADIOLOGY REPORT*  Clinical Data:  21 year old with progressive dysphagia and odynaphagia.  History of Downs syndrome,  oral thrush and endoscopy 09/01/2012 demonstrating esophageal dysmotility.  ESOPHOGRAM/BARIUM SWALLOW  Technique:  Single contrast examination was performed using thin barium.  Fluoroscopy time:  1.6 minutes.  Comparison:  Esophagram 07/14/2012.  Chest CT 05/02/2012.  Findings:  Study was initiated as an upper GI series.  A scout abdominal radiograph demonstrates a normal bowel gas pattern and no suspicious calcifications.  There are deformities of both hips consistent with Legg-Calve-Perthes disease.  Study was initiated with the patient in the right lateral decubitus position. There is poor esophageal motility with an absent primary stripping wave.  Barium  did not pass beyond the distal esophagus. There is questionable mild mucosal irregularity initially, although this is not conclusively demonstrated on the later images. There is mild dilatation of the proximal thoracic esophagus, but no diverticulum as suggested on prior chest CT.  The patient was then placed erect.  In the erect position, some contrast does pass through the gastroesophageal junction into the stomach. A small amount of additional barium was administered.  No ulceration or focal stricture is seen at the gastroesophageal junction.  There is limited distension of the stomach and no passage of barium into the small bowel.  A full upper GI series was not performed.  IMPRESSION:  1.  Significant esophageal dysmotility with limited esophageal emptying when supine.  The patient was unable to tolerate a full upper GI series. 2.  Questionable mucosal irregularity initially is not clearly seen on subsequent air-contrast images.  Correlate clinically regarding the possibility of recurrent candidiasis. 3.  No focal stricture or diverticulum identified.  Findings were discussed by telephone with Dr. Dulce Sellar.   Original Report Authenticated By: Carey Bullocks, M.D.     Scheduled Meds:   . [facility] HOLD] levothyroxine  88 mcg Oral QAC breakfast  . [MAR HOLD] nystatin  500,000 Units Oral BID  . [MAR HOLD] pantoprazole (PROTONIX) IV  40 mg Intravenous Q24H   Continuous Infusions:   . sodium chloride    . sodium chloride 75 mL/hr at 10/01/12 0648  . sodium chloride      Active Problems:  Dysphagia    Time spent: 20 min.    Stephani Police  [facility] Pager [phone]. If 8PM-8AM, please contact night-coverage at [email or web address], password [facility] 10/01/2012, 11:57 AM  LOS: 1 day    Attending Patient seen and examined, agree with assessment and plan. EGD done today-unrevealing, await speech eval.   S Crysten Kaman

## 2012-10-01 NOTE — Progress Notes (Signed)
INITIAL ADULT NUTRITION ASSESSMENT Date: 10/01/2012   Time: 11:44 AM Reason for Assessment: Consult   INTERVENTION: 1. Will add nutrition supplements as needed after seen by speech 2. RD will continue to follow    DOCUMENTATION CODES Per approved criteria  -Not Applicable    ASSESSMENT: Male 21 y.o.  Dx: dysphagia   Hx:  Past Medical History  Diagnosis Date  . Allergy   . Hypothyroidism   . Legg-Calve-Perthes disease   . Down's syndrome   . Rash     hospitalized as a child, etiology latent viral exanthem  . Viral syndrome 09/1995    hospital viral syndrome.  . Chronic serous otitis media   . Scoliosis 6/99    leg length discrepancy.  . Finger fracture 2009    h/o  . Candida esophagitis 04/2012    after ABX for pneumonia  . Complication of anesthesia 08/2012    "hard to wake up after balloon dilatation)  . Complication of anesthesia 1999    "respiratory arrest after hip OR; had to get Narcan" (09/30/2012)  . Heart murmur     "@ birth; resolved" (09/30/2012)  . Pneumonia     multiple hospitalizatons as toddler, but 10 day stay age 65yo  . GERD (gastroesophageal reflux disease)     "maybe" (09/30/2012)  . Headache     "recently; maybe from being dehydrated" (09/30/2012)  . Hearing loss, bilateral   . Peristalsis     "almost absent" (09/30/2012)  . Dysphagia 09/30/2012  . OSA on CPAP   . Communication problem     "when he feels bad he just communicates w/thumbs up or down or shakes his head; very limited communication normally" (09/30/2012)    Past Surgical History  Procedure Date  . Hip arthroplasty 10/1998    left; due to Legg-Calve-Perthe dz and subsequent surgery to remove hardware  . Tympanostomy tube placement 11/1996    bilaterally  . Gum surgery ~ 2004    graft to gums from roof of mouth.  . Balloon dilation 09/01/2012    Procedure: BALLOON DILATION;  Surgeon: Willis Modena, MD;  Location: [facility];  Service: Endoscopy;  Laterality: N/A;  .  Adenoidectomy and myringotomy with tube placement 11/1996    bilaterally    Related Meds:     . [facility] HOLD] levothyroxine  88 mcg Oral QAC breakfast  . [MAR HOLD] nystatin  500,000 Units Oral BID  . [facility] HOLD] pantoprazole (PROTONIX) IV  40 mg Intravenous Q24H     Ht: 4\' 10"  (147.3 cm)  Wt: 106 lb 1.6 oz (48.127 kg)  Ideal Wt: 43.9 kg  % Ideal Wt: 109%  Usual Wt:  Wt Readings from Last 10 Encounters:  09/30/12 106 lb 1.6 oz (48.127 kg)  09/30/12 106 lb 1.6 oz (48.127 kg)  09/30/12 106 lb 1.6 oz (48.127 kg)  09/01/12 105 lb (47.628 kg)  09/01/12 105 lb (47.628 kg)  05/26/12 105 lb (47.628 kg)  05/19/12 102 lb (46.267 kg)  05/12/12 107 lb (48.535 kg)  05/01/12 111 lb 5.3 oz (50.5 kg)  04/29/12 108 lb (48.988 kg)    % Usual Wt: ~100%  Body mass index is 22.18 kg/(m^2). Pt is WNL per current BMI   Food/Nutrition Related Hx: Pt with hx of dysphagia.   Labs:  CMP     Component Value Date/Time   NA 139 10/01/2012 0545   K 3.7 10/01/2012 0545   CL 104 10/01/2012 0545   CO2 26 10/01/2012 0545   GLUCOSE  94 10/01/2012 0545   BUN 12 10/01/2012 0545   CREATININE 0.94 10/01/2012 0545   CALCIUM 9.1 10/01/2012 0545   PROT 6.6 10/01/2012 0545   ALBUMIN 3.4* 10/01/2012 0545   AST 15 10/01/2012 0545   ALT 11 10/01/2012 0545   ALKPHOS 73 10/01/2012 0545   BILITOT 0.7 10/01/2012 0545   GFRNONAA >90 10/01/2012 0545   GFRAA >90 10/01/2012 0545      Intake/Output Summary (Last 24 hours) at 10/01/12 1148 Last data filed at 10/01/12 4098  Gross per 24 hour  Intake 1053.75 ml  Output      0 ml  Net 1053.75 ml     Diet Order: NPO  Supplements/Tube Feeding: none   IVF:    sodium chloride   sodium chloride Last Rate: 75 mL/hr at 10/01/12 0648  sodium chloride     Estimated Nutritional Needs:   Kcal: 1550-1750  Protein: 50-60 gm  Fluid:  1.6-1.8 L   Pt with Downs syndrome, admitted with increased dysphagia. Pt has hx of dysphagia of unknown origin.  Pt just back from EGD at this  time, sleeping with no family present.  MST (Malnutrition Screening Tool) reports 8 lbs weight loss, per weight hx pt has had stable weight. Still NPO post procedure. Per notes, SLP consult for most appropriate diet. Planned to have manometry at some point in the future.   RD will follow up and order nutrition supplements as needed after has been seen by speech.   NUTRITION DIAGNOSIS: -Swallowing difficulty (NI-1.1).  Status: Ongoing  RELATED TO: unknown etiology  AS EVIDENCE BY: difficulty swallowing   MONITORING/EVALUATION(Goals): Goal: diet advance to safely meet >/=90% estimated nutrition needs  Monitor: diet advance, weight, labs  EDUCATION NEEDS: -No education needs identified at this time    Clarene Duke RD, LDN Pager [phone] After Hours pager [phone]  10/01/2012, 11:44 AM

## 2012-10-02 MED ORDER — PANTOPRAZOLE SODIUM 40 MG PO TBEC: 40.0000 mg | DELAYED_RELEASE_TABLET | Freq: Every day | ORAL | Status: DC

## 2012-10-02 MED ORDER — PANTOPRAZOLE SODIUM 40 MG PO TBEC
40.0000 mg | DELAYED_RELEASE_TABLET | Freq: Every day | ORAL | Status: DC
  Administered 2012-10-02: 40 mg via ORAL
  Filled 2012-10-02: qty 1

## 2012-10-02 MED ORDER — BOOST / RESOURCE BREEZE PO LIQD
1.0000 | Freq: Three times a day (TID) | ORAL | Status: DC
  Administered 2012-10-02: 1 via ORAL

## 2012-10-02 MED ORDER — BOOST / RESOURCE BREEZE PO LIQD: 1.0000 | Freq: Three times a day (TID) | ORAL | Status: DC

## 2012-10-02 MED ORDER — MAGIC MOUTHWASH W/LIDOCAINE: 10.0000 mL | Freq: Three times a day (TID) | ORAL | Status: DC | PRN

## 2012-10-02 NOTE — Progress Notes (Signed)
Nutrition Consult/Follow-up  Intervention:    Resource Breeze supplement 3 times daily between meals (250 kcals, 9 gm protein per 8 fl oz carton RD to follow for nutrition care plan  Assessment:   Initial nutrition assessment 11/8. S/p bedside swallow evaluation today. S/p EGD 11/8. RD spoke with patient's father regarding supplements to optimize nutritional status -- recommended Resource Breeze or Ensure Clear if he is to remain on Clear Liquids.  Diet Order:  Clear Liquids  Meds: Scheduled Meds:   . levothyroxine  88 mcg Oral QAC breakfast  . magic mouthwash w/lidocaine  10 mL Oral TID  . pantoprazole  40 mg Oral Q1200  . [DISCONTINUED] nystatin  500,000 Units Oral BID  . [DISCONTINUED] pantoprazole (PROTONIX) IV  40 mg Intravenous Q24H   Continuous Infusions:   . [DISCONTINUED] sodium chloride 20 mL/hr at 10/02/12 0956  . [DISCONTINUED] sodium chloride 75 mL/hr at 10/01/12 2030  . [DISCONTINUED] sodium chloride     PRN Meds:.morphine injection, ondansetron (ZOFRAN) IV, ondansetron   CMP     Component Value Date/Time   NA 139 10/01/2012 0545   K 3.7 10/01/2012 0545   CL 104 10/01/2012 0545   CO2 26 10/01/2012 0545   GLUCOSE 94 10/01/2012 0545   BUN 12 10/01/2012 0545   CREATININE 0.94 10/01/2012 0545   CALCIUM 9.1 10/01/2012 0545   PROT 6.6 10/01/2012 0545   ALBUMIN 3.4* 10/01/2012 0545   AST 15 10/01/2012 0545   ALT 11 10/01/2012 0545   ALKPHOS 73 10/01/2012 0545   BILITOT 0.7 10/01/2012 0545   GFRNONAA >90 10/01/2012 0545   GFRAA >90 10/01/2012 0545     Intake/Output Summary (Last 24 hours) at 10/02/12 1049 Last data filed at 10/01/12 2300  Gross per 24 hour  Intake    764 ml  Output    700 ml  Net     64 ml    Weight Status:  48.1 kg (11/7) -- no new weight available  Re-estimated needs:  1300-1500 kcals, 60-70 gm protein  Nutrition Dx:  Swallowing difficulty r/ esophageal dysmotility, possible achalasia as evidenced by EGD report, onoing  Goal:  Oral intake with  meals & supplements to meet >/= 90% of estimated nutrition needs, progressing  Monitor:  PO & supplemental intake, weight, labs, I/O's  Kirkland Hun, RD, LDN Pager #: [phone] After-Hours Pager #: [phone]

## 2012-10-02 NOTE — Progress Notes (Signed)
[facility] PROGRESS NOTE Subjective Speech pathology evaluation no clear oral pharyngeal dysphagia with liquids they did not give solids. The patient has been tolerating liquids here in the hospital and apparently is consuming large quantities without vomiting.  Objective: Vital signs in last 24 hours: Temp:  [97.5 F (36.4 C)-97.8 F (36.6 C)] 97.7 F (36.5 C) (11/09 0641) Pulse Rate:  [53-83] 60  (11/09 0655) Resp:  [14-18] 16  (11/09 0641) BP: (83-97)/(33-62) 92/62 mmHg (11/09 0655) SpO2:  [98 %] 98 % (11/09 0641) Last BM Date: 09/30/12  Intake/Output from previous day: 11/08 0701 - 11/09 0700 In: 764 [P.O.:464; I.V.:300] Out: 700 [Urine:700] Intake/Output this shift:    PE: No change  Lab Results:  [facility] 10/01/12 0545 09/30/12 1824  WBC 8.7 19.1*  HGB 14.5 16.5  HCT 41.4 46.2  PLT 148* 168   BMET  Basename 10/01/12 0545 09/30/12 1824  NA 139 140  K 3.7 3.9  CL 104 103  CO2 26 27  CREATININE 0.94 1.10   LFT  Basename 10/01/12 0545 09/30/12 1824  PROT 6.6 7.5  AST 15 17  ALT 11 14  ALKPHOS 73 88  BILITOT 0.7 0.8  BILIDIR -- --  IBILI -- --   PT/INR No results found for this basename: LABPROT:3,INR:3 in the last 72 hours PANCREAS No results found for this basename: LIPASE:3 in the last 72 hours       Studies/Results: No results found.  Medications: I have reviewed the patient's current medications.  Assessment/Plan: 1. Dysphagia. May be due to a neuromuscular problem such as achalasia. Patient's tolerating liquids well. Agree with discharge home. We will discuss with Dr. Dulce Sellar. He is planning to send him to a [facility] for manometry and further evaluation and treatment. I have had a long discussion with the patient father. Would recommend that he remain on any type of liquid and have encouraged ice cream, scrambled eggs, mashed potato consistency food consumed large quantities of liquid in the sitting position. They will avoid  going out to eat which is where a lot of problems have occurred. Dr. Hulen Shouts office will contact him with his arrangements next week.   Courtenay Creger JR,Jaicee Michelotti L 10/02/2012, 11:38 AM

## 2012-10-02 NOTE — Evaluation (Addendum)
Clinical/Bedside Swallow Evaluation Patient Details  Name: Ronald Le MRN: [id number] Date of Birth: 07-28-1991  Today's Date: 10/02/2012 Time: 7829-5621 SLP Time Calculation (min): 24 min  Past Medical History:  Past Medical History  Diagnosis Date  . Allergy   . Hypothyroidism   . Legg-Calve-Perthes disease   . Down's syndrome   . Rash     hospitalized as a child, etiology latent viral exanthem  . Viral syndrome 09/1995    hospital viral syndrome.  . Chronic serous otitis media   . Scoliosis 6/99    leg length discrepancy.  . Finger fracture 2009    h/o  . Candida esophagitis 04/2012    after ABX for pneumonia  . Complication of anesthesia 08/2012    "hard to wake up after balloon dilatation)  . Complication of anesthesia 1999    "respiratory arrest after hip OR; had to get Narcan" (09/30/2012)  . Heart murmur     "@ birth; resolved" (09/30/2012)  . Pneumonia     multiple hospitalizatons as toddler, but 10 day stay age 23yo  . GERD (gastroesophageal reflux disease)     "maybe" (09/30/2012)  . Headache     "recently; maybe from being dehydrated" (09/30/2012)  . Hearing loss, bilateral   . Peristalsis     "almost absent" (09/30/2012)  . Dysphagia 09/30/2012  . OSA on CPAP   . Communication problem     "when he feels bad he just communicates w/thumbs up or down or shakes his head; very limited communication normally" (09/30/2012)   Past Surgical History:  Past Surgical History  Procedure Date  . Hip arthroplasty 10/1998    left; due to Legg-Calve-Perthe dz and subsequent surgery to remove hardware  . Tympanostomy tube placement 11/1996    bilaterally  . Gum surgery ~ 2004    graft to gums from roof of mouth.  . Balloon dilation 09/01/2012    Procedure: BALLOON DILATION;  Surgeon: Willis Modena, MD;  Location: [facility];  Service: Endoscopy;  Laterality: N/A;  . Adenoidectomy and myringotomy with tube placement 11/1996    bilaterally   HPI:  21 yo male  with Down's syndrome adm to [facility] with worsening dysphagia.   PMH + for pna in June of this year, s/p ABX tx with resultant thrush per father and progressive dysphagia.  Pt has undergone several swallow evaluations with GI, esophagram 07/14/12 showed normal swallow mechanism *oropharyngeal* and esophageal deficits.  Pt has undergone balloon dilatation 09/01/12, barium swallow during this hospital course showed limited emptying in supine position.  Per father, pt had to stand to clear his esophagus.   EGD completed showed food residual in the stomach, esophagrams indicating possible achalasia, motility issues.  Per father, pt is to go to [facility] for manometry studies.  MD ordered clinical swallow evaluation.     Assessment / Plan / Recommendation Clinical Impression  Pt appears with normal oropharyngeal swallow function - no cranial nerve deficits noted.  As pt's dysphagia is esophageal, advised father to defer solid food recommendations to GI.  SLP did not administer solid foods.  Per father, pt regurgitated mashed potatoes over the weekend but is tolerating his po medications with water.  Advised father to consider pt consuming small amounts throughout the day, rather than 3 meals.  Also advised if having pain with intake and receiving magic mouthwash (which pt states has mild improvement)  may consider taking this before consuming his liquid meals.  Father states he could not locate [facility]  outside of hospital, but advised him to look online.  Pt has been consuming specialK drinks at home as well.  Father inquiring as to nutritional supplement amounts to ensure adequate calories/intake- advised father to speak to RD.   SLP to sign off, please reorder if desire.      Aspiration Risk   (due to esophageal issues)    Diet Recommendation Thin liquid (continue liquids )   Liquid Administration via: Straw;Cup Medication Administration: Whole meds with liquid Supervision: Patient able to self feed;Full  supervision/cueing for compensatory strategies Compensations: Slow rate;Small sips/bites Postural Changes and/or Swallow Maneuvers: Seated upright 90 degrees;Upright 30-60 min after meal    Other  Recommendations Oral Care Recommendations: Oral care BID   Follow Up Recommendations  None    Frequency and Duration        Pertinent Vitals/Pain Afebrile, decreased    SLP Swallow Goals  n/a, education and d/c   Swallow Study Prior Functional Status       General HPI: 21 yo male with Down's syndrome adm to [facility] with worsening dysphagia.   PMH + for pna in June of this year, s/p ABX tx with resultant thrush per father and progressive dysphagia.  Pt has undergone several swallow evaluations with GI, esophagram 07/14/12 showed normal swallow mechanism *oropharyngeal* and esophageal deficits.  Pt has undergone balloon dilatation 09/01/12, barium swallow during this hospital course showed limited emptying in supine position.  Per father, pt had to stand to clear his esophagus.   EGD completed showed food residual in the stomach, esophagrams indicating possible achalasia, motility issues.  Per father, pt is to go to [facility] for manometry studies.  MD ordered clinical swallow evaluation.   Type of Study: Bedside swallow evaluation Diet Prior to this Study: Thin liquids (clears) Temperature Spikes Noted: No Respiratory Status: Room air Behavior/Cognition: Alert;Cooperative;Requires cueing (pt with decr comprehension due to down's syndrome) Oral Cavity - Dentition: Adequate natural dentition Self-Feeding Abilities: Able to feed self Patient Positioning: Upright in bed Baseline Vocal Quality: Clear Volitional Cough: Weak Volitional Swallow: Able to elicit    Oral/Motor/Sensory Function Overall Oral Motor/Sensory Function: Appears within functional limits for tasks assessed   Ice Chips Ice chips: Not tested   Thin Liquid Thin Liquid: Within functional limits Presentation: Self Fed;Straw    Nectar  Thick Nectar Thick Liquid: Not tested   Honey Thick Honey Thick Liquid: Not tested   Puree Puree: Not tested Other Comments: per father, pt consumed jello without difficulties   Solid   GO Functional Assessment Tool Used: clinical judgement Functional Limitations: Swallowing Swallow Current Status (Z6109): At least 1 percent but less than 20 percent impaired, limited or restricted Swallow Goal Status [id number]): At least 1 percent but less than 20 percent impaired, limited or restricted Swallow Discharge Status [phone]): At least 1 percent but less than 20 percent impaired, limited or restricted  Solid: Not tested       Donavan Burnet, MS [facility] SLP [phone]

## 2012-10-02 NOTE — Discharge Summary (Signed)
PATIENT DETAILS Name: Ronald Le Age: 21 y.o. Sex: male Date of Birth: 12-29-1990 MRN: [id number]. Admit Date: 09/30/2012 Admitting Physician: Zannie Cove, MD YNW:GNFAO,ZHY A, MD  Recommendations for Outpatient Follow-up:  1. Will need referral to [facility] clinic  PRIMARY DISCHARGE DIAGNOSIS:  Active Problems:  Dysphagia      PAST MEDICAL HISTORY: Past Medical History  Diagnosis Date  . Allergy   . Hypothyroidism   . Legg-Calve-Perthes disease   . Down's syndrome   . Rash     hospitalized as a child, etiology latent viral exanthem  . Viral syndrome 09/1995    hospital viral syndrome.  . Chronic serous otitis media   . Scoliosis 6/99    leg length discrepancy.  . Finger fracture 2009    h/o  . Candida esophagitis 04/2012    after ABX for pneumonia  . Complication of anesthesia 08/2012    "hard to wake up after balloon dilatation)  . Complication of anesthesia 1999    "respiratory arrest after hip OR; had to get Narcan" (09/30/2012)  . Heart murmur     "@ birth; resolved" (09/30/2012)  . Pneumonia     multiple hospitalizatons as toddler, but 10 day stay age 7yo  . GERD (gastroesophageal reflux disease)     "maybe" (09/30/2012)  . Headache     "recently; maybe from being dehydrated" (09/30/2012)  . Hearing loss, bilateral   . Peristalsis     "almost absent" (09/30/2012)  . Dysphagia 09/30/2012  . OSA on CPAP   . Communication problem     "when he feels bad he just communicates w/thumbs up or down or shakes his head; very limited communication normally" (09/30/2012)    DISCHARGE MEDICATIONS:   Medication List     As of 10/02/2012 11:46 AM    TAKE these medications         cetirizine 10 MG tablet   Commonly known as: ZYRTEC   Take 10 mg by mouth daily.      feeding supplement Liqd   Take 1 Container by mouth 3 (three) times daily between meals.      ibuprofen 200 MG tablet   Commonly known as: ADVIL,MOTRIN   Take 800 mg by mouth every 8 (eight)  hours as needed. For pain      levothyroxine 88 MCG tablet   Commonly known as: SYNTHROID, LEVOTHROID   Take 88 mcg by mouth daily.      magic mouthwash w/lidocaine Soln   Take 10 mLs by mouth 3 (three) times daily as needed.      metoCLOPramide 5 MG/5ML solution   Commonly known as: REGLAN   Take 5 mLs (5 mg total) by mouth 4 (four) times daily -  before meals and at bedtime.      pantoprazole 40 MG tablet   Commonly known as: PROTONIX   Take 1 tablet (40 mg total) by mouth daily at 12 noon.         BRIEF HPI:  See H&P, Labs, Consult and Test reports for all details in brief, patient was admitted for evaluation of Dysphagia.  CONSULTATIONS:  GI.  PERTINENT RADIOLOGIC STUDIES: Dg Esophagus  09/30/2012  *RADIOLOGY REPORT*  Clinical Data:  21 year old with progressive dysphagia and odynaphagia.  History of Downs syndrome, oral thrush and endoscopy 09/01/2012 demonstrating esophageal dysmotility.  ESOPHOGRAM/BARIUM SWALLOW  Technique:  Single contrast examination was performed using thin barium.  Fluoroscopy time:  1.6 minutes.  Comparison:  Esophagram 07/14/2012.  Chest CT 05/02/2012.  Findings:  Study was initiated as an upper GI series.  A scout abdominal radiograph demonstrates a normal bowel gas pattern and no suspicious calcifications.  There are deformities of both hips consistent with Legg-Calve-Perthes disease.  Study was initiated with the patient in the right lateral decubitus position. There is poor esophageal motility with an absent primary stripping wave.  Barium did not pass beyond the distal esophagus. There is questionable mild mucosal irregularity initially, although this is not conclusively demonstrated on the later images. There is mild dilatation of the proximal thoracic esophagus, but no diverticulum as suggested on prior chest CT.  The patient was then placed erect.  In the erect position, some contrast does pass through the gastroesophageal junction into the stomach.  A small amount of additional barium was administered.  No ulceration or focal stricture is seen at the gastroesophageal junction.  There is limited distension of the stomach and no passage of barium into the small bowel.  A full upper GI series was not performed.  IMPRESSION:  1.  Significant esophageal dysmotility with limited esophageal emptying when supine.  The patient was unable to tolerate a full upper GI series. 2.  Questionable mucosal irregularity initially is not clearly seen on subsequent air-contrast images.  Correlate clinically regarding the possibility of recurrent candidiasis. 3.  No focal stricture or diverticulum identified.  Findings were discussed by telephone with Dr. Dulce Sellar.   Original Report Authenticated By: Carey Bullocks, M.D.      PERTINENT LAB RESULTS: CBC:  Basename 10/01/12 0545 09/30/12 1824  WBC 8.7 19.1*  HGB 14.5 16.5  HCT 41.4 46.2  PLT 148* 168   CMET CMP     Component Value Date/Time   NA 139 10/01/2012 0545   K 3.7 10/01/2012 0545   CL 104 10/01/2012 0545   CO2 26 10/01/2012 0545   GLUCOSE 94 10/01/2012 0545   BUN 12 10/01/2012 0545   CREATININE 0.94 10/01/2012 0545   CALCIUM 9.1 10/01/2012 0545   PROT 6.6 10/01/2012 0545   ALBUMIN 3.4* 10/01/2012 0545   AST 15 10/01/2012 0545   ALT 11 10/01/2012 0545   ALKPHOS 73 10/01/2012 0545   BILITOT 0.7 10/01/2012 0545   GFRNONAA >90 10/01/2012 0545   GFRAA >90 10/01/2012 0545    GFR Estimated Creatinine Clearance: 80.5 ml/min (by C-G formula based on Cr of 0.94). No results found for this basename: LIPASE:2,AMYLASE:2 in the last 72 hours No results found for this basename: CKTOTAL:3,CKMB:3,CKMBINDEX:3,TROPONINI:3 in the last 72 hours No components found with this basename: POCBNP:3 No results found for this basename: DDIMER:2 in the last 72 hours No results found for this basename: HGBA1C:2 in the last 72 hours No results found for this basename: CHOL:2,HDL:2,LDLCALC:2,TRIG:2,CHOLHDL:2,LDLDIRECT:2 in the last 72  hours  Basename 09/30/12 1824  TSH 0.701  T4TOTAL --  T3FREE --  THYROIDAB --   No results found for this basename: VITAMINB12:2,FOLATE:2,FERRITIN:2,TIBC:2,IRON:2,RETICCTPCT:2 in the last 72 hours Coags: No results found for this basename: PT:2,INR:2 in the last 72 hours Microbiology: No results found for this or any previous visit (from the past 240 hour(s)).   BRIEF HOSPITAL COURSE:   Active Problems:  Dysphagia 2/2 motility disorder, EGD done 11/8-no candidiasis. Current plans are to refer to [facility] for further evaluation. Seen by SLP-diet recommendations given to father at bedside.  Rest of medical issues stable.   TODAY-DAY OF DISCHARGE:  Subjective:   Montrel Donahoe today has no headache,no chest abdominal pain,no new weakness tingling or numbness, feels much  better wants to go home today.   Objective:   Blood pressure 92/62, pulse 60, temperature 97.7 F (36.5 C), temperature source Oral, resp. rate 16, height 4\' 10"  (1.473 m), weight 48.127 kg (106 lb 1.6 oz), SpO2 98.00%.  Intake/Output Summary (Last 24 hours) at 10/02/12 1146 Last data filed at 10/01/12 2300  Gross per 24 hour  Intake    764 ml  Output    700 ml  Net     64 ml    Exam Awake Alert,  No new F.N deficits, Normal affect [place].AT,PERRAL Supple Neck,No JVD, No cervical lymphadenopathy appriciated.  Symmetrical Chest wall movement, Good air movement bilaterally, CTAB RRR,No Gallops,Rubs or new Murmurs, No Parasternal Heave +ve B.Sounds, Abd Soft, Non tender, No organomegaly appriciated, No rebound -guarding or rigidity. No Cyanosis, Clubbing or edema, No new Rash or bruise  DISCHARGE CONDITION: Stable  DISPOSITION:  HOME  DISCHARGE INSTRUCTIONS:    Activity:  As tolerated   Diet recommendation: Full LIQUID Diet-  Total Time spent on discharge equals 45 minutes.  SignedJeoffrey Massed 10/02/2012 11:46 AM

## 2012-10-04 ENCOUNTER — Encounter (HOSPITAL_COMMUNITY): Payer: Self-pay | Admitting: Gastroenterology

## 2012-10-08 ENCOUNTER — Encounter: Payer: Self-pay | Admitting: Internal Medicine

## 2012-11-08 ENCOUNTER — Encounter: Payer: Self-pay | Admitting: Family Medicine

## 2012-11-08 DIAGNOSIS — K220 Achalasia of cardia: Secondary | ICD-10-CM | POA: Insufficient documentation

## 2012-11-11 HISTORY — PX: LAPAROSCOPIC HELLER MYOTOMY: SUR780

## 2012-12-02 ENCOUNTER — Encounter: Payer: Self-pay | Admitting: Internal Medicine

## 2013-05-12 ENCOUNTER — Ambulatory Visit: Payer: Self-pay | Admitting: Family Medicine

## 2013-06-09 ENCOUNTER — Emergency Department (HOSPITAL_COMMUNITY): Payer: BC Managed Care – PPO

## 2013-06-09 ENCOUNTER — Encounter (HOSPITAL_COMMUNITY): Payer: Self-pay | Admitting: Family Medicine

## 2013-06-09 ENCOUNTER — Emergency Department (HOSPITAL_COMMUNITY)
Admission: EM | Admit: 2013-06-09 | Discharge: 2013-06-10 | Disposition: A | Discharge status: Home / Self Care | Payer: BC Managed Care – PPO | Attending: Emergency Medicine | Admitting: Emergency Medicine

## 2013-06-09 DIAGNOSIS — Z8781 Personal history of (healed) traumatic fracture: Secondary | ICD-10-CM | POA: Insufficient documentation

## 2013-06-09 DIAGNOSIS — Z872 Personal history of diseases of the skin and subcutaneous tissue: Secondary | ICD-10-CM | POA: Insufficient documentation

## 2013-06-09 DIAGNOSIS — B370 Candidal stomatitis: Secondary | ICD-10-CM | POA: Insufficient documentation

## 2013-06-09 DIAGNOSIS — R109 Unspecified abdominal pain: Secondary | ICD-10-CM | POA: Insufficient documentation

## 2013-06-09 DIAGNOSIS — R112 Nausea with vomiting, unspecified: Secondary | ICD-10-CM | POA: Insufficient documentation

## 2013-06-09 DIAGNOSIS — Z8739 Personal history of other diseases of the musculoskeletal system and connective tissue: Secondary | ICD-10-CM | POA: Insufficient documentation

## 2013-06-09 DIAGNOSIS — Q909 Down syndrome, unspecified: Secondary | ICD-10-CM | POA: Insufficient documentation

## 2013-06-09 DIAGNOSIS — Z8619 Personal history of other infectious and parasitic diseases: Secondary | ICD-10-CM | POA: Insufficient documentation

## 2013-06-09 DIAGNOSIS — F809 Developmental disorder of speech and language, unspecified: Secondary | ICD-10-CM | POA: Insufficient documentation

## 2013-06-09 DIAGNOSIS — Z8669 Personal history of other diseases of the nervous system and sense organs: Secondary | ICD-10-CM | POA: Insufficient documentation

## 2013-06-09 DIAGNOSIS — Z79899 Other long term (current) drug therapy: Secondary | ICD-10-CM | POA: Insufficient documentation

## 2013-06-09 DIAGNOSIS — E039 Hypothyroidism, unspecified: Secondary | ICD-10-CM | POA: Insufficient documentation

## 2013-06-09 DIAGNOSIS — G4733 Obstructive sleep apnea (adult) (pediatric): Secondary | ICD-10-CM | POA: Insufficient documentation

## 2013-06-09 DIAGNOSIS — Z8719 Personal history of other diseases of the digestive system: Secondary | ICD-10-CM | POA: Insufficient documentation

## 2013-06-09 DIAGNOSIS — Z8679 Personal history of other diseases of the circulatory system: Secondary | ICD-10-CM | POA: Insufficient documentation

## 2013-06-09 DIAGNOSIS — Z8701 Personal history of pneumonia (recurrent): Secondary | ICD-10-CM | POA: Insufficient documentation

## 2013-06-09 DIAGNOSIS — Z9989 Dependence on other enabling machines and devices: Secondary | ICD-10-CM | POA: Insufficient documentation

## 2013-06-09 LAB — CBC WITH DIFFERENTIAL/PLATELET
Basophils Absolute: 0.1 10*3/uL (ref 0.0–0.1)
Basophils Relative: 2 % — ABNORMAL HIGH (ref 0–1)
Eosinophils Absolute: 0 10*3/uL (ref 0.0–0.7)
HCT: 47.8 % (ref 39.0–52.0)
Hemoglobin: 17.4 g/dL — ABNORMAL HIGH (ref 13.0–17.0)
MCH: 33 pg (ref 26.0–34.0)
MCHC: 36.4 g/dL — ABNORMAL HIGH (ref 30.0–36.0)
Monocytes Absolute: 0.7 10*3/uL (ref 0.1–1.0)
Monocytes Relative: 11 % (ref 3–12)
Neutrophils Relative %: 75 % (ref 43–77)
RDW: 13.5 % (ref 11.5–15.5)

## 2013-06-09 LAB — URINALYSIS, ROUTINE W REFLEX MICROSCOPIC
Bilirubin Urine: NEGATIVE
Ketones, ur: 15 mg/dL — AB
Leukocytes, UA: NEGATIVE
Nitrite: NEGATIVE
Protein, ur: NEGATIVE mg/dL
pH: 6.5 (ref 5.0–8.0)

## 2013-06-09 LAB — COMPREHENSIVE METABOLIC PANEL
AST: 28 U/L (ref 0–37)
Albumin: 4.2 g/dL (ref 3.5–5.2)
BUN: 17 mg/dL (ref 6–23)
Creatinine, Ser: 1.02 mg/dL (ref 0.50–1.35)
Total Protein: 8 g/dL (ref 6.0–8.3)

## 2013-06-09 LAB — RAPID STREP SCREEN (MED CTR MEBANE ONLY): Streptococcus, Group A Screen (Direct): NEGATIVE

## 2013-06-09 LAB — LIPASE, BLOOD: Lipase: 17 U/L (ref 11–59)

## 2013-06-09 MED ORDER — SODIUM CHLORIDE 0.9 % IV BOLUS (SEPSIS)
1000.0000 mL | Freq: Once | INTRAVENOUS | Status: AC
  Administered 2013-06-09: 1000 mL via INTRAVENOUS

## 2013-06-09 MED ORDER — ONDANSETRON HCL 4 MG/2ML IJ SOLN
4.0000 mg | Freq: Once | INTRAMUSCULAR | Status: AC
  Administered 2013-06-09: 4 mg via INTRAVENOUS
  Filled 2013-06-09: qty 2

## 2013-06-09 MED ORDER — IOHEXOL 300 MG/ML  SOLN
25.0000 mL | INTRAMUSCULAR | Status: AC
  Administered 2013-06-09 (×2): 25 mL via ORAL

## 2013-06-09 NOTE — ED Provider Notes (Signed)
History    CSN: [id number] Arrival date & time 06/09/13  7829  First MD Initiated Contact with Patient 06/09/13 1818     Chief Complaint  Patient presents with  . Sore Throat   (Consider location/radiation/quality/duration/timing/severity/associated sxs/prior Treatment) HPI Comments: Patient presents emergency department with chief complaints of nausea, vomiting, and sore throat. He has Down syndrome, and is accompanied by his father, who states that he is concerned about pneumonia. Father states that the patient had pneumonia last year, and it was "very bad." He states that the patient has been spitting up for the past 24 hours, and has not been tolerating oral intake. Denies fevers. Patient states that he is having some abdominal pain and that his throat hurts. Tried anything to alleviate his symptoms.  The history is provided by the patient. No language interpreter was used.   Past Medical History  Diagnosis Date  . Allergy   . Hypothyroidism   . Legg-Calve-Perthes disease   . Down's syndrome   . Rash     hospitalized as a child, etiology latent viral exanthem  . Viral syndrome 09/1995    hospital viral syndrome.  . Chronic serous otitis media   . Scoliosis 6/99    leg length discrepancy.  . Finger fracture 2009    h/o  . Candida esophagitis 04/2012    after ABX for pneumonia  . Complication of anesthesia 08/2012    "hard to wake up after balloon dilatation)  . Complication of anesthesia 1999    "respiratory arrest after hip OR; had to get Narcan" (09/30/2012)  . Heart murmur     "@ birth; resolved" (09/30/2012)  . Pneumonia     multiple hospitalizatons as toddler, but 10 day stay age 29yo  . GERD (gastroesophageal reflux disease)     "maybe" (09/30/2012)  . Headache(784.0)     "recently; maybe from being dehydrated" (09/30/2012)  . Hearing loss, bilateral   . Peristalsis     "almost absent" (09/30/2012)  . Dysphagia 09/30/2012  . OSA on CPAP   . Communication problem      "when he feels bad he just communicates w/thumbs up or down or shakes his head; very limited communication normally" (09/30/2012)   Past Surgical History  Procedure Laterality Date  . Hip arthroplasty  10/1998    left; due to Legg-Calve-Perthe dz and subsequent surgery to remove hardware  . Tympanostomy tube placement  11/1996    bilaterally  . Gum surgery  ~ 2004    graft to gums from roof of mouth.  . Balloon dilation  09/01/2012    Procedure: BALLOON DILATION;  Surgeon: Willis Modena, MD;  Location: [facility];  Service: Endoscopy;  Laterality: N/A;  . Adenoidectomy and myringotomy with tube placement  11/1996    bilaterally  . Esophagogastroduodenoscopy  10/01/2012    Procedure: ESOPHAGOGASTRODUODENOSCOPY (EGD);  Surgeon: Vertell Novak., MD;  Location: [facility];  Service: Endoscopy;  Laterality: N/A;  . Esophagogastroduodenoscopy  10/01/12  . Laparoscopic heller myotomy  11/11/12    Dr.Farrell   Family History  Problem Relation Age of Onset  . Irritable bowel syndrome Mother   . Interstitial cystitis Mother   . Gallbladder disease Father     gallbladder surgery  . Migraines Sister   . Alzheimer's disease Maternal Grandmother     dementia.   History  Substance Use Topics  . Smoking status: Never Smoker   . Smokeless tobacco: Never Used  . Alcohol Use: No  Review of Systems  All other systems reviewed and are negative.    Allergies  Review of patient's allergies indicates no known allergies.  Home Medications   Current Outpatient Rx  Name  Route  Sig  Dispense  Refill  . levothyroxine (SYNTHROID, LEVOTHROID) 88 MCG tablet   Oral   Take 88 mcg by mouth daily.           BP 117/80  Pulse 116  Temp(Src) 98.7 F (37.1 C) (Oral)  Resp 22  Ht 4\' 8"  (1.422 m)  SpO2 96% Physical Exam  Nursing note and vitals reviewed. Constitutional: He is oriented to person, place, and time. He appears well-developed and well-nourished.  HENT:  Head:  Normocephalic and atraumatic.  Nose: Nose normal.  Mouth/Throat: Oropharynx is clear and moist. No oropharyngeal exudate.  White frothy sputum, no signs of abscess, uvula is midline, oropharynx is clear  Eyes: Conjunctivae and EOM are normal. Pupils are equal, round, and reactive to light. Right eye exhibits no discharge. Left eye exhibits no discharge. No scleral icterus.  Neck: Normal range of motion. Neck supple. No JVD present.  Cardiovascular: Normal rate, regular rhythm, normal heart sounds and intact distal pulses.  Exam reveals no gallop and no friction rub.   No murmur heard. Pulmonary/Chest: Effort normal and breath sounds normal. No respiratory distress. He has no wheezes. He has no rales. He exhibits no tenderness.  Abdominal: Soft. Bowel sounds are normal. He exhibits no distension and no mass. There is tenderness. There is no rebound and no guarding.  Musculoskeletal: Normal range of motion. He exhibits no edema and no tenderness.  Neurological: He is alert and oriented to person, place, and time. He has normal reflexes.  CN 3-12 intact  Skin: Skin is warm and dry.  Psychiatric: He has a normal mood and affect. His behavior is normal. Judgment and thought content normal.    ED Course  Procedures (including critical care time) Labs Reviewed  RAPID STREP SCREEN  URINALYSIS, ROUTINE W REFLEX MICROSCOPIC  CBC WITH DIFFERENTIAL  COMPREHENSIVE METABOLIC PANEL  LIPASE, BLOOD   Results for orders placed during the hospital encounter of 06/09/13  RAPID STREP SCREEN      Result Value Range   Streptococcus, Group A Screen (Direct) NEGATIVE  NEGATIVE  URINALYSIS, ROUTINE W REFLEX MICROSCOPIC      Result Value Range   Color, Urine AMBER (*) YELLOW   APPearance CLEAR  CLEAR   Specific Gravity, Urine 1.029  1.005 - 1.030   pH 6.5  5.0 - 8.0   Glucose, UA NEGATIVE  NEGATIVE mg/dL   Hgb urine dipstick NEGATIVE  NEGATIVE   Bilirubin Urine NEGATIVE  NEGATIVE   Ketones, ur 15 (*)  NEGATIVE mg/dL   Protein, ur NEGATIVE  NEGATIVE mg/dL   Urobilinogen, UA 1.0  0.0 - 1.0 mg/dL   Nitrite NEGATIVE  NEGATIVE   Leukocytes, UA NEGATIVE  NEGATIVE  CBC WITH DIFFERENTIAL      Result Value Range   WBC 6.0  4.0 - 10.5 K/uL   RBC 5.28  4.22 - 5.81 MIL/uL   Hemoglobin 17.4 (*) 13.0 - 17.0 g/dL   HCT 16.1  09.6 - 04.5 %   MCV 90.5  78.0 - 100.0 fL   MCH 33.0  26.0 - 34.0 pg   MCHC 36.4 (*) 30.0 - 36.0 g/dL   RDW 40.9  81.1 - 91.4 %   Platelets 126 (*) 150 - 400 K/uL   Neutrophils Relative % 75  43 -  77 %   Neutro Abs 4.4  1.7 - 7.7 K/uL   Lymphocytes Relative 12  12 - 46 %   Lymphs Abs 0.7  0.7 - 4.0 K/uL   Monocytes Relative 11  3 - 12 %   Monocytes Absolute 0.7  0.1 - 1.0 K/uL   Eosinophils Relative 1  0 - 5 %   Eosinophils Absolute 0.0  0.0 - 0.7 K/uL   Basophils Relative 2 (*) 0 - 1 %   Basophils Absolute 0.1  0.0 - 0.1 K/uL  COMPREHENSIVE METABOLIC PANEL      Result Value Range   Sodium 138  135 - 145 mEq/L   Potassium 4.1  3.5 - 5.1 mEq/L   Chloride 99  96 - 112 mEq/L   CO2 30  19 - 32 mEq/L   Glucose, Bld 99  70 - 99 mg/dL   BUN 17  6 - 23 mg/dL   Creatinine, Ser 9.81  0.50 - 1.35 mg/dL   Calcium 9.8  8.4 - 19.1 mg/dL   Total Protein 8.0  6.0 - 8.3 g/dL   Albumin 4.2  3.5 - 5.2 g/dL   AST 28  0 - 37 U/L   ALT 27  0 - 53 U/L   Alkaline Phosphatase 80  39 - 117 U/L   Total Bilirubin 0.9  0.3 - 1.2 mg/dL   GFR calc non Af Amer >90  >90 mL/min   GFR calc Af Amer >90  >90 mL/min  LIPASE, BLOOD      Result Value Range   Lipase 17  11 - 59 U/L   Dg Chest 2 View  06/09/2013   *RADIOLOGY REPORT*  Clinical Data: Down syndrome patient with shortness of breath.  CHEST - 2 VIEW  Comparison: Two-view chest x-ray 05/02/2012 and CT chest of that same date.  Findings: Cardiomediastinal silhouette unremarkable, unchanged. Lungs clear.  Bronchovascular markings normal.  Pulmonary vascularity normal.  No pneumothorax.  No pleural effusions. Visualized bony thorax intact.   No significant interval change.  IMPRESSION: No acute cardiopulmonary disease.  Stable examination.   Original Report Authenticated By: Hulan Saas, M.D.     No results found. No diagnosis found.  MDM  Patient with Down's Syndrome.  Father reports sore throat, nausea, and vomiting.  Will check labs, and re-evaluate.  9:42 PM Patient seen by and discussed with Dr. Dan Humphreys, who recommends CT abd/pelv.  12:52 AM CT still pending.  Sign out given to P. Dammen, PA-C, who will continue care at this time.  Plan: Dispo pending CT, if negative, treat with Diflucan for oral candida and follow-up with PCP tomorrow.  Roxy Horseman, PA-C 06/10/13 0107

## 2013-06-09 NOTE — ED Notes (Addendum)
Pt from home with father. Per father pt has had similar symptoms with esophageal sphincter narrowing that required surgical intervention on 11/11/2013 at [facility]. Pt c/o sore/burning sensation in throat, pain on swallowing (no drooling), nausea, and is spitting up white frothy saliva. Pt is nonverbal, able to answer yes/no questions, per Father this is baseline

## 2013-06-10 ENCOUNTER — Encounter (HOSPITAL_COMMUNITY): Payer: Self-pay | Admitting: Radiology

## 2013-06-10 ENCOUNTER — Ambulatory Visit: Payer: Self-pay | Admitting: Family Medicine

## 2013-06-10 ENCOUNTER — Emergency Department (HOSPITAL_COMMUNITY): Payer: BC Managed Care – PPO

## 2013-06-10 MED ORDER — IOHEXOL 300 MG/ML  SOLN
80.0000 mL | Freq: Once | INTRAMUSCULAR | Status: AC | PRN
  Administered 2013-06-10: 80 mL via INTRAVENOUS

## 2013-06-10 MED ORDER — FLUCONAZOLE 200 MG PO TABS: 100.0000 mg | ORAL_TABLET | Freq: Every day | ORAL | Status: AC

## 2013-06-10 NOTE — ED Provider Notes (Signed)
Medical screening examination/treatment/procedure(s) were performed by non-physician practitioner and as supervising physician I was immediately available for consultation/collaboration.   Brandt Loosen, MD 06/10/13 [phone]

## 2013-06-10 NOTE — ED Provider Notes (Signed)
Ronald Le  1:00AM patient discussed in sign out. Patient with history of Down syndrome presenting with complaints of sore throat as well as abdominal pains and discomfort. Patient with some discomforts in the abdomen on exam however given difficulties with history CT scan has been ordered to rule out any acute process. Patient was found to have oral thrush likely the cause of his mouth and throat discomforts. If CT unremarkable plan to treat thrush with Diflucan.   CT unremarkable for any acute process. At this time patient is resting currently in bed does not appear in any acute distress. Findings were discussed with the patient and father. At this time we will plan to discharge home with prescriptions for Diflucan. They will followup with PCP.  Angus Seller, PA-C 06/10/13 [phone]

## 2013-06-11 LAB — CULTURE, GROUP A STREP

## 2013-06-12 ENCOUNTER — Telehealth (HOSPITAL_COMMUNITY): Payer: Self-pay | Admitting: Emergency Medicine

## 2013-06-12 NOTE — Progress Notes (Signed)
ED Antimicrobial Stewardship Positive Culture Follow Up   Ronald Le is an 22 y.o. male who presented to [facility] on 06/09/2013 with a chief complaint of sore/buring throat, pain on swallowing  Chief Complaint  Patient presents with  . Sore Throat    Recent Results (from the past 720 hour(s))  RAPID STREP SCREEN     Status: None   Collection Time    06/09/13  7:42 PM      Result Value Range Status   Streptococcus, Group A Screen (Direct) NEGATIVE  NEGATIVE Final   Comment: (NOTE)     A Rapid Antigen test may result negative if the antigen level in the     sample is below the detection level of this test. The FDA has not     cleared this test as a stand-alone test therefore the rapid antigen     negative result has reflexed to a Group A Strep culture.  CULTURE, GROUP A STREP     Status: None   Collection Time    06/09/13  7:42 PM      Result Value Range Status   Specimen Description THROAT   Final   Special Requests NONE   Final   Culture STREPTOCOCCUS,BETA HEMOLYIC NOT GROUP A   Final   Report Status 06/11/2013 FINAL   Final    [x]  No additional treatment indicated  21 YOM with Down Syndrome who was found on exam to have oral thrush which is the likely culprit for his sore throat. The throat swab also grew out non-group A Strept -- discussed with ED-PA, will not treatment  New antibiotic prescription: Continue Fluconazole as prescribed, no new antibiotics needed at this time  ED Provider: Emilia Beck, PA-C  [name] 06/12/2013, 9:57 AM Infectious Diseases Pharmacist Phone# [phone]

## 2013-06-12 NOTE — ED Notes (Signed)
Post ED Visit - Positive Culture Follow-up  Culture report reviewed by antimicrobial stewardship pharmacist: []  Wes Dulaney, Pharm.D., BCPS []  Celedonio Miyamoto, Pharm.D., BCPS [x]  Georgina Pillion, Pharm.D., BCPS []  [place], [place].D., BCPS, AAHIVP []  Estella Husk, Pharm.D., BCPS, AAHIVP  Positive Strep culture Per [facility], would recommend not treating Strep-->continue fluconazole as prescribed and no further patient follow-up is required at this time.  Kylie A Holland 06/12/2013, 11:52 AM

## 2013-06-26 NOTE — ED Provider Notes (Signed)
Medical screening examination/treatment/procedure(s) were conducted as a shared visit with non-physician practitioner(s) and myself.  I personally evaluated the patient during the encounter   I fully examined this patient and discussed his presentation with his father. The patient has some history of candidal esophagitis, and presents here today with complaint of abdominal pain. The history is significantly limited due to his Down syndrome, however. Due to some tenderness to palpation elicited on exam as well as limited history, will obtain CT scan to rule out any significant etiology for his pain. If this is negative we will start p.o. Diflucan and recommend followup with PCP.  Ashby Dawes, MD 06/26/13 [phone]

## 2013-09-08 ENCOUNTER — Other Ambulatory Visit (INDEPENDENT_AMBULATORY_CARE_PROVIDER_SITE_OTHER): Payer: BC Managed Care – PPO

## 2013-09-08 DIAGNOSIS — Z23 Encounter for immunization: Secondary | ICD-10-CM

## 2013-09-28 ENCOUNTER — Encounter: Payer: Self-pay | Admitting: *Deleted

## 2013-10-06 ENCOUNTER — Ambulatory Visit (INDEPENDENT_AMBULATORY_CARE_PROVIDER_SITE_OTHER): Payer: BC Managed Care – PPO | Admitting: Family Medicine

## 2013-10-06 ENCOUNTER — Encounter: Payer: Self-pay | Admitting: Family Medicine

## 2013-10-06 VITALS — BP 98/60 | HR 80 | Ht <= 58 in | Wt 111.0 lb

## 2013-10-06 DIAGNOSIS — B351 Tinea unguium: Secondary | ICD-10-CM

## 2013-10-06 DIAGNOSIS — Q909 Down syndrome, unspecified: Secondary | ICD-10-CM

## 2013-10-06 DIAGNOSIS — K5900 Constipation, unspecified: Secondary | ICD-10-CM

## 2013-10-06 DIAGNOSIS — K219 Gastro-esophageal reflux disease without esophagitis: Secondary | ICD-10-CM

## 2013-10-06 DIAGNOSIS — E039 Hypothyroidism, unspecified: Secondary | ICD-10-CM

## 2013-10-06 DIAGNOSIS — Z0000 Encounter for general adult medical examination without abnormal findings: Secondary | ICD-10-CM

## 2013-10-06 LAB — POCT URINALYSIS DIPSTICK
Bilirubin, UA: NEGATIVE
Blood, UA: NEGATIVE
Ketones, UA: NEGATIVE
Spec Grav, UA: 1.02
pH, UA: 5

## 2013-10-06 NOTE — Patient Instructions (Signed)
HEALTH MAINTENANCE RECOMMENDATIONS:  It is recommended that you get at least 30 minutes of aerobic exercise at least 5 days/week (for weight loss, you may need as much as 60-90 minutes). This can be any activity that gets your heart rate up. This can be divided in 10-15 minute intervals if needed, but try and build up your endurance at least once a week.  Weight bearing exercise is also recommended twice weekly.  Eat a healthy diet with lots of vegetables, fruits and fiber.  "Colorful" foods have a lot of vitamins (ie green vegetables, tomatoes, red peppers, etc).  Limit sweet tea, regular sodas and alcoholic beverages, all of which has a lot of calories and sugar. Drink a lot of water.  Sunscreen of at least SPF 30 should be used on all sun-exposed parts of the skin when outside between the hours of 10 am and 4 pm (not just when at beach or pool, but even with exercise, golf, tennis, and yard work!)  Use a sunscreen that says "broad spectrum" so it covers both UVA and UVB rays, and make sure to reapply every 1-2 hours.  Remember to change the batteries in your smoke detectors when changing your clock times in the spring and fall.  Use your seat belt every time you are in a car, and please drive safely and not be distracted with cell phones and texting while driving.  Constipation, Adult Constipation is when a person has fewer than 3 bowel movements a week; has difficulty having a bowel movement; or has stools that are dry, hard, or larger than normal. As people grow older, constipation is more common. If you try to fix constipation with medicines that make you have a bowel movement (laxatives), the problem may get worse. Long-term laxative use may cause the muscles of the colon to become weak. A low-fiber diet, not taking in enough fluids, and taking certain medicines may make constipation worse. CAUSES   Certain medicines, such as antidepressants, pain medicine, iron supplements, antacids, and  water pills.   Certain diseases, such as diabetes, irritable bowel syndrome (IBS), thyroid disease, or depression.   Not drinking enough water.   Not eating enough fiber-rich foods.   Stress or travel.  Lack of physical activity or exercise.  Not going to the restroom when there is the urge to have a bowel movement.  Ignoring the urge to have a bowel movement.  Using laxatives too much. SYMPTOMS   Having fewer than 3 bowel movements a week.   Straining to have a bowel movement.   Having hard, dry, or larger than normal stools.   Feeling full or bloated.   Pain in the lower abdomen.  Not feeling relief after having a bowel movement. DIAGNOSIS  Your caregiver will take a medical history and perform a physical exam. Further testing may be done for severe constipation. Some tests may include:   A barium enema X-ray to examine your rectum, colon, and sometimes, your small intestine.  A sigmoidoscopy to examine your lower colon.  A colonoscopy to examine your entire colon. TREATMENT  Treatment will depend on the severity of your constipation and what is causing it. Some dietary treatments include drinking more fluids and eating more fiber-rich foods. Lifestyle treatments may include regular exercise. If these diet and lifestyle recommendations do not help, your caregiver may recommend taking over-the-counter laxative medicines to help you have bowel movements. Prescription medicines may be prescribed if over-the-counter medicines do not work.  HOME CARE INSTRUCTIONS  Increase dietary fiber in your diet, such as fruits, vegetables, whole grains, and beans. Limit high-fat and processed sugars in your diet, such as [place] fries, hamburgers, cookies, candies, and soda.   A fiber supplement may be added to your diet if you cannot get enough fiber from foods.   Drink enough fluids to keep your urine clear or pale yellow.   Exercise regularly or as directed by your  caregiver.   Go to the restroom when you have the urge to go. Do not hold it.  Only take medicines as directed by your caregiver. Do not take other medicines for constipation without talking to your caregiver first. SEEK IMMEDIATE MEDICAL CARE IF:   You have bright red blood in your stool.   Your constipation lasts for more than 4 days or gets worse.   You have abdominal or rectal pain.   You have thin, pencil-like stools.  You have unexplained weight loss. MAKE SURE YOU:   Understand these instructions.  Will watch your condition.  Will get help right away if you are not doing well or get worse. Document Released: 08/08/2004 Document Revised: 02/02/2012 Document Reviewed: 10/14/2011 [facility] Patient Information 2014 [place], [place].

## 2013-10-06 NOTE — Progress Notes (Signed)
Chief Complaint  Patient presents with  . Annual Exam    fasting annual exam. Could not do eye exam, did not know alphabet or pics. Did want you to look at his toenails, thinks fungus is back.     Ronald Le is a 22 y.o. male who presents for a complete physical.  He has the following concerns:  Having reflux since his surgery, complaining of his "tummy hurting".  He was given Dexilant samples about a week ago.  He is on a reflux diet. +constipation intermittently, recently bothering him.  Using miralax 1-2x/week. Concerned that his fungal nail infection has recurred.  Denies pain or ingrowing toenails.  Helps his mother at home with his baby sister (adopted, Kara Mead ,[place]).  During the day, other than helping, he watches TV, plays video games, plays music, draws.  Father reports that he stays up too late, and so is sometimes tired during the day.  He has OSA, and reports that Lonney doesn't keep the CPAP machine on for very long.  Doesn't get much exercise. Wears glasses--due for eye exam in December, goes every 2 years. Regular dental exams  Hypothyroidism--this is managed by Soledad Gerlach recent visit and labs; med dose was not changed.  He received pneumonia vaccine through their office.   Immunization History  Administered Date(s) Administered  . DTaP 01/31/1992, 04/10/1992, 06/19/1992, 04/16/1993, 09/27/1996  . Hepatitis A 02/05/2009, 03/19/2011  . Hepatitis B 04/10/1992, 06/19/1992, 04/16/1993  . HiB (PRP-OMP) 01/31/1992, 04/10/1992, 06/19/1992, 04/16/1993  . IPV 01/31/1992, 04/10/1992, 04/16/1993, 09/27/1996  . Influenza Split 11/12/2006, 11/26/2007, 09/28/2008, 09/02/2012  . Influenza,inj,Quad PF,36+ Mos 09/08/2013  . MMR 10/01/1993, 09/27/1996  . Meningococcal Conjugate 02/05/2009  . Pneumococcal Polysaccharide 09/15/2013  . Td 11/12/2006  . Tdap 03/19/2011  . Varicella 09/25/1998, 11/26/2007   Past Medical History  Diagnosis Date  . Allergy   .  Hypothyroidism   . Legg-Calve-Perthes disease   . Down's syndrome   . Rash     hospitalized as a child, etiology latent viral exanthem  . Viral syndrome 09/1995    hospital viral syndrome.  . Chronic serous otitis media   . Scoliosis 6/99    leg length discrepancy.  . Finger fracture 2009    h/o  . Candida esophagitis 04/2012    after ABX for pneumonia  . Complication of anesthesia 08/2012    "hard to wake up after balloon dilatation)  . Complication of anesthesia 1999    "respiratory arrest after hip OR; had to get Narcan" (09/30/2012)  . Heart murmur     "@ birth; resolved" (09/30/2012)  . Pneumonia     multiple hospitalizatons as toddler, but 10 day stay age 64yo  . GERD (gastroesophageal reflux disease)     "maybe" (09/30/2012)  . Headache(784.0)     "recently; maybe from being dehydrated" (09/30/2012)  . Hearing loss, bilateral   . Peristalsis     "almost absent" (09/30/2012)  . Dysphagia 09/30/2012  . OSA on CPAP     He doesn't keep it on very long at night  . Communication problem     "when he feels bad he just communicates w/thumbs up or down or shakes his head; very limited communication normally" (09/30/2012)    Past Surgical History  Procedure Laterality Date  . Hip arthroplasty  10/1998    left; due to Legg-Calve-Perthe dz and subsequent surgery to remove hardware  . Tympanostomy tube placement  11/1996    bilaterally  . Gum surgery  ~ 2004  graft to gums from roof of mouth.  . Balloon dilation  09/01/2012    Procedure: BALLOON DILATION;  Surgeon: Willis Modena, MD;  Location: [facility];  Service: Endoscopy;  Laterality: N/A;  . Adenoidectomy and myringotomy with tube placement  11/1996    bilaterally  . Esophagogastroduodenoscopy  10/01/2012    Procedure: ESOPHAGOGASTRODUODENOSCOPY (EGD);  Surgeon: Vertell Novak., MD;  Location: [facility] ENDOSCOPY;  Service: Endoscopy;  Laterality: N/A;  . Esophagogastroduodenoscopy  10/01/12  . Laparoscopic heller myotomy   11/11/12    Dr.Farrell    History   Social History  . Marital Status: Single    Spouse Name: N/A    Number of Children: N/A  . Years of Education: N/A   Occupational History  . Not on file.   Social History Main Topics  . Smoking status: Never Smoker   . Smokeless tobacco: Never Used  . Alcohol Use: No  . Drug Use: No  . Sexual Activity: Not on file   Other Topics Concern  . Not on file   Social History Narrative  . No narrative on file    Family History  Problem Relation Age of Onset  . Irritable bowel syndrome Mother   . Interstitial cystitis Mother   . Gallbladder disease Father     gallbladder surgery  . Migraines Sister   . Alzheimer's disease Maternal Grandmother     dementia.    Current outpatient prescriptions:cetirizine (ZYRTEC) 10 MG tablet, Take 10 mg by mouth daily., Disp: , Rfl: ;  dexlansoprazole (DEXILANT) 60 MG capsule, Take 60 mg by mouth daily., Disp: , Rfl: ;  levothyroxine (SYNTHROID, LEVOTHROID) 88 MCG tablet, Take 88 mcg by mouth daily. , Disp: , Rfl: ;  polyethylene glycol (MIRALAX / GLYCOLAX) packet, Take 17 g by mouth daily as needed., Disp: , Rfl:   No Known Allergies  ROS: no weight changes (lost weight related to GI problems over a year ago, and has been steady since then).  Denies fevers, URI symptoms, cough, chest pain, palpitations, nausea, vomiting.  +heartburn and abdominal pain, +constipation. No blood in stools, urinary complaints, joint pain or other pains.  Denies bleeding, bruising, rashes, depression, insomnia.  Denies dysphagia.  Denies shortness of breath or other complaints.  PHYSICAL EXAM:  BP 98/60  Pulse 80  Ht 4\' 9"  (1.448 m)  Wt 111 lb (50.349 kg)  BMI 24.01 kg/m2  General Appearance:    Alert, cooperative, no distress, appears stated age with typical Downs syndrome features.  He is very pleasant, answering in one word, yes or no answers.  He follows commands well.  Head:    Normocephalic, without obvious  abnormality, atraumatic  Eyes:    PERRL, conjunctiva/corneas clear, EOM's intact, fundi    Benign.  Wears glasses  Ears:    Normal TM's and external ear canals  Nose:   Nares normal, mucosa normal, no drainage or sinus   tenderness  Throat:   Lips, mucosa, and tongue normal; teeth and gums normal  Neck:   Supple, no lymphadenopathy;  thyroid:  no   enlargement/tenderness/nodules; no carotid   bruit or JVD  Back:    Spine nontender, no curvature, ROM normal, no CVA     tenderness  Lungs:     Clear to auscultation bilaterally without wheezes, rales or     ronchi; respirations unlabored  Chest Wall:    No tenderness or deformity   Heart:    Regular rate and rhythm, S1 and S2 normal, no  murmur, rub   or gallop  Breast Exam:    No chest wall tenderness, masses or gynecomastia  Abdomen:     Soft, nondistended, normoactive bowel sounds, no masses, no hepatosplenomegaly.  +mild tenderness in epigastrium and suprapubically.  WHSS  Genitalia:    Normal male external genitalia without lesions.  Testicles without masses.  No inguinal hernias.  Rectal:   Deferred due to age <40 and lack of symptoms  Extremities:   No clubbing, cyanosis or edema. Thickened, discolored toenails--R 3rd and 4th, left first, 3rd and fourth toes.  No ingrowing, redness, swelling, nontender  Pulses:   2+ and symmetric all extremities  Skin:   Skin color, texture, turgor normal, no rashes or lesions  Lymph nodes:   Cervical, supraclavicular, and axillary nodes normal  Neurologic:   CNII-XII intact, normal strength, sensation and gait; reflexes 2+ and symmetric throughout          Psych:   Normal mood, affect, hygiene and grooming.     ASSESSMENT/PLAN:  Routine general medical examination at a health care facility - Plan: POCT Urinalysis Dipstick, Lipid panel, Glucose, random  Down's syndrome  Hypothyroidism - controlled, per father's report of recent normal TSH  Onychomycosis - asymptomatic.  hold off on treatment at  this time  Unspecified constipation - discussed importance of adequate fluid intake, high fiber diet, and regular exercise.  GERD (gastroesophageal reflux disease) - continue dexilant. reviewed proper diet.  Recommended at least 30 minutes of aerobic activity at least 5 days/week; proper sunscreen use reviewed; healthy diet and alcohol recommendations (less than or equal to 2 drinks/day) reviewed; regular seatbelt use; changing batteries in smoke detectors. Self-testicular exams. Immunization recommendations discussed--UTD.  Healthy diet reviewed, including calcium, vitamin D and high fiber diet, fruits and vegetables.  Discussed proper use of Miralax

## 2013-10-07 ENCOUNTER — Encounter: Payer: Self-pay | Admitting: Family Medicine

## 2013-10-07 DIAGNOSIS — K5900 Constipation, unspecified: Secondary | ICD-10-CM | POA: Insufficient documentation

## 2013-10-07 LAB — LIPID PANEL
HDL: 47 mg/dL (ref >39)
LDL Cholesterol: 87 mg/dL (ref 0–99)
Total CHOL/HDL Ratio: 3.4 Ratio
Triglycerides: 130 mg/dL (ref <150)
VLDL: 26 mg/dL (ref 0–40)

## 2013-10-07 LAB — GLUCOSE, RANDOM: Glucose, Bld: 95 mg/dL (ref 70–99)

## 2013-10-25 ENCOUNTER — Other Ambulatory Visit: Payer: BC Managed Care – PPO

## 2013-10-25 ENCOUNTER — Other Ambulatory Visit: Payer: Self-pay | Admitting: Gastroenterology

## 2013-10-25 DIAGNOSIS — R0789 Other chest pain: Secondary | ICD-10-CM

## 2013-10-27 ENCOUNTER — Ambulatory Visit
Admission: RE | Admit: 2013-10-27 | Discharge: 2013-10-27 | Disposition: A | Discharge status: Home / Self Care | Payer: BC Managed Care – PPO | Source: Ambulatory Visit | Attending: Gastroenterology | Admitting: Gastroenterology

## 2013-10-27 DIAGNOSIS — R0789 Other chest pain: Secondary | ICD-10-CM

## 2013-11-16 HISTORY — PX: OTHER SURGICAL HISTORY: SHX169

## 2013-11-28 ENCOUNTER — Encounter (HOSPITAL_COMMUNITY): Payer: Self-pay | Admitting: Pharmacy Technician

## 2013-12-06 ENCOUNTER — Encounter (HOSPITAL_COMMUNITY): Payer: Self-pay | Admitting: *Deleted

## 2013-12-12 ENCOUNTER — Telehealth: Payer: Self-pay | Admitting: Family Medicine

## 2013-12-12 NOTE — Telephone Encounter (Signed)
Ok to refer to optometrist.  I am happy to evaluate his scalp--schedule OV

## 2013-12-19 ENCOUNTER — Other Ambulatory Visit: Payer: Self-pay | Admitting: Gastroenterology

## 2013-12-19 NOTE — Addendum Note (Signed)
Addended by: Willis ModenaUTLAW, Thalia Turkington on: 12/19/2013 01:34 PM   Modules accepted: Orders

## 2013-12-21 ENCOUNTER — Encounter (HOSPITAL_COMMUNITY): Payer: BC Managed Care – PPO | Admitting: Certified Registered"

## 2013-12-21 ENCOUNTER — Ambulatory Visit (HOSPITAL_COMMUNITY): Payer: BC Managed Care – PPO | Admitting: Certified Registered"

## 2013-12-21 ENCOUNTER — Ambulatory Visit (HOSPITAL_COMMUNITY)
Admission: RE | Admit: 2013-12-21 | Discharge: 2013-12-21 | Disposition: A | Discharge status: Home / Self Care | Payer: BC Managed Care – PPO | Source: Ambulatory Visit | Attending: Gastroenterology | Admitting: Gastroenterology

## 2013-12-21 ENCOUNTER — Encounter (HOSPITAL_COMMUNITY): Payer: Self-pay

## 2013-12-21 ENCOUNTER — Encounter (HOSPITAL_COMMUNITY)
Admission: RE | Disposition: A | Discharge status: Home / Self Care | Payer: Self-pay | Source: Ambulatory Visit | Attending: Gastroenterology

## 2013-12-21 DIAGNOSIS — K2990 Gastroduodenitis, unspecified, without bleeding: Secondary | ICD-10-CM

## 2013-12-21 DIAGNOSIS — K219 Gastro-esophageal reflux disease without esophagitis: Secondary | ICD-10-CM | POA: Insufficient documentation

## 2013-12-21 DIAGNOSIS — K2970 Gastritis, unspecified, without bleeding: Secondary | ICD-10-CM | POA: Insufficient documentation

## 2013-12-21 DIAGNOSIS — Q909 Down syndrome, unspecified: Secondary | ICD-10-CM | POA: Insufficient documentation

## 2013-12-21 DIAGNOSIS — G4730 Sleep apnea, unspecified: Secondary | ICD-10-CM | POA: Insufficient documentation

## 2013-12-21 DIAGNOSIS — R079 Chest pain, unspecified: Secondary | ICD-10-CM | POA: Insufficient documentation

## 2013-12-21 HISTORY — PX: ESOPHAGOGASTRODUODENOSCOPY (EGD) WITH PROPOFOL: SHX5813

## 2013-12-21 SURGERY — ESOPHAGOGASTRODUODENOSCOPY (EGD) WITH PROPOFOL
Anesthesia: Monitor Anesthesia Care

## 2013-12-21 MED ORDER — LIDOCAINE HCL (PF) 2 % IJ SOLN
INTRAMUSCULAR | Status: DC | PRN
  Administered 2013-12-21: 20 mg via INTRADERMAL

## 2013-12-21 MED ORDER — LIDOCAINE HCL (CARDIAC) 20 MG/ML IV SOLN
INTRAVENOUS | Status: AC
  Filled 2013-12-21: qty 5

## 2013-12-21 MED ORDER — BISMUTH SUBSALICYLATE 262 MG/15ML PO SUSP: 30.0000 mL | Freq: Two times a day (BID) | ORAL | Status: DC

## 2013-12-21 MED ORDER — SODIUM CHLORIDE 0.9 % IV SOLN: INTRAVENOUS | Status: DC

## 2013-12-21 MED ORDER — LACTATED RINGERS IV SOLN
INTRAVENOUS | Status: DC
  Administered 2013-12-21: 1000 mL via INTRAVENOUS

## 2013-12-21 MED ORDER — PROPOFOL 10 MG/ML IV BOLUS
INTRAVENOUS | Status: DC | PRN
  Administered 2013-12-21 (×3): 50 mg via INTRAVENOUS

## 2013-12-21 MED ORDER — PROPOFOL 10 MG/ML IV BOLUS
INTRAVENOUS | Status: AC
  Filled 2013-12-21: qty 20

## 2013-12-21 SURGICAL SUPPLY — 15 items

## 2013-12-21 NOTE — Anesthesia Postprocedure Evaluation (Signed)
Anesthesia Post Note  Patient: Ronald Le  Procedure(s) Performed: Procedure(s) (LRB): ESOPHAGOGASTRODUODENOSCOPY (EGD) WITH PROPOFOL (N/A)  Anesthesia type: MAC  Patient location: PACU  Post pain: Pain level controlled  Post assessment: Post-op Vital signs reviewed  Last Vitals: BP 108/73  Temp(Src) 36.4 C (Oral)  Resp 14  SpO2 100%  Post vital signs: Reviewed  Level of consciousness: awake  Complications: No apparent anesthesia complications

## 2013-12-21 NOTE — Transfer of Care (Signed)
Immediate Anesthesia Transfer of Care Note  Patient: Ronald Le  Procedure(s) Performed: Procedure(s) (LRB): ESOPHAGOGASTRODUODENOSCOPY (EGD) WITH PROPOFOL (N/A)  Patient Location: PACU  Anesthesia Type: MAC  Level of Consciousness: sedated, patient cooperative and responds to stimulation  Airway & Oxygen Therapy: Patient Spontanous Breathing and Patient connected to face mask oxgen  Post-op Assessment: Report given to PACU RN and Post -op Vital signs reviewed and stable  Post vital signs: Reviewed and stable  Complications: No apparent anesthesia complications

## 2013-12-21 NOTE — Anesthesia Preprocedure Evaluation (Signed)
Anesthesia Evaluation  Patient identified by MRN, date of birth, ID band Patient awake    Reviewed: Allergy & Precautions, H&P , NPO status , Patient's Chart, lab work & pertinent test results  Airway Mallampati: II TM Distance: >3 FB Neck ROM: Full    Dental no notable dental hx. (+) Dental Advisory Given   Pulmonary sleep apnea , pneumonia -,  breath sounds clear to auscultation  Pulmonary exam normal       Cardiovascular negative cardio ROS  Rhythm:Regular Rate:Normal     Neuro/Psych Down's Syndrome  negative neurological ROS  negative psych ROS   GI/Hepatic Neg liver ROS, GERD-  ,  Endo/Other  negative endocrine ROSHypothyroidism   Renal/GU negative Renal ROS     Musculoskeletal negative musculoskeletal ROS (+)   Abdominal   Peds  Hematology negative hematology ROS (+)   Anesthesia Other Findings   Reproductive/Obstetrics negative OB ROS                           Anesthesia Physical  Anesthesia Plan  ASA: III  Anesthesia Plan: MAC   Post-op Pain Management:    Induction:   Airway Management Planned: Simple Face Mask and Nasal Cannula  Additional Equipment:   Intra-op Plan:   Post-operative Plan:   Informed Consent: I have reviewed the patients History and Physical, chart, labs and discussed the procedure including the risks, benefits and alternatives for the proposed anesthesia with the patient or authorized representative who has indicated his/her understanding and acceptance.   Dental advisory given  Plan Discussed with: CRNA  Anesthesia Plan Comments:         Anesthesia Quick Evaluation

## 2013-12-21 NOTE — H&P (Signed)
Patient interval history reviewed.  Patient examined again.  There has been no change from documented H/P dated 12/14/13 (scanned into chart from our office) except as documented above.  Assessment:  1.  Chest pain.  Refractory to escalating PPI therapy. 2.  History of achalasia s/p Heller myotomy December 2013.  Plan:  1.  Endoscopy. 2.  Risks (bleeding, infection, bowel perforation that could require surgery, sedation-related changes in cardiopulmonary systems), benefits (identification and possible treatment of source of symptoms, exclusion of certain causes of symptoms), and alternatives (watchful waiting, radiographic imaging studies, empiric medical treatment) of upper endoscopy (EGD) were explained to patient/family in detail and patient wishes to proceed.

## 2013-12-21 NOTE — Op Note (Signed)
[facility] [address] [place], [id number]   ENDOSCOPY PROCEDURE REPORT  PATIENT: Ronald Le, [name]  MR#: [id number] BIRTHDATE: May 31, 1991 , 22  yrs. old GENDER: Male ENDOSCOPIST: Willis ModenaWilliam Adalid Beckmann, MD REFERRED BY:  Joselyn ArrowEve Knapp, M.D. PROCEDURE DATE:  12/21/2013 PROCEDURE:  EGD, diagnostic ASA CLASS:     Class III INDICATIONS:  chest pain, history of achalasia post Heller Myotomy 10/2012. MEDICATIONS: MAC sedation, administered by CRNA TOPICAL ANESTHETIC: Cetacaine Spray  DESCRIPTION OF PROCEDURE: After the risks benefits and alternatives of the procedure were thoroughly explained, informed consent was obtained.  The Pentax Gastroscope [id number] endoscope was introduced through the mouth and advanced to the second portion of the duodenum. Without limitations.  The instrument was slowly withdrawn as the mucosa was fully examined.     Findings:  Diminished peristalsis with some mucoid debris (easily lavaged free) in esophagus, compatible with achalasia.  Patient GE junction without evidence of stricture or stenosis.  Normal esophagus otherwise, specifically no evidence of esophagitis or ulceration.  Mild gastritis of body, appearance in cardia typical of Dor fundoplication, otherwise normal stomach.  Normal pylorus. Normal duodenum to the second portion.     The scope was then withdrawn from the patient and the procedure completed.  ENDOSCOPIC IMPRESSION:     As above.  No mucosal abnormality of esophagus.  Suspect chest pain is due to esophageal reflex, likely in turn related to [facility].  RECOMMENDATIONS:     1.  Watch for potential complications of procedure. 2.  Patient has not improved with dexlansoprazole; will accordingly try liquid bismuth subsalicylate to see if this helps; if not, consider trial of carafate; if none of these works, might consider a different PPI. 3.  Follow-up with [facility] in 6-8 weeks, based on response to  above treatments.  eSigned:  Willis ModenaWilliam Dashanique Brownstein, MD 12/21/2013 9:00 AM   CC:

## 2013-12-21 NOTE — Discharge Instructions (Signed)
Endoscopy Care After Please read the instructions outlined below and refer to this sheet in the next few weeks. These discharge instructions provide you with general information on caring for yourself after you leave the hospital. Your doctor may also give you specific instructions. While your treatment has been planned according to the most current medical practices available, unavoidable complications occasionally occur. If you have any problems or questions after discharge, please call Dr. Dulce Sellarutlaw [facility]) at [phone].  HOME CARE INSTRUCTIONS Activity  You may resume your regular activity but move at a slower pace for the next 24 hours.   Take frequent rest periods for the next 24 hours.   Walking will help expel (get rid of) the air and reduce the bloated feeling in your abdomen.   No driving for 24 hours (because of the anesthesia (medicine) used during the test).   You may shower.   Do not sign any important legal documents or operate any machinery for 24 hours (because of the anesthesia used during the test).  Nutrition  Drink plenty of fluids.   Soft diet today, advance slowly tomorrow as tolerated.  Avoid alcoholic beverages for 24 hours or as instructed by your caregiver.  Medications You may resume your normal medications unless your caregiver tells you otherwise. What you can expect today  You may experience abdominal discomfort such as a feeling of fullness or "gas" pains.   You may experience a sore throat for 2 to 3 days. This is normal. Gargling with salt water may help this.    SEEK IMMEDIATE MEDICAL CARE IF:  You have excessive nausea (feeling sick to your stomach) and/or vomiting.   You have severe abdominal pain and distention (swelling).   You have trouble swallowing.   You have a temperature over 100 F (37.8 C).   You have rectal bleeding or vomiting of blood.  Document Released: 06/24/2004 Document Revised: 07/23/2011 Document  Reviewed: 01/05/2008 [facility] Patient Information 2012 [place].Monitored Anesthesia Care  Monitored anesthesia care is an anesthesia service for a medical procedure. Anesthesia is the loss of the ability to feel pain. It is produced by medications called anesthetics. It may affect a small area of your body (local anesthesia), a large area of your body (regional anesthesia), or your entire body (general anesthesia). The need for monitored anesthesia care depends your procedure, your condition, and the potential need for regional or general anesthesia. It is often provided during procedures where:   General anesthesia may be needed if there are complications. This is because you need special care when you are under general anesthesia.   You will be under local or regional anesthesia. This is so that you are able to have higher levels of anesthesia if needed.   You will receive calming medications (sedatives). This is especially the case if sedatives are given to put you in a semi-conscious state of relaxation (deep sedation). This is because the amount of sedative needed to produce this state can be hard to predict. Too much of a sedative can produce general anesthesia. Monitored anesthesia care is performed by one or more caregivers who have special training in all types of anesthesia. You will need to meet with these caregivers before your procedure. During this meeting, they will ask you about your medical history. They will also give you instructions to follow. (For example, you will need to stop eating and drinking before your procedure. You may also need to stop or change medications you are taking.) During your  procedure, your caregivers will stay with you. They will:   Watch your condition. This includes watching you blood pressure, breathing, and level of pain.   Diagnose and treat problems that occur.   Give medications if they are needed. These may include calming medications  (sedatives) and anesthetics.   Make sure you are comfortable.  Having monitored anesthesia care does not necessarily mean that you will be under anesthesia. It does mean that your caregivers will be able to manage anesthesia if you need it or if it occurs. It also means that you will be able to have a different type of anesthesia than you are having if you need it. When your procedure is complete, your caregivers will continue to watch your condition. They will make sure any medications wear off before you are allowed to go home.  Document Released: 08/06/2005 Document Revised: 03/07/2013 Document Reviewed: 12/22/2012 [facility] Patient Information 2014 [place], [place].

## 2013-12-21 NOTE — Preoperative (Signed)
Beta Blockers   Reason not to administer Beta Blockers:Not Applicable 

## 2013-12-22 ENCOUNTER — Encounter (HOSPITAL_COMMUNITY): Payer: Self-pay | Admitting: Gastroenterology

## 2014-01-02 ENCOUNTER — Encounter: Payer: BC Managed Care – PPO | Admitting: Family Medicine

## 2014-02-14 ENCOUNTER — Telehealth: Payer: Self-pay | Admitting: Family Medicine

## 2014-02-14 NOTE — Telephone Encounter (Signed)
Please call  Wants ent referral to go back to Dr Dorma RussellKraus. She was there with her daughter today and mentioned to the dr that he was still having hearing problems so they went ahead and scheduled an appointment for him with Dr. Dorma RussellKraus on 03/01/14  for decreased hearing. She needs referral to be sent to Dr. Dorma RussellKraus

## 2014-02-14 NOTE — Telephone Encounter (Signed)
Ok for referral?

## 2014-02-15 NOTE — Telephone Encounter (Signed)
Called office and they were closed. (lunch?) Will try back later.

## 2014-02-15 NOTE — Telephone Encounter (Signed)
Done

## 2014-08-29 ENCOUNTER — Telehealth: Payer: Self-pay | Admitting: Family Medicine

## 2014-08-29 NOTE — Telephone Encounter (Signed)
Needs OV.  (last OV with me was CPE 09/2013, at which time we did discuss his nails.  Needs OV to determine if treatment needed, if I can treat, or if referral is needed)

## 2014-08-29 NOTE — Telephone Encounter (Signed)
LM for parents to call to schedule appt for pt to see Dr Lynelle DoctorKnapp regarding toenail issue

## 2014-08-31 ENCOUNTER — Encounter: Payer: Self-pay | Admitting: Family Medicine

## 2014-08-31 ENCOUNTER — Ambulatory Visit (INDEPENDENT_AMBULATORY_CARE_PROVIDER_SITE_OTHER): Payer: Medicaid Other | Admitting: Family Medicine

## 2014-08-31 ENCOUNTER — Ambulatory Visit: Payer: Medicaid Other | Admitting: Family Medicine

## 2014-08-31 VITALS — BP 112/72 | HR 68 | Resp 12 | Wt 125.0 lb

## 2014-08-31 DIAGNOSIS — B351 Tinea unguium: Secondary | ICD-10-CM

## 2014-08-31 DIAGNOSIS — Z23 Encounter for immunization: Secondary | ICD-10-CM

## 2014-08-31 DIAGNOSIS — B353 Tinea pedis: Secondary | ICD-10-CM

## 2014-08-31 NOTE — Progress Notes (Signed)
Chief Complaint  Patient presents with  . Nail Problem    BOTH FEET . THE FOOT DR.PATIENT HAD BEEN SEEING NEEDED A REFERRAL DUE TO HE HAS NOT BEEN SEEN IN 3 YEARS     2-3 years ago he was treated with a topical medication (painted on) as well as has been treated with an oral medication.  Father cannot recall the names.  The father states the nails never grew in normally--always remained thickened.  He presents requesting referral back to podiatrist (required, since not seen in a long time).  Patient reports having some discomfort when he wears sneakers/boots. He denies any itching of his feet.  No ingrowing toenails.  He otherwise has been doing well.  He has some cough with eating related to his achalasia, but no choking.  He and dad have no other complaints today.  Past Medical History  Diagnosis Date  . Allergy   . Hypothyroidism   . Legg-Calve-Perthes disease   . Down's syndrome   . Viral syndrome 09/1995    hospital viral syndrome.  . Chronic serous otitis media   . Finger fracture 2009    h/o  . Candida esophagitis 04/2012    after ABX for pneumonia  . Heart murmur     "@ birth; resolved" (09/30/2012)  . Pneumonia     multiple hospitalizatons as toddler, but 10 day stay age 71yo  . GERD (gastroesophageal reflux disease)     "maybe" (09/30/2012)  . Peristalsis     "almost absent" (09/30/2012)  . Dysphagia 09/30/2012    now resolved  . OSA on CPAP     He doesn't keep it on very long at night  . Communication problem     "when he feels bad he just communicates w/thumbs up or down or shakes his head; very limited communication normally" (09/30/2012)  . Complication of anesthesia 08/2012    "hard to wake up after balloon dilatation)  . Complication of anesthesia 1999    "respiratory arrest after hip OR; had to get Narcan" (09/30/2012)   Past Surgical History  Procedure Laterality Date  . Hip arthroplasty  10/1998    left; due to Legg-Calve-Perthe dz and subsequent surgery to  remove hardware  . Tympanostomy tube placement  11/1996    bilaterally  . Gum surgery  ~ 2004    graft to gums from roof of mouth.  . Balloon dilation  09/01/2012    Procedure: BALLOON DILATION;  Surgeon: Willis Modena, MD;  Location: [facility];  Service: Endoscopy;  Laterality: N/A;  . Adenoidectomy and myringotomy with tube placement  11/1996    bilaterally  . Esophagogastroduodenoscopy  10/01/2012    Procedure: ESOPHAGOGASTRODUODENOSCOPY (EGD);  Surgeon: Vertell Novak., MD;  Location: [facility] ENDOSCOPY;  Service: Endoscopy;  Laterality: N/A;  . Esophagogastroduodenoscopy  10/01/12  . Laparoscopic heller myotomy  11/11/12    Dr.Farrell  . Laparoscopic surgery  11-16-13   . Esophagogastroduodenoscopy (egd) with propofol N/A 12/21/2013    Procedure: ESOPHAGOGASTRODUODENOSCOPY (EGD) WITH PROPOFOL;  Surgeon: Willis Modena, MD;  Location: [facility];  Service: Endoscopy;  Laterality: N/A;   History   Social History  . Marital Status: Single    Spouse Name: N/A    Number of Children: N/A  . Years of Education: N/A   Occupational History  . Not on file.   Social History Main Topics  . Smoking status: Never Smoker   . Smokeless tobacco: Never Used  . Alcohol Use: No  . Drug Use:  No  . Sexual Activity: Not on file   Other Topics Concern  . Not on file   Social History Narrative  . No narrative on file   Outpatient Encounter Prescriptions as of 08/31/2014  Medication Sig  . bismuth subsalicylate (PEPTO-BISMOL) 262 MG/15ML suspension Take 30 mLs by mouth 2 (two) times daily.  . cetirizine (ZYRTEC) 10 MG tablet Take 10 mg by mouth every morning.   Marland Kitchen. levothyroxine (SYNTHROID, LEVOTHROID) 75 MCG tablet Take 75 mcg by mouth daily before breakfast.  . [DISCONTINUED] acetaminophen (TYLENOL) 500 MG tablet Take 1,000 mg by mouth every 6 (six) hours as needed for mild pain or moderate pain.  . [DISCONTINUED] ibuprofen (ADVIL,MOTRIN) 200 MG tablet Take 800 mg by mouth every 6 (six)  hours as needed for mild pain or moderate pain.  . [DISCONTINUED] levothyroxine (SYNTHROID, LEVOTHROID) 100 MCG tablet Take 75 mcg by mouth daily before breakfast.    No Known Allergies  ROS:  No fevers, chills, URI symptoms, cough (intermittent related to achalasia, after eating).  Denies chest pain.  He has some throat pain after eating.  No nausea, vomiting, diarrhea. Denies rashes, bleeding, bruising.   PHYSICAL EXAM: BP 112/72  Pulse 68  Resp 12  Wt 125 lb (56.7 kg)  Well developed, pleasant male, with Down's facies, in no distress HEENT: PERRL, conjunctiva clear.  OP clear.  Pink crusting on lips from recent Pepto Bismol use. Neck: no lymphadenopathy or mass Heart: regular rate and rhythm Lungs: clear bilaterally  Feet:  2+ pulses.  Thickened discolored nails bilaterally (L: 1st, 3rd through 5th; R:  Discoloration of the first and second, but not thickened.  3rd and 4th are very thickened, 5th, just discolored).  There is maceration between many of the toes (spared between 2nd and 3rd bilaterally).  ASSESSMENT/PLAN:  Onychomycosis - thickened nails causing discomfort.  Failed previous antifungal tx.  [facility] - Plan: Ambulatory referral to Podiatry  Need for prophylactic vaccination and inoculation against influenza - Plan: Flu Vaccine QUAD 36+ mos PF IM (Fluarix Quad PF)  Tinea pedis of both feet - reviewed behavioral measures and OTC meds.

## 2014-08-31 NOTE — Patient Instructions (Signed)

## 2014-09-06 ENCOUNTER — Encounter: Payer: Self-pay | Admitting: Podiatry

## 2014-09-06 ENCOUNTER — Ambulatory Visit (INDEPENDENT_AMBULATORY_CARE_PROVIDER_SITE_OTHER): Payer: BC Managed Care – PPO | Admitting: Podiatry

## 2014-09-06 VITALS — BP 110/78 | HR 92 | Resp 16

## 2014-09-06 DIAGNOSIS — L600 Ingrowing nail: Secondary | ICD-10-CM

## 2014-09-06 DIAGNOSIS — B353 Tinea pedis: Secondary | ICD-10-CM

## 2014-09-06 DIAGNOSIS — B351 Tinea unguium: Secondary | ICD-10-CM

## 2014-09-06 DIAGNOSIS — L603 Nail dystrophy: Secondary | ICD-10-CM

## 2014-09-06 MED ORDER — LULICONAZOLE 1 % EX CREA: 1.0000 "application " | TOPICAL_CREAM | Freq: Every day | CUTANEOUS | Status: DC

## 2014-09-06 NOTE — Progress Notes (Signed)
   Subjective:    Patient ID: Ronald Le, male    DOB: [birth year], 23 y.o.   MRN: [id number]  HPI Ronald Le, 23 year old male, presents today the office his mother for complaints of bilateral skin fungus as well as thick and discolored nails. Patient's mother states that she has noticed peeling between his toes. She has been soaking the feet in vinegar and water soaks. 8 she previously was treated with both Lamisil as well as topical medications for nail fungus. Patient's mother states that there was resolution of the nail fungus however it has returned. Denies any redness or drainage from the nails. Patient's mother does also state the patient wears the same socks multiple times without changing her washing the socks. Patient also like to wear cowboy boots which impinges the toes. No other complaints at this time.   Review of Systems  HENT: Positive for rhinorrhea and sneezing.   Gastrointestinal: Positive for abdominal pain and constipation.  Musculoskeletal: Positive for arthralgias.  Allergic/Immunologic: Positive for environmental allergies.  All other systems reviewed and are negative.      Objective:   Physical Exam Awake, alert, NAD DP/PT pulses palpable bilaterally, CRT less than 3 seconds. Achilles tendon reflex intact. Protective sensation and vibratory sensation difficult to assess. Dry, scaly, pealing skin interdigitally and along the plantar aspect of the foot on a slight erythematous base consistent with tinea pedis. Nails hypertrophic, dystrophic, elongated, brittle, yellow discoloration x10. No surrounding erythema or drainage around the nails. No tenderness to palpation along the nails. No open lesions. No calf pain, swelling, warmth. MMT 5/5, ROM WNL      Assessment & Plan:  23 year old male with likely onychomycosis and tinea pedis. -Treatment options discussed including alternatives, risks, complications. -Discussed the patient's mother as he  previously has had multiple treatments for nail fungus and recurrence we'll biopsy the nail to identify the top region. -For now we'll start Luzu for tinea pedis. A prescription was sent to [facility] patient was given contact information to followup at the pharmacy. -Will call the patient once the results of the nail biopsy results are obtained. In the meantime call the office with any questions, concerns, changes symptoms. Followup with PCP for other issues mentioned and review of systems.

## 2014-09-06 NOTE — Patient Instructions (Signed)

## 2014-09-08 ENCOUNTER — Other Ambulatory Visit: Payer: Self-pay | Admitting: Podiatry

## 2014-09-08 MED ORDER — NAFTIFINE HCL 2 % EX GEL: 1.0000 "application " | Freq: Every day | CUTANEOUS | Status: DC

## 2014-09-20 ENCOUNTER — Encounter: Payer: Self-pay | Admitting: Podiatry

## 2014-09-27 ENCOUNTER — Ambulatory Visit (INDEPENDENT_AMBULATORY_CARE_PROVIDER_SITE_OTHER): Payer: BC Managed Care – PPO | Admitting: Podiatry

## 2014-09-27 VITALS — BP 122/67 | HR 95 | Resp 16

## 2014-09-27 DIAGNOSIS — L603 Nail dystrophy: Secondary | ICD-10-CM

## 2014-09-27 DIAGNOSIS — B353 Tinea pedis: Secondary | ICD-10-CM

## 2014-09-27 MED ORDER — TAVABOROLE 5 % EX SOLN: 1.0000 [drp] | CUTANEOUS | Status: DC

## 2014-09-28 ENCOUNTER — Encounter: Payer: Self-pay | Admitting: Podiatry

## 2014-09-28 NOTE — Progress Notes (Signed)
Patient ID: Ronald Le, male   DOB: 01-02-1991, 23 y.o.   MRN: [id number]  Subjective: Patient returns the office today with his mother for follow-up evaluation and to discuss nail biopsy results. The patient was unable to get luzu cream for tinea pedis, and a prescription for Naftin gel was dispensed. The patient just recently started the treatment at the patient's father passed since last appointment. No acute changes since last appointment. Denies any systemic complaints as fevers, chills, nausea, vomiting. The patient's mother is also asking the nails can be debrided as they are thickened and painful. No other complaints at this time.  Objective: AAO x3, NAD DP/PT pulses palpable bilaterally, CRT less than 3 seconds Protective sensation intact with Simms Weinstein monofilament, vibratory sensation intact Dry, peeling, scaly skin interdigitally on the plantar aspect of the foot with slight erythematous base consistent with tinea pedis. Nails hypertrophic, dystrophic, elongated, brittle, discolored 10. There is no surrounding erythema or drainage. No open lesions. No calf pain with compression, swelling, warmth, erythema  Assessment: 23 year old male with tinea pedis and onychomycosis.  Plan: -Biopsy results were reviewed with the patient and his mother. The results do reveal onychomycosis (T. Rubrum). Various treatment options were discussed. At this time we'll not proceed with any systemic therapy any medical history and we discussed topical treatments. At this time we will proceed with kerydin and a prescription was sent to [facility] and they were given information to follow-up with them. Side effects the medication were discussed and directed to stop immediately if any are to occur and call the office. Also discussed that if they were unable to get the [place] due to cost can proceed with either penlac or OTC treatment.  -Continue naftin  -Follow-up as needed. Call any  questions, concerns, change in symptoms.

## 2014-10-10 ENCOUNTER — Telehealth: Payer: Self-pay

## 2014-10-10 NOTE — Telephone Encounter (Signed)
Faxed over authorization form to [facility]

## 2014-12-13 ENCOUNTER — Ambulatory Visit (INDEPENDENT_AMBULATORY_CARE_PROVIDER_SITE_OTHER): Payer: Medicaid Other | Admitting: Family Medicine

## 2014-12-13 ENCOUNTER — Encounter: Payer: Self-pay | Admitting: Family Medicine

## 2014-12-13 VITALS — BP 110/62 | HR 80 | Temp 100.4°F | Ht <= 58 in | Wt 120.0 lb

## 2014-12-13 DIAGNOSIS — J029 Acute pharyngitis, unspecified: Secondary | ICD-10-CM

## 2014-12-13 DIAGNOSIS — K208 Other esophagitis without bleeding: Secondary | ICD-10-CM

## 2014-12-13 DIAGNOSIS — J069 Acute upper respiratory infection, unspecified: Secondary | ICD-10-CM

## 2014-12-13 LAB — POCT RAPID STREP A (OFFICE): RAPID STREP A SCREEN: NEGATIVE

## 2014-12-13 MED ORDER — AZITHROMYCIN 250 MG PO TABS: ORAL_TABLET | ORAL | Status: DC

## 2014-12-13 MED ORDER — FLUCONAZOLE 150 MG PO TABS: 150.0000 mg | ORAL_TABLET | Freq: Once | ORAL | Status: DC

## 2014-12-13 NOTE — Patient Instructions (Signed)
  Drink plenty of water. Use a decongestant (ie sudafed) to help dry up the nose and the postnasal drainage that is contributing to cough and sore throat. Continue to use guaifenesin as an expectorant to keep the mucus thin. Use dextromethorphan (delsym syrup, or found in combination with guaifenesin frequently) as a cough suppressant. Use salt water gargles, tylenol and/or ibuprofen as needed for pain or fever. Monitor for fevers at home.  If fever increases, and/or if cough and throat worsens over the next 24-48 hours despite these treatments, then start the z-pak Given history of yeast, take the diflucan tablet on the last day of the z-pak.  Return for re-evaluation if ongoing fevers, worsening sore throat, cough, shortness of breath or other concerns.

## 2014-12-13 NOTE — Progress Notes (Signed)
Chief Complaint  Patient presents with  . Cough    and sore throat since last Friday, chest is hurting. Looks pale and just not his normal self, not feeling good.    He started coughing about a week ago.  His sister was sick at the same time with an ear infection.  He hasn't been eating well due to sore throat, complaining of his chest hurting.  He has been laying around more; hasn't felt warm, so temperature wasn't checked.  No vomiting or diarrhea.  Some nausea, mostly related to coughing spells--mother states it sounds like he is going to vomit (hasn't).  He denies shortness of breath, mother denies him appearing to be breathing quickly or short of breath.  Using Robitussin--helped at first, but cough is getting worse.    Past Medical History  Diagnosis Date  . Allergy   . Hypothyroidism   . Legg-Calve-Perthes disease   . Down's syndrome   . Viral syndrome 09/1995    hospital viral syndrome.  . Chronic serous otitis media   . Finger fracture 2009    h/o  . Candida esophagitis 04/2012    after ABX for pneumonia  . Heart murmur     "@ birth; resolved" (09/30/2012)  . Pneumonia     multiple hospitalizatons as toddler, but 10 day stay age 24yo  . GERD (gastroesophageal reflux disease)     "maybe" (09/30/2012)  . Peristalsis     "almost absent" (09/30/2012)  . Dysphagia 09/30/2012    now resolved  . OSA on CPAP     He doesn't keep it on very long at night  . Communication problem     "when he feels bad he just communicates w/thumbs up or down or shakes his head; very limited communication normally" (09/30/2012)  . Complication of anesthesia 08/2012    "hard to wake up after balloon dilatation)  . Complication of anesthesia 1999    "respiratory arrest after hip OR; had to get Narcan" (09/30/2012)   Past Surgical History  Procedure Laterality Date  . Hip arthroplasty  10/1998    left; due to Legg-Calve-Perthe dz and subsequent surgery to remove hardware  . Tympanostomy tube  placement  11/1996    bilaterally  . Gum surgery  ~ 2004    graft to gums from roof of mouth.  . Balloon dilation  09/01/2012    Procedure: BALLOON DILATION;  Surgeon: Willis ModenaWilliam Outlaw, MD;  Location: [facility];  Service: Endoscopy;  Laterality: N/A;  . Adenoidectomy and myringotomy with tube placement  11/1996    bilaterally  . Esophagogastroduodenoscopy  10/01/2012    Procedure: ESOPHAGOGASTRODUODENOSCOPY (EGD);  Surgeon: Vertell NovakJames L Edwards Jr., MD;  [facility];  Service: Endoscopy;  Laterality: N/A;  . Esophagogastroduodenoscopy  10/01/12  . Laparoscopic heller myotomy  11/11/12    Dr.Farrell  . Laparoscopic surgery  11-16-13   . Esophagogastroduodenoscopy (egd) with propofol N/A 12/21/2013    Procedure: ESOPHAGOGASTRODUODENOSCOPY (EGD) WITH PROPOFOL;  Surgeon: Willis ModenaWilliam Outlaw, MD;  Location: [facility];  Service: Endoscopy;  Laterality: N/A;   History   Social History  . Marital Status: Single    Spouse Name: N/A    Number of Children: N/A  . Years of Education: N/A   Occupational History  . Not on file.   Social History Main Topics  . Smoking status: Never Smoker   . Smokeless tobacco: Never Used  . Alcohol Use: No  . Drug Use: No  . Sexual Activity: Not on file  Other Topics Concern  . Not on file   Social History Narrative   Outpatient Encounter Prescriptions as of 12/13/2014  Medication Sig Note  . bismuth subsalicylate (PEPTO-BISMOL) 262 MG/15ML suspension Take 30 mLs by mouth 2 (two) times daily. 12/13/2014: Uses twice daily for reflux  . cetirizine (ZYRTEC) 10 MG tablet Take 10 mg by mouth every morning.    Marland Kitchen levothyroxine (SYNTHROID, LEVOTHROID) 75 MCG tablet Take 75 mcg by mouth daily before breakfast.   . Tavaborole (KERYDIN) 5 % SOLN Apply 1 drop topically 1 day or 1 dose. Apply 1 drop to the toenail daily.   Marland Kitchen azithromycin (ZITHROMAX) 250 MG tablet Take 2 tablets by mouth on first day, then 1 tablet by mouth on days 2 through 5   . dextromethorphan  15 MG/5ML syrup Take 10 mLs by mouth 4 (four) times daily as needed for cough. 12/13/2014: Not using this, just an OTC robitussin  . fluconazole (DIFLUCAN) 150 MG tablet Take 1 tablet (150 mg total) by mouth once.   . [DISCONTINUED] Naftifine HCl (NAFTIN) 2 % GEL Apply 1 application topically daily. (Patient not taking: Reported on 12/13/2014)   (diflucan and z-pak rx'd at today's visit).  No Known Allergies  ROS:  No known fevers, chills, vomiting, diarrhea, abdominal pain, trouble swallowing, bleeding, bruising, rash.  Slight headache today, +sore throat.  No ear pain.  +cough. No shortness of breath.  See HPI  PHYSICAL EXAM: BP 110/62 mmHg  Pulse 80  Temp(Src) 100.4 F (38 C) (Tympanic)  Ht  (1.473 m)  Wt 120 lb (54.432 kg)  BMI 25.09 kg/m2  Quiet male, with occasional deep cough, in no distress HEENT: PERRL, EOMI, conjunctiva clear.  TM's are not well visualized due to small, curvy canals and cerumen (partially impacted).  Visualized portion appears normal.  Nasal mucosa is mildly edematous, no purulence.  OP shows some erythema (mild, diffusely), and some white mucus posteriorly and overlying the right tonsil.  Tongue appears normal, not coated. Sinuses nontender Neck: no lymphadenopathy or mass Heart: regular rate and rhythm Lungs: clear bilaterally, good air movement. No wheezes, rales, ronchi Skin: no rash  Rapid strep negative   ASSESSMENT/PLAN:  Sore throat - likely related to PND; strep negative. may have component of esophagitis (h/o fungal in past), or at risk with ABX - Plan: Rapid Strep A  Fungal esophagitis - treat preventatively along with ABX--can wait to take diflucan until d#5 of ABX - Plan: fluconazole (DIFLUCAN) 150 MG tablet  Acute upper respiratory infection - likely viral given exposure.  expect fever to resolve, and symptoms improve with supportive measures.  If not, start ABX in 1-2 days - Plan: azithromycin (ZITHROMAX) 250 MG tablet  Acute  pharyngitis, unspecified pharyngitis type - Plan: azithromycin (ZITHROMAX) 250 MG tablet   URI, low grade fever, pharyngitis Suspect viral illness.  Drink plenty of water. Use a decongestant (ie sudafed) to help dry up the nose and the postnasal drainage that is contributing to cough and sore throat. Continue to use guaifenesin as an expectorant to keep the mucus thin. Use dextromethorphan (delsym syrup, or found in combination with guaifenesin frequently) as a cough suppressant. Use salt water gargles, tylenol and/or ibuprofen as needed for pain or fever. Monitor for fevers at home.  If fever increases, and/or if cough and throat worsens over the next 24-48 hours despite these treatments, then start the z-pak Given history of yeast, take the diflucan tablet on the last day of the z-pak.  Return for  re-evaluation if ongoing fevers, worsening sore throat, cough, shortness of breath or other concerns.

## 2015-02-27 ENCOUNTER — Encounter: Payer: Self-pay | Admitting: Family Medicine

## 2015-05-01 ENCOUNTER — Encounter: Payer: Self-pay | Admitting: Medical

## 2015-05-01 ENCOUNTER — Ambulatory Visit (INDEPENDENT_AMBULATORY_CARE_PROVIDER_SITE_OTHER): Payer: Medicaid Other | Admitting: Medical

## 2015-05-01 VITALS — BP 98/70 | HR 102 | Temp 98.2°F | Resp 16 | Wt 118.0 lb

## 2015-05-01 DIAGNOSIS — R21 Rash and other nonspecific skin eruption: Secondary | ICD-10-CM

## 2015-05-01 DIAGNOSIS — J069 Acute upper respiratory infection, unspecified: Secondary | ICD-10-CM | POA: Diagnosis not present

## 2015-05-01 MED ORDER — DIPHENHYDRAMINE HCL 25 MG PO TABS: ORAL_TABLET | ORAL | Status: DC

## 2015-05-01 MED ORDER — DEXTROMETHORPHAN HBR 15 MG/5ML PO SYRP: 10.0000 mL | ORAL_SOLUTION | Freq: Four times a day (QID) | ORAL | Status: DC | PRN

## 2015-05-01 NOTE — Progress Notes (Signed)
Subjective:  Ronald Le is a 24 y.o. male who presents for rash, cough.  Accompanied by mother who provides most of the history. Chief Complaint  Patient presents with  . Rash    on chest  . Cough   Symptoms include small prickly rash on chest x 2 weeks, and has had 2 day hx/o sore throat, runny nose, cough, headaches, congestion.   Denies fever, chills, wheezing, ear pain, abdominal pain, back pain, productive cough.  Used some hydrocortisone cream on chest a few days.  Denies sick contacts.  No other aggravating or relieving factors.  No other c/o.  ROS as in subjective.  Past Medical History  Diagnosis Date  . Allergy   . Hypothyroidism   . Legg-Calve-Perthes disease   . Down's syndrome   . Viral syndrome 09/1995    hospital viral syndrome.  . Chronic serous otitis media   . Finger fracture 2009    h/o  . Candida esophagitis 04/2012    after ABX for pneumonia  . Heart murmur     "@ birth; resolved" (09/30/2012)  . Pneumonia     multiple hospitalizatons as toddler, but 10 day stay age 51yo  . GERD (gastroesophageal reflux disease)     "maybe" (09/30/2012)  . Peristalsis     "almost absent" (09/30/2012)  . Dysphagia 09/30/2012    now resolved  . OSA on CPAP     He doesn't keep it on very long at night  . Communication problem     "when he feels bad he just communicates w/thumbs up or down or shakes his head; very limited communication normally" (09/30/2012)  . Complication of anesthesia 08/2012    "hard to wake up after balloon dilatation)  . Complication of anesthesia 1999    "respiratory arrest after hip OR; had to get Narcan" (09/30/2012)    Objective: Filed Vitals:   05/01/15 1447  BP: 98/70  Pulse: 102  Temp: 98.2 F (36.8 C)  Resp: 16    General appearance: Alert, WD/WN, no distress, mildly ill appearing                             Skin: warm, maculopapular scatterd flesh colored rash on chest only                           Head: no sinus  tenderness                            Eyes: conjunctiva normal, corneas clear, PERRLA                            Ears: pearly TMs, external ear canals normal                          Nose: septum midline, turbinates swollen, with erythema and clear discharge             Mouth/throat: MMM, tongue normal, mild pharyngeal erythema                           Neck: supple, no adenopathy, no thyromegaly, nontender  Heart: RRR, normal S1, S2, no murmurs                         Lungs: CTA bilaterally, no wheezes, rales, or rhonchi     Assessment: Encounter Diagnoses  Name Primary?  Marland Kitchen. Upper respiratory infection Yes  . Exanthema   . Rash and nonspecific skin eruption      Plan: Symptoms and exam suggest viral URI, possible viral exanthem.    Ronald Le was seen today for rash and cough.  Diagnoses and all orders for this visit:  Upper respiratory infection  Exanthema  Rash and nonspecific skin eruption  Other orders -     diphenhydrAMINE (BENADRYL) 25 MG tablet; 1/2  Tablet breakfast and lunch, 1 whole tablet at bedtime -     dextromethorphan 15 MG/5ML syrup; Take 10 mLs (30 mg total) by mouth 4 (four) times daily as needed for cough.   Begin diphenhydramine 2-3 times daily, delsym prn, rest, hydration, and if worse or not improving in the next 2-3 days, call back.

## 2015-10-03 ENCOUNTER — Encounter: Payer: Self-pay | Admitting: Family Medicine

## 2015-10-03 ENCOUNTER — Ambulatory Visit (INDEPENDENT_AMBULATORY_CARE_PROVIDER_SITE_OTHER): Payer: Medicaid Other | Admitting: Family Medicine

## 2015-10-03 VITALS — BP 112/62 | HR 68 | Ht <= 58 in | Wt 117.0 lb

## 2015-10-03 DIAGNOSIS — E038 Other specified hypothyroidism: Secondary | ICD-10-CM

## 2015-10-03 DIAGNOSIS — L739 Follicular disorder, unspecified: Secondary | ICD-10-CM | POA: Diagnosis not present

## 2015-10-03 DIAGNOSIS — Q909 Down syndrome, unspecified: Secondary | ICD-10-CM

## 2015-10-03 DIAGNOSIS — Z0000 Encounter for general adult medical examination without abnormal findings: Secondary | ICD-10-CM

## 2015-10-03 DIAGNOSIS — H9193 Unspecified hearing loss, bilateral: Secondary | ICD-10-CM

## 2015-10-03 DIAGNOSIS — Z23 Encounter for immunization: Secondary | ICD-10-CM

## 2015-10-03 DIAGNOSIS — R5383 Other fatigue: Secondary | ICD-10-CM | POA: Diagnosis not present

## 2015-10-03 DIAGNOSIS — G4733 Obstructive sleep apnea (adult) (pediatric): Secondary | ICD-10-CM | POA: Insufficient documentation

## 2015-10-03 DIAGNOSIS — H9190 Unspecified hearing loss, unspecified ear: Secondary | ICD-10-CM | POA: Insufficient documentation

## 2015-10-03 LAB — CBC WITH DIFFERENTIAL/PLATELET
BASOS ABS: 0.1 10*3/uL (ref 0.0–0.1)
Basophils Relative: 1 % (ref 0–1)
Eosinophils Absolute: 0.1 10*3/uL (ref 0.0–0.7)
Eosinophils Relative: 2 % (ref 0–5)
HEMATOCRIT: 46.9 % (ref 39.0–52.0)
Hemoglobin: 16.1 g/dL (ref 13.0–17.0)
LYMPHS ABS: 1.6 10*3/uL (ref 0.7–4.0)
LYMPHS PCT: 27 % (ref 12–46)
MCH: 32 pg (ref 26.0–34.0)
MCHC: 34.3 g/dL (ref 30.0–36.0)
MCV: 93.2 fL (ref 78.0–100.0)
MPV: 8.8 fL (ref 8.6–12.4)
Monocytes Absolute: 0.5 10*3/uL (ref 0.1–1.0)
Monocytes Relative: 8 % (ref 3–12)
NEUTROS ABS: 3.8 10*3/uL (ref 1.7–7.7)
NEUTROS PCT: 62 % (ref 43–77)
PLATELETS: 194 10*3/uL (ref 150–400)
RBC: 5.03 MIL/uL (ref 4.22–5.81)
RDW: 14.2 % (ref 11.5–15.5)
WBC: 6.1 10*3/uL (ref 4.0–10.5)

## 2015-10-03 LAB — TSH: TSH: 7.093 u[IU]/mL — AB (ref 0.350–4.500)

## 2015-10-03 MED ORDER — FLUCONAZOLE 150 MG PO TABS: 150.0000 mg | ORAL_TABLET | Freq: Once | ORAL | Status: DC

## 2015-10-03 MED ORDER — DOXYCYCLINE HYCLATE 100 MG PO TABS: 100.0000 mg | ORAL_TABLET | Freq: Two times a day (BID) | ORAL | Status: DC

## 2015-10-03 NOTE — Patient Instructions (Addendum)
  HEALTH MAINTENANCE RECOMMENDATIONS:  It is recommended that you get at least 30 minutes of aerobic exercise at least 5 days/week. This can be any activity that gets your heart rate up. This can be divided in 10-15 minute intervals if needed, but try and build up your endurance at least once a week.  Weight bearing exercise is also recommended twice weekly.  Eat a healthy diet with lots of vegetables, fruits and fiber.  "Colorful" foods have a lot of vitamins (ie green vegetables, tomatoes, red peppers, etc).  Limit sweet tea, regular sodas and alcoholic beverages, all of which has a lot of calories and sugar.  Up to 2 alcoholic drinks daily may be beneficial for men (unless trying to lose weight, watch sugars).  Drink a lot of water.  Sunscreen of at least SPF 30 should be used on all sun-exposed parts of the skin when outside between the hours of 10 am and 4 pm (not just when at beach or pool, but even with exercise, golf, tennis, and yard work!)  Use a sunscreen that says "broad spectrum" so it covers both UVA and UVB rays, and make sure to reapply every 1-2 hours.  Check your testicles once a month, and return for evaluation if any lumps are noted.  Your exam was normal today.  Remember to change the batteries in your smoke detectors when changing your clock times in the spring and fall.  Use your seat belt every time you are in a car, and please drive safely and not be distracted with cell phones and texting while driving.  It is important that you treat your sleep apnea.  Please use your CPAP machine--if it is uncomfortable, different types of masks can be tried.  If you still won't use the machine, check with your dentist about oral devices.  Please schedule a dentist visit.  Take the antibiotics to treat the folliculitis on the scalp and legs.  Take the fluconazole to prevent complications of yeast infection from the antibiotic (take first dose after 3-4 days, and repeat second dose a week  later.
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
## 2015-10-04 ENCOUNTER — Other Ambulatory Visit: Payer: Self-pay | Admitting: *Deleted

## 2015-10-04 ENCOUNTER — Encounter: Payer: Self-pay | Admitting: Family Medicine

## 2015-10-04 DIAGNOSIS — E559 Vitamin D deficiency, unspecified: Secondary | ICD-10-CM | POA: Insufficient documentation

## 2015-10-04 LAB — COMPREHENSIVE METABOLIC PANEL
ALBUMIN: 4.2 g/dL (ref 3.6–5.1)
ALT: 22 U/L (ref 9–46)
AST: 21 U/L (ref 10–40)
Alkaline Phosphatase: 60 U/L (ref 40–115)
BUN: 12 mg/dL (ref 7–25)
CALCIUM: 9.6 mg/dL (ref 8.6–10.3)
CHLORIDE: 101 mmol/L (ref 98–110)
CO2: 26 mmol/L (ref 20–31)
Creat: 0.94 mg/dL (ref 0.60–1.35)
Glucose, Bld: 88 mg/dL (ref 65–99)
POTASSIUM: 3.8 mmol/L (ref 3.5–5.3)
SODIUM: 141 mmol/L (ref 135–146)
Total Bilirubin: 0.6 mg/dL (ref 0.2–1.2)
Total Protein: 7 g/dL (ref 6.1–8.1)

## 2015-10-04 LAB — VITAMIN D 25 HYDROXY (VIT D DEFICIENCY, FRACTURES): Vit D, 25-Hydroxy: 19 ng/mL — ABNORMAL LOW (ref 30–100)

## 2015-10-04 LAB — LIPID PANEL
CHOL/HDL RATIO: 4 ratio (ref <=5.0)
CHOLESTEROL: 157 mg/dL (ref 125–200)
HDL: 39 mg/dL — AB (ref >=40)
LDL Cholesterol: 78 mg/dL (ref <130)
TRIGLYCERIDES: 201 mg/dL — AB (ref <150)
VLDL: 40 mg/dL — ABNORMAL HIGH (ref <30)

## 2015-10-04 MED ORDER — ERGOCALCIFEROL 1.25 MG (50000 UT) PO CAPS
50000.0000 [IU] | ORAL_CAPSULE | ORAL | Status: DC
Start: 2015-10-04 — End: 2017-01-21

## 2015-12-12 ENCOUNTER — Other Ambulatory Visit: Payer: Self-pay | Admitting: Medical

## 2015-12-13 NOTE — Telephone Encounter (Signed)
Ronald Le this looks like it is a dr knapp pt and this rx is not under medication list, but zyrtec is?

## 2015-12-17 NOTE — Telephone Encounter (Signed)
Is this okay to refill? 

## 2015-12-17 NOTE — Telephone Encounter (Signed)
This was prescribed when he had a cold and saw Vincenza Hews in June.  He doesn't need to be taking this, just the zyrtec once daily.

## 2016-01-16 ENCOUNTER — Encounter: Payer: Self-pay | Admitting: Podiatry

## 2016-01-16 ENCOUNTER — Ambulatory Visit (INDEPENDENT_AMBULATORY_CARE_PROVIDER_SITE_OTHER): Payer: Medicaid Other | Admitting: Podiatry

## 2016-01-16 VITALS — BP 121/76 | HR 68 | Resp 16

## 2016-01-16 DIAGNOSIS — M79604 Pain in right leg: Secondary | ICD-10-CM

## 2016-01-16 DIAGNOSIS — M79605 Pain in left leg: Secondary | ICD-10-CM

## 2016-01-16 DIAGNOSIS — M79676 Pain in unspecified toe(s): Secondary | ICD-10-CM

## 2016-01-16 DIAGNOSIS — L603 Nail dystrophy: Secondary | ICD-10-CM

## 2016-01-16 DIAGNOSIS — B351 Tinea unguium: Secondary | ICD-10-CM

## 2016-01-17 NOTE — Progress Notes (Signed)
Subjective:     Patient ID: Ronald Le, male   DOB: [birth year], 25 y.o.   MRN: [id number]  HPI patient presents with mother with thick and brittle nailbeds 1-5 both feet that are impossible for them to cut and painful   Review of Systems     Objective:   Physical Exam Neurovascular status unchanged with thick yellow brittle nailbeds 1-5 both feet that are painful and impossible to cut    Assessment:     Chronic mycotic nail infections which have been treated systemically previously with no success with pain    Plan:     Debride painful nailbeds 1-5 both feet with no iatrogenic bleeding noted

## 2016-03-05 ENCOUNTER — Telehealth: Payer: Self-pay

## 2016-03-05 NOTE — Telephone Encounter (Signed)
Mom called and stated that Blake DivineShauna Nyra JabsChristine Glynn is ok to bring pt to the doctor since pts mom is going out of town. Wanted it noted chart.

## 2016-04-22 ENCOUNTER — Encounter: Payer: Self-pay | Admitting: Podiatry

## 2016-04-22 ENCOUNTER — Ambulatory Visit (INDEPENDENT_AMBULATORY_CARE_PROVIDER_SITE_OTHER): Payer: Medicaid Other | Admitting: Podiatry

## 2016-04-22 DIAGNOSIS — B351 Tinea unguium: Secondary | ICD-10-CM | POA: Diagnosis not present

## 2016-04-22 DIAGNOSIS — M79676 Pain in unspecified toe(s): Secondary | ICD-10-CM

## 2016-04-22 NOTE — Progress Notes (Signed)
Patient ID: Stein M Goodwill, male   DOB: 1991/01/01, 25 y.o.Felicity Coyer   MRN: [id number] Complaint:  Visit Type: Patient returns to my office for continued preventative foot care services. Complaint: Patient states" my nails have grown long and thick and become painful to walk and wear shoes" Patient has been diagnosed with DM with no foot complications. The patient presents for preventative foot care services. No changes to ROS  Podiatric Exam: Vascular: dorsalis pedis and posterior tibial pulses are palpable bilateral. Capillary return is immediate. Temperature gradient is WNL. Skin turgor WNL  Sensorium: Normal Semmes Weinstein monofilament test. Normal tactile sensation bilaterally. Nail Exam: Pt has thick disfigured discolored nails with subungual debris noted bilateral entire nail hallux through fifth toenails Ulcer Exam: There is no evidence of ulcer or pre-ulcerative changes or infection. Orthopedic Exam: Muscle tone and strength are WNL. No limitations in general ROM. No crepitus or effusions noted. Foot type and digits show no abnormalities. HAV  B/L Skin: No Porokeratosis. No infection or ulcers  Diagnosis:  Onychomycosis, , Pain in right toe, pain in left toes  Treatment & Plan Procedures and Treatment: Consent by patient was obtained for treatment procedures. The patient understood the discussion of treatment and procedures well. All questions were answered thoroughly reviewed. Debridement of mycotic and hypertrophic toenails, 1 through 5 bilateral and clearing of subungual debris. No ulceration, no infection noted. [facility] Procedure: Patient instructed to return to the office for a follow up visit 3 months for continued evaluation and treatment.    Helane GuntherGregory Altha Sweitzer DPM

## 2016-04-25 NOTE — Addendum Note (Signed)
Addended by: Alphia Kava'CONNELL, VALERY D on: 04/25/2016 10:10 AM   Modules accepted: Orders

## 2016-05-06 NOTE — Progress Notes (Signed)
Onychomycosis and trauma  Helane GuntherGregory Caelen Higinbotham DPM

## 2016-05-09 ENCOUNTER — Encounter: Payer: Self-pay | Admitting: Podiatry

## 2016-07-22 ENCOUNTER — Ambulatory Visit: Payer: Medicaid Other | Admitting: Podiatry

## 2016-08-19 ENCOUNTER — Ambulatory Visit: Payer: Medicaid Other | Admitting: Podiatry
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
## 2016-10-06 ENCOUNTER — Encounter: Payer: Medicaid Other | Admitting: Family Medicine

## 2016-10-06 ENCOUNTER — Telehealth: Payer: Self-pay | Admitting: *Deleted

## 2016-10-06 NOTE — Telephone Encounter (Signed)

## 2016-10-06 NOTE — Telephone Encounter (Signed)
Needs no show letter and be charged no show fee.

## 2016-10-15 ENCOUNTER — Encounter: Payer: Self-pay | Admitting: Family Medicine

## 2016-10-15 NOTE — Telephone Encounter (Signed)
No show letter sent/ can not access no show fee due to pt's insurance

## 2016-12-10 ENCOUNTER — Ambulatory Visit: Payer: Medicaid Other | Admitting: Family Medicine

## 2017-01-19 DIAGNOSIS — E039 Hypothyroidism, unspecified: Secondary | ICD-10-CM | POA: Diagnosis not present
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
## 2017-01-21 ENCOUNTER — Ambulatory Visit (INDEPENDENT_AMBULATORY_CARE_PROVIDER_SITE_OTHER): Payer: Medicare Other | Admitting: Family Medicine

## 2017-01-21 ENCOUNTER — Encounter: Payer: Self-pay | Admitting: Family Medicine

## 2017-01-21 ENCOUNTER — Telehealth: Payer: Self-pay | Admitting: *Deleted

## 2017-01-21 VITALS — BP 100/60 | HR 84 | Ht <= 58 in | Wt 110.2 lb

## 2017-01-21 DIAGNOSIS — Q909 Down syndrome, unspecified: Secondary | ICD-10-CM | POA: Diagnosis not present

## 2017-01-21 DIAGNOSIS — M542 Cervicalgia: Secondary | ICD-10-CM

## 2017-01-21 DIAGNOSIS — E559 Vitamin D deficiency, unspecified: Secondary | ICD-10-CM | POA: Diagnosis not present

## 2017-01-21 DIAGNOSIS — R5383 Other fatigue: Secondary | ICD-10-CM

## 2017-01-21 DIAGNOSIS — F329 Major depressive disorder, single episode, unspecified: Secondary | ICD-10-CM | POA: Diagnosis not present

## 2017-01-21 DIAGNOSIS — E038 Other specified hypothyroidism: Secondary | ICD-10-CM | POA: Diagnosis not present

## 2017-01-21 DIAGNOSIS — R4589 Other symptoms and signs involving emotional state: Secondary | ICD-10-CM

## 2017-01-21 LAB — CBC WITH DIFFERENTIAL/PLATELET
Basophils Absolute: 66 cells/uL (ref 0–200)
Basophils Relative: 1 %
Eosinophils Absolute: 132 cells/uL (ref 15–500)
Eosinophils Relative: 2 %
HEMATOCRIT: 46.4 % (ref 38.5–50.0)
Hemoglobin: 15.8 g/dL (ref 13.2–17.1)
LYMPHS PCT: 26 %
Lymphs Abs: 1716 cells/uL (ref 850–3900)
MCH: 31.7 pg (ref 27.0–33.0)
MCHC: 34.1 g/dL (ref 32.0–36.0)
MCV: 93 fL (ref 80.0–100.0)
MONOS PCT: 11 %
MPV: 8.5 fL (ref 7.5–12.5)
Monocytes Absolute: 726 cells/uL (ref 200–950)
NEUTROS PCT: 60 %
Neutro Abs: 3960 cells/uL (ref 1500–7800)
PLATELETS: 192 10*3/uL (ref 140–400)
RBC: 4.99 MIL/uL (ref 4.20–5.80)
RDW: 14.7 % (ref 11.0–15.0)
WBC: 6.6 10*3/uL (ref 4.0–10.5)

## 2017-01-21 LAB — COMPREHENSIVE METABOLIC PANEL
ALT: 14 U/L (ref 9–46)
AST: 16 U/L (ref 10–40)
Albumin: 4.2 g/dL (ref 3.6–5.1)
Alkaline Phosphatase: 64 U/L (ref 40–115)
BILIRUBIN TOTAL: 0.6 mg/dL (ref 0.2–1.2)
BUN: 7 mg/dL (ref 7–25)
CALCIUM: 9.1 mg/dL (ref 8.6–10.3)
CO2: 26 mmol/L (ref 20–31)
Chloride: 104 mmol/L (ref 98–110)
Creat: 1.01 mg/dL (ref 0.60–1.35)
GLUCOSE: 91 mg/dL (ref 65–99)
Potassium: 3.9 mmol/L (ref 3.5–5.3)
SODIUM: 141 mmol/L (ref 135–146)
Total Protein: 7.1 g/dL (ref 6.1–8.1)

## 2017-01-21 NOTE — Telephone Encounter (Signed)
Patients mother advised  

## 2017-01-21 NOTE — Telephone Encounter (Signed)
He was wearing a hat, and this wasn't brought to my attention by her during the visit, as we spent a significant amount of time discussing other concerns. He can try using a dandruff shampoo such as selsun blue or head and shoulders--be sure to leave on the scalp for at least 5 minutes before rinsing.  This should help if there is scaling. At last visit (09/2015) he wasn't having scaling, just the bumps and was treated for a folliculitis (had pustules).  I would not want to do this if having scaling (due to his issues with yeast)--try the shampoo for 2 weeks first.  If ongoing issues, may need to look at to determine if needs additional treatment.

## 2017-01-21 NOTE — Telephone Encounter (Signed)
Olegario MessierKathy stopped me in the hallway when checking out and said that Raylee's scalp was not looked at. Same bumps and scaling as last time. Can you call something in or recommend a certain shampoo?

## 2017-01-21 NOTE — Patient Instructions (Addendum)
We will be in touch with the blood test results in 1-2 days. [facility]. Work on proper posture, stretches and trial of heat as needed for neck pain.  Try and get 30 minutes of regular exercise every day.  We are referring you for counseling, given the significant life stressors. Contact the endocrinologist regarding dose of thyroid medication.  It looks like the 100mcg dose is a little too much.  If the neck x-ray is normal, but you are having ongoing problems with pain, we may consider physical therapy.  We may need to consider whether or not another sleep study is needed. I would recommend this if you hear long pauses in breathing when asleep, if he wakes up feeling tired and unrefreshed, or dozes off a lot during the day with adequate sleep at night, we should consider another test.

## 2017-01-22 LAB — VITAMIN D 25 HYDROXY (VIT D DEFICIENCY, FRACTURES): VIT D 25 HYDROXY: 18 ng/mL — AB (ref 30–100)

## 2017-01-26 ENCOUNTER — Other Ambulatory Visit: Payer: Self-pay | Admitting: *Deleted

## 2017-01-26 MED ORDER — VITAMIN D (ERGOCALCIFEROL) 1.25 MG (50000 UNIT) PO CAPS: 50000.0000 [IU] | ORAL_CAPSULE | ORAL | 0 refills | Status: DC

## 2017-02-03 ENCOUNTER — Telehealth: Payer: Self-pay | Admitting: Family Medicine

## 2017-02-03 ENCOUNTER — Encounter: Payer: Self-pay | Admitting: Family Medicine

## 2017-02-03 NOTE — Telephone Encounter (Signed)
Mom called and needs letter for taxes showing pt is disabled and special needs.  Letter completed and mom will pick up this morning.

## 2017-02-05 ENCOUNTER — Ambulatory Visit
Admission: RE | Admit: 2017-02-05 | Discharge: 2017-02-05 | Disposition: A | Discharge status: Home / Self Care | Payer: Medicaid Other | Source: Ambulatory Visit | Attending: Family Medicine | Admitting: Family Medicine

## 2017-02-05 DIAGNOSIS — M542 Cervicalgia: Secondary | ICD-10-CM

## 2017-02-05 DIAGNOSIS — Q909 Down syndrome, unspecified: Secondary | ICD-10-CM

## 2017-02-26 ENCOUNTER — Telehealth: Payer: Self-pay | Admitting: Family Medicine

## 2017-02-26 NOTE — Telephone Encounter (Signed)
Left message for mother to call. Need insurance info.

## 2017-03-06 NOTE — Telephone Encounter (Signed)
Left message for mother to call. Need medicare id number.
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
## 2017-04-30 ENCOUNTER — Encounter: Payer: Self-pay | Admitting: Family Medicine

## 2017-04-30 ENCOUNTER — Ambulatory Visit (INDEPENDENT_AMBULATORY_CARE_PROVIDER_SITE_OTHER): Payer: Medicare Other | Admitting: Family Medicine

## 2017-04-30 VITALS — BP 100/60 | HR 88 | Ht <= 58 in | Wt 109.6 lb

## 2017-04-30 DIAGNOSIS — E038 Other specified hypothyroidism: Secondary | ICD-10-CM

## 2017-04-30 DIAGNOSIS — M549 Dorsalgia, unspecified: Secondary | ICD-10-CM

## 2017-04-30 DIAGNOSIS — B351 Tinea unguium: Secondary | ICD-10-CM

## 2017-04-30 DIAGNOSIS — Z0000 Encounter for general adult medical examination without abnormal findings: Secondary | ICD-10-CM | POA: Diagnosis not present

## 2017-04-30 DIAGNOSIS — K220 Achalasia of cardia: Secondary | ICD-10-CM | POA: Diagnosis not present

## 2017-04-30 DIAGNOSIS — G8929 Other chronic pain: Secondary | ICD-10-CM

## 2017-04-30 DIAGNOSIS — M542 Cervicalgia: Secondary | ICD-10-CM | POA: Diagnosis not present

## 2017-04-30 DIAGNOSIS — B3781 Candidal esophagitis: Secondary | ICD-10-CM | POA: Diagnosis not present

## 2017-04-30 DIAGNOSIS — E559 Vitamin D deficiency, unspecified: Secondary | ICD-10-CM | POA: Diagnosis not present

## 2017-04-30 DIAGNOSIS — Q909 Down syndrome, unspecified: Secondary | ICD-10-CM

## 2017-04-30 DIAGNOSIS — G4733 Obstructive sleep apnea (adult) (pediatric): Secondary | ICD-10-CM | POA: Diagnosis not present

## 2017-04-30 LAB — POCT URINALYSIS DIPSTICK
BILIRUBIN UA: NEGATIVE
GLUCOSE UA: NEGATIVE
KETONES UA: NEGATIVE
Leukocytes, UA: NEGATIVE
Nitrite, UA: NEGATIVE
Protein, UA: NEGATIVE
RBC UA: NEGATIVE
UROBILINOGEN UA: NEGATIVE U/dL — AB
pH, UA: 6 (ref 5.0–8.0)

## 2017-04-30 NOTE — Patient Instructions (Addendum)
  HEALTH MAINTENANCE RECOMMENDATIONS:  It is recommended that you get at least 30 minutes of aerobic exercise at least 5 days/week (for weight loss, you may need as much as 60-90 minutes). This can be any activity that gets your heart rate up. This can be divided in 10-15 minute intervals if needed, but try and build up your endurance at least once a week.  Weight bearing exercise is also recommended twice weekly.  Eat a healthy diet with lots of vegetables, fruits and fiber.  "Colorful" foods have a lot of vitamins (ie green vegetables, tomatoes, red peppers, etc).  Limit sweet tea, regular sodas and alcoholic beverages, all of which has a lot of calories and sugar.  Up to 2 alcoholic drinks daily may be beneficial for men (unless trying to lose weight, watch sugars).  Drink a lot of water.  Sunscreen of at least SPF 30 should be used on all sun-exposed parts of the skin when outside between the hours of 10 am and 4 pm (not just when at beach or pool, but even with exercise, golf, tennis, and yard work!)  Use a sunscreen that says "broad spectrum" so it covers both UVA and UVB rays, and make sure to reapply every 1-2 hours.  Remember to change the batteries in your smoke detectors when changing your clock times in the spring and fall.  Use your seat belt every time you are in a car, and please drive safely and not be distracted with cell phones and texting while driving.  Remember to start taking 1000 IU of Vitamin D3 daily once you finish the prescription  We are going to be referring Turki for physical therapy for his back and neck pain.  We are also sending him back to the gastroenterologist and the podiatrist. Let us know if you don't hear anything about these appointments in the next week or two.

## 2017-05-07 ENCOUNTER — Other Ambulatory Visit: Payer: Self-pay | Admitting: Gastroenterology

## 2017-05-07 DIAGNOSIS — K219 Gastro-esophageal reflux disease without esophagitis: Secondary | ICD-10-CM | POA: Diagnosis not present

## 2017-05-07 DIAGNOSIS — R1310 Dysphagia, unspecified: Secondary | ICD-10-CM

## 2017-05-07 DIAGNOSIS — K220 Achalasia of cardia: Secondary | ICD-10-CM

## 2017-05-07 DIAGNOSIS — R1013 Epigastric pain: Secondary | ICD-10-CM

## 2017-05-11 ENCOUNTER — Ambulatory Visit
Admission: RE | Admit: 2017-05-11 | Discharge: 2017-05-11 | Disposition: A | Discharge status: Home / Self Care | Payer: Medicaid Other | Source: Ambulatory Visit | Attending: Gastroenterology | Admitting: Gastroenterology

## 2017-05-11 DIAGNOSIS — R1013 Epigastric pain: Secondary | ICD-10-CM | POA: Diagnosis not present

## 2017-05-11 DIAGNOSIS — R1310 Dysphagia, unspecified: Secondary | ICD-10-CM

## 2017-05-11 DIAGNOSIS — K220 Achalasia of cardia: Secondary | ICD-10-CM

## 2017-05-14 ENCOUNTER — Encounter: Payer: Self-pay | Admitting: Internal Medicine

## 2017-05-22 ENCOUNTER — Ambulatory Visit (INDEPENDENT_AMBULATORY_CARE_PROVIDER_SITE_OTHER): Payer: Medicare Other | Admitting: Podiatry

## 2017-05-22 ENCOUNTER — Encounter: Payer: Self-pay | Admitting: Podiatry

## 2017-05-22 DIAGNOSIS — M79605 Pain in left leg: Secondary | ICD-10-CM

## 2017-05-22 DIAGNOSIS — M79604 Pain in right leg: Secondary | ICD-10-CM

## 2017-05-22 DIAGNOSIS — B351 Tinea unguium: Secondary | ICD-10-CM | POA: Diagnosis not present

## 2017-05-22 DIAGNOSIS — M79676 Pain in unspecified toe(s): Secondary | ICD-10-CM | POA: Diagnosis not present

## 2017-05-25 ENCOUNTER — Ambulatory Visit: Payer: Medicare Other | Attending: Family Medicine | Admitting: Physical Therapy

## 2017-05-25 ENCOUNTER — Encounter: Payer: Self-pay | Admitting: Physical Therapy

## 2017-05-25 DIAGNOSIS — G8929 Other chronic pain: Secondary | ICD-10-CM | POA: Diagnosis not present

## 2017-05-25 DIAGNOSIS — R293 Abnormal posture: Secondary | ICD-10-CM | POA: Insufficient documentation

## 2017-05-25 DIAGNOSIS — M545 Low back pain: Secondary | ICD-10-CM | POA: Insufficient documentation

## 2017-05-25 DIAGNOSIS — M542 Cervicalgia: Secondary | ICD-10-CM | POA: Diagnosis not present

## 2017-05-25 DIAGNOSIS — M62830 Muscle spasm of back: Secondary | ICD-10-CM | POA: Diagnosis not present
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
## 2017-06-01 ENCOUNTER — Ambulatory Visit: Payer: Medicare Other | Admitting: Physical Therapy

## 2017-06-04 ENCOUNTER — Ambulatory Visit: Payer: Medicare Other | Admitting: Physical Therapy

## 2017-06-04 ENCOUNTER — Telehealth: Payer: Self-pay | Admitting: Physical Therapy

## 2017-06-04 NOTE — Telephone Encounter (Signed)
LVM regarding missed appointment today 1:30. If he cannot make the scheduled appointment then they can call and cancel or reschedule the appointment. Stated when the next appointment time was and if they couldn't make to call and let us know ahead of time.

## 2017-06-08 ENCOUNTER — Ambulatory Visit: Payer: Medicare Other | Admitting: Physical Therapy

## 2017-06-08 DIAGNOSIS — G8929 Other chronic pain: Secondary | ICD-10-CM | POA: Diagnosis not present

## 2017-06-08 DIAGNOSIS — M545 Low back pain: Secondary | ICD-10-CM | POA: Diagnosis not present

## 2017-06-08 DIAGNOSIS — M542 Cervicalgia: Secondary | ICD-10-CM

## 2017-06-08 DIAGNOSIS — M62830 Muscle spasm of back: Secondary | ICD-10-CM | POA: Diagnosis not present

## 2017-06-08 DIAGNOSIS — R293 Abnormal posture: Secondary | ICD-10-CM | POA: Diagnosis not present

## 2017-06-09 ENCOUNTER — Ambulatory Visit (HOSPITAL_COMMUNITY)
Admission: EM | Admit: 2017-06-09 | Discharge: 2017-06-09 | Disposition: A | Discharge status: Home / Self Care | Payer: Medicare Other | Attending: Internal Medicine | Admitting: Internal Medicine

## 2017-06-09 ENCOUNTER — Encounter (HOSPITAL_COMMUNITY): Payer: Self-pay | Admitting: Emergency Medicine

## 2017-06-09 ENCOUNTER — Encounter: Payer: Self-pay | Admitting: Physical Therapy

## 2017-06-09 DIAGNOSIS — S61411A Laceration without foreign body of right hand, initial encounter: Secondary | ICD-10-CM

## 2017-06-09 NOTE — Discharge Instructions (Signed)
Keep clean and dry, watch for any signs of infection, pus, swelling, redness, increased tenderness. Sutures out in about 7-8 days. For any problems may return Especially for signs of infection.
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
## 2017-06-09 NOTE — Therapy (Signed)
[facility] [address] [place], [place], [address] Phone: [phone]   Fax:  [phone]  Physical Therapy Treatment  Patient Details  Name: Ronald Le MRN: [id number] Date of Birth: Oct 18, 1991 Referring Provider: Joselyn ArrowKnapp, Eve, MD  Encounter Date: 06/08/2017      PT End of Session - 06/09/17 1217    Visit Number 2   Number of Visits 13   Date for PT Re-Evaluation 07/06/17   Authorization Type MCR: KX mod by 15th visit, Progress note by 10th or 8/2/   PT Start Time 1500   PT Stop Time 1542   PT Time Calculation (min) 42 min   Activity Tolerance Patient tolerated treatment well   Behavior [facility] for tasks assessed/performed      Past Medical History:  Diagnosis Date  . Allergy   . Candida esophagitis (HCC) 04/2012   after ABX for pneumonia  . Chronic serous otitis media   . Communication problem    "when he feels bad he just communicates w/thumbs up or down or shakes his head; very limited communication normally" (09/30/2012)  . Complication of anesthesia 08/2012   "hard to wake up after balloon dilatation)  . Complication of anesthesia 1999   "respiratory arrest after hip OR; had to get Narcan" (09/30/2012)  . Down's syndrome   . Dysphagia 09/30/2012   now resolved  . Finger fracture 2009   h/o  . GERD (gastroesophageal reflux disease)    "maybe" (09/30/2012)  . Heart murmur    "@ birth; resolved" (09/30/2012)  . Hypothyroidism   . Legg-Calve-Perthes disease   . OSA on CPAP    noncompliant with use of CPAP  . Peristalsis    "almost absent" (09/30/2012)  . Pneumonia    multiple hospitalizatons as toddler, but 10 day stay age 26yo  . Viral syndrome 09/1995   hospital viral syndrome.    Past Surgical History:  Procedure Laterality Date  . ADENOIDECTOMY AND MYRINGOTOMY WITH TUBE PLACEMENT  11/1996   bilaterally  . BALLOON DILATION  09/01/2012   Procedure: BALLOON DILATION;  Surgeon: Willis ModenaWilliam  Outlaw, MD;  Location: [facility];  Service: Endoscopy;  Laterality: N/A;  . ESOPHAGOGASTRODUODENOSCOPY  10/01/2012   Procedure: ESOPHAGOGASTRODUODENOSCOPY (EGD);  Surgeon: Vertell NovakJames L Edwards Jr., MD;  [facility];  Service: Endoscopy;  Laterality: N/A;  . ESOPHAGOGASTRODUODENOSCOPY  10/01/12  . ESOPHAGOGASTRODUODENOSCOPY (EGD) WITH PROPOFOL N/A 12/21/2013   Procedure: ESOPHAGOGASTRODUODENOSCOPY (EGD) WITH PROPOFOL;  Surgeon: Willis ModenaWilliam Outlaw, MD;  Location: [facility]OS[facility];  Service: Endoscopy;  Laterality: N/A;  . GUM SURGERY  ~ 2004   graft to gums from roof of mouth.  Marland Kitchen. HIP ARTHROPLASTY  10/1998   left; due to Legg-Calve-Perthe dz and subsequent surgery to remove hardware  . LAPAROSCOPIC HELLER MYOTOMY  11/11/12   Dr.Farrell  . laparoscopic surgery  11-16-13   . TYMPANOSTOMY TUBE PLACEMENT  11/1996   bilaterally    There were no vitals filed for this visit.      Subjective Assessment - 06/09/17 1200    Subjective Patients mom reports he was sore after the last visit but overall she has not noticed much of a difference. The patient seems to have no complanits.    Pertinent History hx of down syndrome   Limitations Lifting   How long can you sit comfortably? 15 min   How long can you stand comfortably? 60 min   How long can you walk comfortably? 60 min   Diagnostic tests x-ray for  neck   Patient Stated Goals get legs strongewr   Currently in Pain? Yes   Pain Score 4   Faces score 4/10    Pain Location Neck   Pain Orientation Right;Left   Pain Descriptors / Indicators Tightness   Pain Type Chronic pain   Pain Onset More than a month ago   Pain Frequency Intermittent   Aggravating Factors  looking up    Pain Relieving Factors get out of position    Pain Score 4  using the faces scale    Pain Location Back   Pain Orientation Left   Pain Descriptors / Indicators Sore   Pain Type Chronic pain   Pain Onset More than a month ago   Pain Frequency Intermittent    Aggravating Factors  leaning forward and leaning backwards    Pain Relieving Factors resting and heating                          [facility] Adult PT Treatment/Exercise - 06/09/17 0001      Neck Exercises: Standing   Other Standing Exercises scap retraction 2x10 yellow; shooulder extension 2x10 yellow; moderate cuing for each;      Neck Exercises: Supine   Neck Retraction 10 reps;5 secs  moderate cuing      Lumbar Exercises: Stretches   Active Hamstring Stretch 2 reps;30 seconds   Lower Trunk Rotation Limitations 2x10     Lumbar Exercises: Supine   Other Supine Lumbar Exercises supine ball squeeze 2x10; supine clam shell 2x10 yellow;      Manual Therapy   Manual Therapy Soft tissue mobilization;Passive ROM   Soft tissue mobilization to cervical paraspinals and upper traps to reduce muslce spasming and pain/    Passive ROM PROM of the hips into flexion ER and IR                 PT Education - 06/09/17 1215    Education provided Yes   Education Details improtance of continued exercises.    Person(s) Educated Patient;Parent(s)   Methods Explanation;Demonstration;Tactile cues;Verbal cues   Comprehension Verbalized understanding;Returned demonstration;Verbal cues required;Tactile cues required          PT Short Term Goals - 05/25/17 1751      PT SHORT TERM GOAL #1   Title pt to be Min A with initial HEP (06/15/2017)   Time 3   Period Weeks   Status New     PT SHORT TERM GOAL #2   Title pt will demo decreaed upper trap and surrounding muscle tightness to reduce pain and improve cervical mobility (06/15/2017)   Time 3   Period Weeks   Status New           PT Long Term Goals - 05/25/17 1753      PT LONG TERM GOAL #1   Title pt will increase increase cervical flexion/ extension by >/= 10 degrees with </= 2/10 pain for functional mobility required for ADLs ((07/06/2017)   Time 6   Period Weeks   Status New     PT LONG TERM GOAL #2   Title pt  will be able to perform trunk motions WNL with </= 2/10 no report of tightness or referral of pain to improve QOL ((07/06/2017))   Time 6   Period Weeks   Status New     PT LONG TERM GOAL #3   Title increase FOTO score to </= 27% limited to demo improvement in  function    Time 6   Period Weeks   Status New     PT LONG TERM GOAL #4   Title pt will be </= Min A with all HEP given as of last visit (07/06/2017)               Plan - 06/09/17 1218    Clinical Impression Statement Patient tolerated treatment well. He reported pain at times with activity but he reported no continued pain after the activity stopped. He was able to complete all exercises. he had spasming of the left upper trap.    History and Personal Factors relevant to plan of care: hx of down syndorme, communication problems, dysphagia    Clinical Presentation Unstable   Clinical Presentation due to: fluctuating pain    Clinical Decision Making High   Rehab Potential Good   PT Frequency 2x / week   PT Duration 6 weeks   PT Treatment/Interventions ADLs/Self Care Home Management;Cryotherapy;Electrical Stimulation;Iontophoresis 4mg /ml Dexamethasone;Moist Heat;Traction;Ultrasound;Therapeutic activities;Therapeutic exercise;Dry needling;Taping;Manual techniques;Patient/family education;Gait training   PT Next Visit Plan assess/ review HEP and update PRN, manual for upper traps/ levator scapulae and L thoracolumbar paraspinals, posture correction exercise, modalities PRN   PT Home Exercise Plan LTR, hamstring stretching, chin tuck, upper trap stretch, pelvic tilt,    Consulted and Agree with Plan of Care Patient      Patient will benefit from skilled therapeutic intervention in order to improve the following deficits and impairments:  Pain, Postural dysfunction, Improper body mechanics, Decreased range of motion, Decreased endurance, Increased fascial restricitons, Decreased activity tolerance  Visit  Diagnosis: Cervicalgia  Chronic left-sided low back pain, with sciatica presence unspecified  Muscle spasm of back  Abnormal posture     Problem List Patient Active Problem List   Diagnosis Date Noted  . Vitamin D deficiency 10/04/2015  . OSA (obstructive sleep apnea) 10/03/2015  . Hearing loss 10/03/2015  . Unspecified constipation 10/07/2013  . Achalasia 11/08/2012  . Esophageal diverticulum 05/12/2012  . Onychomycosis 05/12/2012  . Candida esophagitis (HCC) 05/03/2012  . PNA (pneumonia) 05/01/2012  . Down's syndrome 04/29/2012  . Hypothyroidism 04/29/2012    Dessie Coma PT DPT  06/09/2017, 12:45 PM  [facility] [address] [place], [place], [address] Phone: [phone]   Fax:  [phone]  Name: Ronald Le MRN: [id number] Date of Birth: [birth year]

## 2017-06-09 NOTE — ED Notes (Signed)
Laceration on patient's right hand cleaned with Sure Clens spray.

## 2017-06-09 NOTE — ED Provider Notes (Signed)
CSN: [id number]     Arrival date & time 06/09/17  1931 History   First MD Initiated Contact with Patient 06/09/17 2036     Chief Complaint  Patient presents with  . Laceration   (Consider location/radiation/quality/duration/timing/severity/associated sxs/prior Treatment) 26 year old male accidentally cut his right hand over the palmar thenar eminence approximately 2 hours prior to arrival with a knife. It is quite superficial. He demonstrates full ability to oppose the thumb and full range of motion. No involvement of the finger or joints. He is up-to-date on immunizations.      Past Medical History:  Diagnosis Date  . Allergy   . Candida esophagitis (HCC) 04/2012   after ABX for pneumonia  . Chronic serous otitis media   . Communication problem    "when he feels bad he just communicates w/thumbs up or down or shakes his head; very limited communication normally" (09/30/2012)  . Complication of anesthesia 08/2012   "hard to wake up after balloon dilatation)  . Complication of anesthesia 1999   "respiratory arrest after hip OR; had to get Narcan" (09/30/2012)  . Down's syndrome   . Dysphagia 09/30/2012   now resolved  . Finger fracture 2009   h/o  . GERD (gastroesophageal reflux disease)    "maybe" (09/30/2012)  . Heart murmur    "@ birth; resolved" (09/30/2012)  . Hypothyroidism   . Legg-Calve-Perthes disease   . OSA on CPAP    noncompliant with use of CPAP  . Peristalsis    "almost absent" (09/30/2012)  . Pneumonia    multiple hospitalizatons as toddler, but 10 day stay age 46yo  . Viral syndrome 09/1995   hospital viral syndrome.   Past Surgical History:  Procedure Laterality Date  . ADENOIDECTOMY AND MYRINGOTOMY WITH TUBE PLACEMENT  11/1996   bilaterally  . BALLOON DILATION  09/01/2012   Procedure: BALLOON DILATION;  Surgeon: Willis Modena, MD;  Location: [facility];  Service: Endoscopy;  Laterality: N/A;  . ESOPHAGOGASTRODUODENOSCOPY  10/01/2012   Procedure:  ESOPHAGOGASTRODUODENOSCOPY (EGD);  Surgeon: Vertell Novak., MD;  Location: [facility] ENDOSCOPY;  Service: Endoscopy;  Laterality: N/A;  . ESOPHAGOGASTRODUODENOSCOPY  10/01/12  . ESOPHAGOGASTRODUODENOSCOPY (EGD) WITH PROPOFOL N/A 12/21/2013   Procedure: ESOPHAGOGASTRODUODENOSCOPY (EGD) WITH PROPOFOL;  Surgeon: Willis Modena, MD;  Location: [facility];  Service: Endoscopy;  Laterality: N/A;  . GUM SURGERY  ~ 2004   graft to gums from roof of mouth.  Marland Kitchen HIP ARTHROPLASTY  10/1998   left; due to Legg-Calve-Perthe dz and subsequent surgery to remove hardware  . LAPAROSCOPIC HELLER MYOTOMY  11/11/12   Dr.Farrell  . laparoscopic surgery  11-16-13   . TYMPANOSTOMY TUBE PLACEMENT  11/1996   bilaterally   Family History  Problem Relation Age of Onset  . Irritable bowel syndrome Mother   . Interstitial cystitis Mother   . Fibromyalgia Mother   . Gallbladder disease Father        gallbladder surgery  . Migraines Sister   . Depression Sister        postpartum  . Alzheimer's disease Maternal Grandmother        dementia.  . Pyloric stenosis Brother        as baby  . Hearing loss Maternal Grandfather        cochlear loss   Social History  Substance Use Topics  . Smoking status: Never Smoker  . Smokeless tobacco: Never Used  . Alcohol use No    Review of Systems  Skin: Positive for wound.  All  other systems reviewed and are negative.   Allergies  Patient has no known allergies.  Home Medications   Prior to Admission medications   Medication Sig Start Date End Date Taking? Authorizing Provider  levothyroxine (SYNTHROID) 100 MCG tablet Take 100 mcg by mouth daily before breakfast.   Yes [provider]  omeprazole (PRILOSEC) 20 MG capsule Take 20 mg by mouth daily.   Yes [provider]   Meds Ordered and Administered this Visit  Medications - No data to display  BP 110/69 (BP Location: Left Arm)   Pulse 68   Temp 98 F (36.7 C) (Oral)   Resp 18   SpO2 100%   No data found.   Physical Exam  Constitutional: He appears well-developed and well-nourished.  Neurological: He is alert.  Skin: Skin is warm and dry.  Once a laceration to the right palm over the thenar eminence. The depth involves the dermis only. Demonstrates full function of the hand, palm and thumb. No foreign bodies.  Psychiatric: He has a normal mood and affect.  Nursing note and vitals reviewed.   Urgent Care Course     .Marland Kitchen.Laceration Repair Date/Time: 06/09/2017 9:02 PM Performed by: Phineas RealMABE, Dawaun Brancato Authorized by: Eustace MooreMURRAY, LAURA W   Consent:    Consent obtained:  Verbal   Consent given by:  Parent and patient   Risks discussed:  Infection and pain Anesthesia (see MAR for exact dosages):    Anesthesia method:  Local infiltration   Local anesthetic:  Lidocaine 2% WITH epi Laceration details:    Location:  Hand   Hand location:  R palm   Length (cm):  1   Depth (mm):  2 Repair type:    Repair type:  Simple Exploration:    Hemostasis achieved with:  Direct pressure   Wound exploration: entire depth of wound probed and visualized     Wound extent: no foreign bodies/material noted, no muscle damage noted, no nerve damage noted and no tendon damage noted     Contaminated: no   Treatment:    Area cleansed with:  Shur-Clens   Amount of cleaning:  Standard   Irrigation method:  Pressure wash   Visualized foreign bodies/material removed: no   Skin repair:    Repair method:  Sutures   Suture size:  3-0   Suture material:  Nylon Approximation:    Approximation:  Close   Vermilion border: well-aligned   Post-procedure details:    Dressing:  Sterile dressing   Patient tolerance of procedure:  Tolerated well, no immediate complications   (including critical care time)  Labs Review Labs Reviewed - No data to display  Imaging Review No results found.   Visual Acuity Review  Right Eye Distance:   Left Eye Distance:   Bilateral Distance:    Right Eye Near:   Left  Eye Near:    Bilateral Near:         MDM   1. Laceration of right hand without foreign body, initial encounter     Keep clean and dry, watch for any signs of infection, pus, swelling, redness, increased tenderness. Sutures out in about 7-8 days. For any problems may return Especially for signs of infection.    Hayden RasmussenMabe, Alonzo Owczarzak, NP 06/09/17 2104

## 2017-06-10 ENCOUNTER — Ambulatory Visit: Payer: Medicare Other | Admitting: Physical Therapy

## 2017-06-10 DIAGNOSIS — M62830 Muscle spasm of back: Secondary | ICD-10-CM

## 2017-06-10 DIAGNOSIS — G8929 Other chronic pain: Secondary | ICD-10-CM

## 2017-06-10 DIAGNOSIS — M542 Cervicalgia: Secondary | ICD-10-CM | POA: Diagnosis not present

## 2017-06-10 DIAGNOSIS — M545 Low back pain: Secondary | ICD-10-CM

## 2017-06-10 DIAGNOSIS — R293 Abnormal posture: Secondary | ICD-10-CM | POA: Diagnosis not present

## 2017-06-11 ENCOUNTER — Encounter: Payer: Self-pay | Admitting: Physical Therapy

## 2017-06-11 NOTE — Therapy (Signed)
[facility] [address] [place], [place], [address] Phone: [phone]   Fax:  [phone]  Physical Therapy Treatment  Patient Details  Name: Ronald Le MRN: [id number] Date of Birth: 12-Oct-1991 Referring Provider: Joselyn Arrow, MD  Encounter Date: 06/10/2017      PT End of Session - 06/11/17 1220    Visit Number 3   Number of Visits 13   Date for PT Re-Evaluation 07/06/17   Authorization Type MCR: KX mod by 15th visit, Progress note by 10th or 8/2/   PT Start Time 1633   PT Stop Time 1716   PT Time Calculation (min) 43 min   Activity Tolerance Patient tolerated treatment well   Behavior During Therapy [facility] for tasks assessed/performed      Past Medical History:  Diagnosis Date  . Allergy   . Candida esophagitis (HCC) 04/2012   after ABX for pneumonia  . Chronic serous otitis media   . Communication problem    "when he feels bad he just communicates w/thumbs up or down or shakes his head; very limited communication normally" (09/30/2012)  . Complication of anesthesia 08/2012   "hard to wake up after balloon dilatation)  . Complication of anesthesia 1999   "respiratory arrest after hip OR; had to get Narcan" (09/30/2012)  . Down's syndrome   . Dysphagia 09/30/2012   now resolved  . Finger fracture 2009   h/o  . GERD (gastroesophageal reflux disease)    "maybe" (09/30/2012)  . Heart murmur    "@ birth; resolved" (09/30/2012)  . Hypothyroidism   . Legg-Calve-Perthes disease   . OSA on CPAP    noncompliant with use of CPAP  . Peristalsis    "almost absent" (09/30/2012)  . Pneumonia    multiple hospitalizatons as toddler, but 10 day stay age 35yo  . Viral syndrome 09/1995   hospital viral syndrome.    Past Surgical History:  Procedure Laterality Date  . ADENOIDECTOMY AND MYRINGOTOMY WITH TUBE PLACEMENT  11/1996   bilaterally  . BALLOON DILATION  09/01/2012   Procedure: BALLOON DILATION;  Surgeon: Willis Modena, MD;  Location: [facility];  Service: Endoscopy;  Laterality: N/A;  . ESOPHAGOGASTRODUODENOSCOPY  10/01/2012   Procedure: ESOPHAGOGASTRODUODENOSCOPY (EGD);  Surgeon: Vertell Novak., MD;  Location: [facility] ENDOSCOPY;  Service: Endoscopy;  Laterality: N/A;  . ESOPHAGOGASTRODUODENOSCOPY  10/01/12  . ESOPHAGOGASTRODUODENOSCOPY (EGD) WITH PROPOFOL N/A 12/21/2013   Procedure: ESOPHAGOGASTRODUODENOSCOPY (EGD) WITH PROPOFOL;  Surgeon: Willis Modena, MD;  Location: [facility];  Service: Endoscopy;  Laterality: N/A;  . GUM SURGERY  ~ 2004   graft to gums from roof of mouth.  Marland Kitchen HIP ARTHROPLASTY  10/1998   left; due to Legg-Calve-Perthe dz and subsequent surgery to remove hardware  . LAPAROSCOPIC HELLER MYOTOMY  11/11/12   Dr.Farrell  . laparoscopic surgery  11-16-13   . TYMPANOSTOMY TUBE PLACEMENT  11/1996   bilaterally    There were no vitals filed for this visit.      Subjective Assessment - 06/11/17 1211    Subjective pATIENT CUT HIS RIGHT HAND AND HAD TO HAVE 2 STITCHES. pER MOTHER HE WILL GET THEM OUT NEXT WEEK. His mother reports no signifcant increase in pain after last session and he liked doing the exercises.    Limitations Lifting   How long can you sit comfortably? 15 min   How long can you stand comfortably? 60 min   How long can you walk comfortably? 60 min   Diagnostic tests  [facility] [address] [place], [place], [address] Phone: [phone]   Fax:  [phone]  Physical Therapy Treatment  Patient Details  Name: Ronald Le MRN: [id number] Date of Birth: 12-Oct-1991 Referring Provider: Joselyn Arrow, MD  Encounter Date: 06/10/2017      PT End of Session - 06/11/17 1220    Visit Number 3   Number of Visits 13   Date for PT Re-Evaluation 07/06/17   Authorization Type MCR: KX mod by 15th visit, Progress note by 10th or 8/2/   PT Start Time 1633   PT Stop Time 1716   PT Time Calculation (min) 43 min   Activity Tolerance Patient tolerated treatment well   Behavior During Therapy [facility] for tasks assessed/performed      Past Medical History:  Diagnosis Date  . Allergy   . Candida esophagitis (HCC) 04/2012   after ABX for pneumonia  . Chronic serous otitis media   . Communication problem    "when he feels bad he just communicates w/thumbs up or down or shakes his head; very limited communication normally" (09/30/2012)  . Complication of anesthesia 08/2012   "hard to wake up after balloon dilatation)  . Complication of anesthesia 1999   "respiratory arrest after hip OR; had to get Narcan" (09/30/2012)  . Down's syndrome   . Dysphagia 09/30/2012   now resolved  . Finger fracture 2009   h/o  . GERD (gastroesophageal reflux disease)    "maybe" (09/30/2012)  . Heart murmur    "@ birth; resolved" (09/30/2012)  . Hypothyroidism   . Legg-Calve-Perthes disease   . OSA on CPAP    noncompliant with use of CPAP  . Peristalsis    "almost absent" (09/30/2012)  . Pneumonia    multiple hospitalizatons as toddler, but 10 day stay age 35yo  . Viral syndrome 09/1995   hospital viral syndrome.    Past Surgical History:  Procedure Laterality Date  . ADENOIDECTOMY AND MYRINGOTOMY WITH TUBE PLACEMENT  11/1996   bilaterally  . BALLOON DILATION  09/01/2012   Procedure: BALLOON DILATION;  Surgeon: Willis Modena, MD;  Location: [facility];  Service: Endoscopy;  Laterality: N/A;  . ESOPHAGOGASTRODUODENOSCOPY  10/01/2012   Procedure: ESOPHAGOGASTRODUODENOSCOPY (EGD);  Surgeon: Vertell Novak., MD;  Location: [facility] ENDOSCOPY;  Service: Endoscopy;  Laterality: N/A;  . ESOPHAGOGASTRODUODENOSCOPY  10/01/12  . ESOPHAGOGASTRODUODENOSCOPY (EGD) WITH PROPOFOL N/A 12/21/2013   Procedure: ESOPHAGOGASTRODUODENOSCOPY (EGD) WITH PROPOFOL;  Surgeon: Willis Modena, MD;  Location: [facility];  Service: Endoscopy;  Laterality: N/A;  . GUM SURGERY  ~ 2004   graft to gums from roof of mouth.  Marland Kitchen HIP ARTHROPLASTY  10/1998   left; due to Legg-Calve-Perthe dz and subsequent surgery to remove hardware  . LAPAROSCOPIC HELLER MYOTOMY  11/11/12   Dr.Farrell  . laparoscopic surgery  11-16-13   . TYMPANOSTOMY TUBE PLACEMENT  11/1996   bilaterally    There were no vitals filed for this visit.      Subjective Assessment - 06/11/17 1211    Subjective pATIENT CUT HIS RIGHT HAND AND HAD TO HAVE 2 STITCHES. pER MOTHER HE WILL GET THEM OUT NEXT WEEK. His mother reports no signifcant increase in pain after last session and he liked doing the exercises.    Limitations Lifting   How long can you sit comfortably? 15 min   How long can you stand comfortably? 60 min   How long can you walk comfortably? 60 min   Diagnostic tests  report of tightness or referral of pain to improve QOL ((07/06/2017))   Time 6   Period Weeks   Status New     PT LONG TERM GOAL #3   Title increase FOTO score to </= 27% limited to demo improvement in function    Time 6   Period Weeks   Status New     PT LONG TERM GOAL #4   Title pt will be </= Min A with all HEP given as of last visit (07/06/2017)               Plan - 06/11/17 1410    Clinical Impression Statement Patient was limited today by his hand. He reported hand pain with all exercises. He was able to complete lower extremity strengthening with mod A . No new golas reached. Continue with POC.    History and Personal Factors relevant to plan of care: hx of down syndrome, communication problems, dysphagia    Clinical Presentation Unstable   Clinical Presentation due to: fluctuating pain    Clinical Decision Making High   Rehab Potential Good   PT Frequency 2x / week   PT Duration 6 weeks   PT Treatment/Interventions ADLs/Self Care Home Management;Cryotherapy;Electrical Stimulation;Iontophoresis 4mg /ml Dexamethasone;Moist Heat;Traction;Ultrasound;Therapeutic activities;Therapeutic exercise;Dry needling;Taping;Manual techniques;Patient/family education;Gait training   PT Next Visit Plan assess/ review HEP and update PRN, manual for upper traps/ levator scapulae and L thoracolumbar paraspinals, posture correction exercise, modalities PRN   PT Home Exercise Plan LTR, hamstring stretching, chin tuck, upper trap stretch, pelvic tilt,    Consulted and Agree with Plan of Care Patient      Patient will benefit from skilled therapeutic intervention in order to improve the following deficits and  impairments:  Pain, Postural dysfunction, Improper body mechanics, Decreased range of motion, Decreased endurance, Increased fascial restricitons, Decreased activity tolerance  Visit Diagnosis: Cervicalgia  Chronic left-sided low back pain, with sciatica presence unspecified  Muscle spasm of back     Problem List Patient Active Problem List   Diagnosis Date Noted  . Vitamin D deficiency 10/04/2015  . OSA (obstructive sleep apnea) 10/03/2015  . Hearing loss 10/03/2015  . Unspecified constipation 10/07/2013  . Achalasia 11/08/2012  . Esophageal diverticulum 05/12/2012  . Onychomycosis 05/12/2012  . Candida esophagitis (HCC) 05/03/2012  . PNA (pneumonia) 05/01/2012  . Down's syndrome 04/29/2012  . Hypothyroidism 04/29/2012    Dessie Comaavid J Briseyda Fehr PT DPT  06/11/2017, 2:12 PM  [facility] [address] [place], [place], [id number] Phone: [phone]   Fax:  [phone]  Name: Felicity CoyerDalton M Cervini MRN: [id number] Date of Birth: 07-Mar-1991

## 2017-06-15 ENCOUNTER — Ambulatory Visit: Payer: Medicare Other | Admitting: Physical Therapy

## 2017-06-15 DIAGNOSIS — M62830 Muscle spasm of back: Secondary | ICD-10-CM | POA: Diagnosis not present

## 2017-06-15 DIAGNOSIS — M545 Low back pain: Secondary | ICD-10-CM

## 2017-06-15 DIAGNOSIS — R293 Abnormal posture: Secondary | ICD-10-CM | POA: Diagnosis not present

## 2017-06-15 DIAGNOSIS — M542 Cervicalgia: Secondary | ICD-10-CM | POA: Diagnosis not present

## 2017-06-15 DIAGNOSIS — G8929 Other chronic pain: Secondary | ICD-10-CM

## 2017-06-16 ENCOUNTER — Encounter: Payer: Self-pay | Admitting: Physical Therapy

## 2017-06-16 NOTE — Therapy (Signed)
[facility] [address] [place], [place], [address] Phone: [phone]   Fax:  [phone]  Physical Therapy Treatment  Patient Details  Name: Ronald CoyerDalton M Gowdy MRN: [id number] Date of Birth: 04/03/1991 Referring Provider: Joselyn ArrowKnapp, Eve, MD  Encounter Date: 06/15/2017      PT End of Session - 06/16/17 0938    Visit Number 4   Number of Visits 13   Date for PT Re-Evaluation 07/06/17   Authorization Type MCR: KX mod by 15th visit, Progress note by 10th or 8/2/   Activity Tolerance Patient tolerated treatment well   Behavior During Therapy [facility] for tasks assessed/performed      Past Medical History:  Diagnosis Date  . Allergy   . Candida esophagitis (HCC) 04/2012   after ABX for pneumonia  . Chronic serous otitis media   . Communication problem    "when he feels bad he just communicates w/thumbs up or down or shakes his head; very limited communication normally" (09/30/2012)  . Complication of anesthesia 08/2012   "hard to wake up after balloon dilatation)  . Complication of anesthesia 1999   "respiratory arrest after hip OR; had to get Narcan" (09/30/2012)  . Down's syndrome   . Dysphagia 09/30/2012   now resolved  . Finger fracture 2009   h/o  . GERD (gastroesophageal reflux disease)    "maybe" (09/30/2012)  . Heart murmur    "@ birth; resolved" (09/30/2012)  . Hypothyroidism   . Legg-Calve-Perthes disease   . OSA on CPAP    noncompliant with use of CPAP  . Peristalsis    "almost absent" (09/30/2012)  . Pneumonia    multiple hospitalizatons as toddler, but 10 day stay age 26yo  . Viral syndrome 09/1995   hospital viral syndrome.    Past Surgical History:  Procedure Laterality Date  . ADENOIDECTOMY AND MYRINGOTOMY WITH TUBE PLACEMENT  11/1996   bilaterally  . BALLOON DILATION  09/01/2012   Procedure: BALLOON DILATION;  Surgeon: Willis ModenaWilliam Outlaw, MD;  Location: [facility];  Service: Endoscopy;  Laterality: N/A;  .  ESOPHAGOGASTRODUODENOSCOPY  10/01/2012   Procedure: ESOPHAGOGASTRODUODENOSCOPY (EGD);  Surgeon: Vertell NovakJames L Edwards Jr., MD;  [facility];  Service: Endoscopy;  Laterality: N/A;  . ESOPHAGOGASTRODUODENOSCOPY  10/01/12  . ESOPHAGOGASTRODUODENOSCOPY (EGD) WITH PROPOFOL N/A 12/21/2013   Procedure: ESOPHAGOGASTRODUODENOSCOPY (EGD) WITH PROPOFOL;  Surgeon: Willis ModenaWilliam Outlaw, MD;  Location: [facility];  Service: Endoscopy;  Laterality: N/A;  . GUM SURGERY  ~ 2004   graft to gums from roof of mouth.  Marland Kitchen. HIP ARTHROPLASTY  10/1998   left; due to Legg-Calve-Perthe dz and subsequent surgery to remove hardware  . LAPAROSCOPIC HELLER MYOTOMY  11/11/12   Dr.Farrell  . laparoscopic surgery  11-16-13   . TYMPANOSTOMY TUBE PLACEMENT  11/1996   bilaterally    There were no vitals filed for this visit.      Subjective Assessment - 06/16/17 0934    Subjective Patient continues to report pain in his hand. He reports some soreness in his back as well.    Pertinent History hx of down syndrome   Limitations Lifting   How long can you sit comfortably? 15 min   How long can you stand comfortably? 60 min   How long can you walk comfortably? 60 min   Diagnostic tests x-ray for neck   Patient Stated Goals get legs strongewr   Currently in Pain? Yes   Pain Score 2   faces scale    Pain Location Hand  Pain Orientation Right   Pain Descriptors / Indicators Aching   Pain Type Chronic pain   Pain Onset More than a month ago   Pain Frequency Intermittent   Aggravating Factors  looking up    Pain Relieving Factors resting    Multiple Pain Sites No   Pain Location Back   Pain Orientation Left   Pain Descriptors / Indicators Sore   Pain Type Chronic pain   Pain Onset More than a month ago   Pain Frequency Intermittent   Aggravating Factors  leaning back and leaning forward    Pain Relieving Factors resting and healing                          [facility] Adult PT Treatment/Exercise -  06/16/17 0001      Neck Exercises: Standing   Other Standing Exercises scap retraction 2x10 yellow; shoulder extension 2x10 yellow; moderate cuing for each; some difficulty with right hand 2nd to sitches.   Other Standing Exercises standing shoulder flexion with wand 2x10      Neck Exercises: Supine   Neck Retraction 10 reps;5 secs  moderate cuing      Lumbar Exercises: Stretches   Active Hamstring Stretch 2 reps;30 seconds   Lower Trunk Rotation Limitations 2x10   Pelvic Tilt Limitations 1x10 tactile cues on proper form     Lumbar Exercises: Standing   Other Standing Lumbar Exercises heel raise 2x10 on air-ex; mini squats on air-ex 2x10   Other Standing Lumbar Exercises standing 3way hip 2x10 with mod cuing; reobounder with soft ball     Lumbar Exercises: Supine   Other Supine Lumbar Exercises supine ball squeeze 2x10; supine clam shell 2x10 yellow;      Manual Therapy   Manual Therapy Soft tissue mobilization;Passive ROM   Soft tissue mobilization to cervical paraspinals and upper traps to reduce muslce spasming and pain/    Passive ROM PROM of the hips into flexion ER and IR                 PT Education - 06/16/17 0938    Education provided Yes   Education Details reviewed technique with exercises   Person(s) Educated Patient;Parent(s)   Methods Explanation;Demonstration;Tactile cues;Verbal cues   Comprehension Verbalized understanding;Returned demonstration;Verbal cues required;Tactile cues required          PT Short Term Goals - 06/11/17 1411      PT SHORT TERM GOAL #1   Title pt to be Min A with initial HEP (06/15/2017)   Baseline continues to require cuing    Time 3   Period Weeks   Status On-going     PT SHORT TERM GOAL #2   Title pt will demo decreaed upper trap and surrounding muscle tightness to reduce pain and improve cervical mobility (06/15/2017)   Baseline right upper trap spasm less    Time 3   Period Weeks   Status On-going            PT Long Term Goals - 05/25/17 1753      PT LONG TERM GOAL #1   Title pt will increase increase cervical flexion/ extension by >/= 10 degrees with </= 2/10 pain for functional mobility required for ADLs ((07/06/2017)   Time 6   Period Weeks   Status New     PT LONG TERM GOAL #2   Title pt will be able to perform trunk motions WNL with </= 2/10 no report  of tightness or referral of pain to improve QOL ((07/06/2017))   Time 6   Period Weeks   Status New     PT LONG TERM GOAL #3   Title increase FOTO score to </= 27% limited to demo improvement in function    Time 6   Period Weeks   Status New     PT LONG TERM GOAL #4   Title pt will be </= Min A with all HEP given as of last visit (07/06/2017)               Plan - 06/16/17 0939    Clinical Impression Statement Patient tolerated treatment well. He had less deiffiuclty using his hand. he needs some cuing to stay on tasks but otherwise does really well with his exercises.    History and Personal Factors relevant to plan of care: downs syndrome    Clinical Presentation Unstable   Clinical Presentation due to: fluctuating pain    Clinical Decision Making High   Rehab Potential Good   PT Frequency 2x / week   PT Duration 8 weeks   PT Treatment/Interventions ADLs/Self Care Home Management;Cryotherapy;Electrical Stimulation;Iontophoresis 4mg /ml Dexamethasone;Moist Heat;Traction;Ultrasound;Therapeutic activities;Therapeutic exercise;Dry needling;Taping;Manual techniques;Patient/family education;Gait training   PT Next Visit Plan assess/ review HEP and update PRN, manual for upper traps/ levator scapulae and L thoracolumbar paraspinals, posture correction exercise, modalities PRN   PT Home Exercise Plan LTR, hamstring stretching, chin tuck, upper trap stretch, pelvic tilt,    Consulted and Agree with Plan of Care Patient      Patient will benefit from skilled therapeutic intervention in order to improve the following deficits and  impairments:  Pain, Postural dysfunction, Improper body mechanics, Decreased range of motion, Decreased endurance, Increased fascial restricitons, Decreased activity tolerance  Visit Diagnosis: Cervicalgia  Chronic left-sided low back pain, with sciatica presence unspecified  Muscle spasm of back  Abnormal posture     Problem List Patient Active Problem List   Diagnosis Date Noted  . Vitamin D deficiency 10/04/2015  . OSA (obstructive sleep apnea) 10/03/2015  . Hearing loss 10/03/2015  . Unspecified constipation 10/07/2013  . Achalasia 11/08/2012  . Esophageal diverticulum 05/12/2012  . Onychomycosis 05/12/2012  . Candida esophagitis (HCC) 05/03/2012  . PNA (pneumonia) 05/01/2012  . Down's syndrome 04/29/2012  . Hypothyroidism 04/29/2012    Dessie Coma PT DPT  06/16/2017, 9:45 AM  [facility] [address] [place], [place], [address] Phone: [phone]   Fax:  [phone]  Name: LION FERNANDEZ MRN: [id number] Date of Birth: 1991-05-03

## 2017-06-17 ENCOUNTER — Encounter (HOSPITAL_COMMUNITY): Payer: Self-pay

## 2017-06-17 ENCOUNTER — Encounter: Payer: Self-pay | Admitting: Physical Therapy

## 2017-06-17 ENCOUNTER — Ambulatory Visit: Payer: Medicare Other | Admitting: Physical Therapy

## 2017-06-17 ENCOUNTER — Ambulatory Visit (HOSPITAL_COMMUNITY)
Admission: EM | Admit: 2017-06-17 | Discharge: 2017-06-17 | Disposition: A | Discharge status: Home / Self Care | Payer: Medicare Other | Attending: Family Medicine | Admitting: Family Medicine

## 2017-06-17 DIAGNOSIS — Z4802 Encounter for removal of sutures: Secondary | ICD-10-CM | POA: Diagnosis not present

## 2017-06-17 DIAGNOSIS — R293 Abnormal posture: Secondary | ICD-10-CM

## 2017-06-17 DIAGNOSIS — M62830 Muscle spasm of back: Secondary | ICD-10-CM | POA: Diagnosis not present

## 2017-06-17 DIAGNOSIS — M545 Low back pain: Secondary | ICD-10-CM

## 2017-06-17 DIAGNOSIS — G8929 Other chronic pain: Secondary | ICD-10-CM

## 2017-06-17 DIAGNOSIS — M542 Cervicalgia: Secondary | ICD-10-CM

## 2017-06-17 NOTE — ED Triage Notes (Addendum)
[facility] near thumb

## 2017-06-17 NOTE — Therapy (Signed)
[facility] [address] [place], [place], [address] Phone: [phone]   Fax:  [phone]  Physical Therapy Treatment  Patient Details  Name: Ronald Le MRN: [id number] Date of Birth: 07/24/1991 Referring Provider: Joselyn ArrowKnapp, Eve, MD  Encounter Date: 06/17/2017      PT End of Session - 06/17/17 1726    Visit Number 5   Number of Visits 13   Date for PT Re-Evaluation 07/06/17   Authorization Type MCR: KX mod by 15th visit, Progress note by 10th or 8/2/   PT Start Time 1545   PT Stop Time 1630   PT Time Calculation (min) 45 min   Activity Tolerance Patient tolerated treatment well   Behavior During Therapy [facility] for tasks assessed/performed      Past Medical History:  Diagnosis Date  . Allergy   . Candida esophagitis (HCC) 04/2012   after ABX for pneumonia  . Chronic serous otitis media   . Communication problem    "when he feels bad he just communicates w/thumbs up or down or shakes his head; very limited communication normally" (09/30/2012)  . Complication of anesthesia 08/2012   "hard to wake up after balloon dilatation)  . Complication of anesthesia 1999   "respiratory arrest after hip OR; had to get Narcan" (09/30/2012)  . Down's syndrome   . Dysphagia 09/30/2012   now resolved  . Finger fracture 2009   h/o  . GERD (gastroesophageal reflux disease)    "maybe" (09/30/2012)  . Heart murmur    "@ birth; resolved" (09/30/2012)  . Hypothyroidism   . Legg-Calve-Perthes disease   . OSA on CPAP    noncompliant with use of CPAP  . Peristalsis    "almost absent" (09/30/2012)  . Pneumonia    multiple hospitalizatons as toddler, but 10 day stay age 26yo  . Viral syndrome 09/1995   hospital viral syndrome.    Past Surgical History:  Procedure Laterality Date  . ADENOIDECTOMY AND MYRINGOTOMY WITH TUBE PLACEMENT  11/1996   bilaterally  . BALLOON DILATION  09/01/2012   Procedure: BALLOON DILATION;  Surgeon: Willis ModenaWilliam  Outlaw, MD;  Location: [facility];  Service: Endoscopy;  Laterality: N/A;  . ESOPHAGOGASTRODUODENOSCOPY  10/01/2012   Procedure: ESOPHAGOGASTRODUODENOSCOPY (EGD);  Surgeon: Vertell NovakJames L Edwards Jr., MD;  [facility];  Service: Endoscopy;  Laterality: N/A;  . ESOPHAGOGASTRODUODENOSCOPY  10/01/12  . ESOPHAGOGASTRODUODENOSCOPY (EGD) WITH PROPOFOL N/A 12/21/2013   Procedure: ESOPHAGOGASTRODUODENOSCOPY (EGD) WITH PROPOFOL;  Surgeon: Willis ModenaWilliam Outlaw, MD;  Location: [facility];  Service: Endoscopy;  Laterality: N/A;  . GUM SURGERY  ~ 2004   graft to gums from roof of mouth.  Marland Kitchen. HIP ARTHROPLASTY  10/1998   left; due to Legg-Calve-Perthe dz and subsequent surgery to remove hardware  . LAPAROSCOPIC HELLER MYOTOMY  11/11/12   Dr.Farrell  . laparoscopic surgery  11-16-13   . TYMPANOSTOMY TUBE PLACEMENT  11/1996   bilaterally    There were no vitals filed for this visit.      Subjective Assessment - 06/17/17 1546    Subjective pt reports pain in the hand, continued pain in the neck / back as " pretty bad"    Currently in Pain? Yes   Pain Score 8    Pain Location Neck   Pain Orientation Right   Pain Descriptors / Indicators Aching   Pain Type Chronic pain   Pain Onset More than a month ago   Pain Frequency Intermittent   Aggravating Factors  looking up  Pain Score 0  using wong baker scale   Pain Location Back   Pain Type Chronic pain   Pain Onset More than a month ago   Pain Frequency Intermittent                         [facility] Adult PT Treatment/Exercise - 06/17/17 1729      Neck Exercises: Seated   Other Seated Exercise seated thoracic rotation hugging bolster 2 x 10      Neck Exercises: Supine   Neck Retraction 10 reps;5 secs   Other Supine Exercise ceiling punches/bench press 2 x 25  3# on dowel rodusing bil UE    Other Supine Exercise horizontal shoulder abduction 2 x 15 with yellow therband.  PNF 2 x 10 D2 with yellow theraband  modified for his hand      Manual Therapy   Soft tissue mobilization to bil scalenes/ upper trap and SCM     Neck Exercises: Stretches   Upper Trapezius Stretch 2 reps;30 seconds                PT Education - 06/16/17 0938    Education provided Yes   Education Details reviewed technique with exercises   Person(s) Educated Patient;Parent(s)   Methods Explanation;Demonstration;Tactile cues;Verbal cues   Comprehension Verbalized understanding;Returned demonstration;Verbal cues required;Tactile cues required          PT Short Term Goals - 06/11/17 1411      PT SHORT TERM GOAL #1   Title pt to be Min A with initial HEP (06/15/2017)   Baseline continues to require cuing    Time 3   Period Weeks   Status On-going     PT SHORT TERM GOAL #2   Title pt will demo decreaed upper trap and surrounding muscle tightness to reduce pain and improve cervical mobility (06/15/2017)   Baseline right upper trap spasm less    Time 3   Period Weeks   Status On-going           PT Long Term Goals - 05/25/17 1753      PT LONG TERM GOAL #1   Title pt will increase increase cervical flexion/ extension by >/= 10 degrees with </= 2/10 pain for functional mobility required for ADLs ((07/06/2017)   Time 6   Period Weeks   Status New     PT LONG TERM GOAL #2   Title pt will be able to perform trunk motions WNL with </= 2/10 no report of tightness or referral of pain to improve QOL ((07/06/2017))   Time 6   Period Weeks   Status New     PT LONG TERM GOAL #3   Title increase FOTO score to </= 27% limited to demo improvement in function    Time 6   Period Weeks   Status New     PT LONG TERM GOAL #4   Title pt will be </= Min A with all HEP given as of last visit (07/06/2017)               Plan - 06/17/17 1727    Clinical Impression Statement pt reports on pain in the low back. focused on UE strengthening and stretching. He required cues to stay on task and modified band exercises to avoid causing  issues with his hand. pt reported via wong baker scale pain at 6/10 in the neck.    PT Next Visit Plan assess/ review HEP  and update PRN, manual for upper traps/ levator scapulae and L thoracolumbar paraspinals, posture correction exercise, modalities PRN   PT Home Exercise Plan LTR, hamstring stretching, chin tuck, upper trap stretch, pelvic tilt,    Consulted and Agree with Plan of Care Patient      Patient will benefit from skilled therapeutic intervention in order to improve the following deficits and impairments:  Pain, Postural dysfunction, Improper body mechanics, Decreased range of motion, Decreased endurance, Increased fascial restricitons, Decreased activity tolerance  Visit Diagnosis: Cervicalgia  Chronic left-sided low back pain, with sciatica presence unspecified  Abnormal posture  Muscle spasm of back     Problem List Patient Active Problem List   Diagnosis Date Noted  . Vitamin D deficiency 10/04/2015  . OSA (obstructive sleep apnea) 10/03/2015  . Hearing loss 10/03/2015  . Unspecified constipation 10/07/2013  . Achalasia 11/08/2012  . Esophageal diverticulum 05/12/2012  . Onychomycosis 05/12/2012  . Candida esophagitis (HCC) 05/03/2012  . PNA (pneumonia) 05/01/2012  . Down's syndrome 04/29/2012  . Hypothyroidism 04/29/2012   Lulu Riding PT, DPT, LAT, ATC  06/17/17  5:35 PM      [facility] [address] [place], [place], [address] Phone: [phone]   Fax:  [phone]  Name: Ronald Le MRN: [id number] Date of Birth: 27-Dec-1990

## 2017-06-17 NOTE — ED Provider Notes (Signed)
  [facility]   [id number] 06/17/17 Arrival Time: 1652  ASSESSMENT & PLAN:  1. Visit for suture removal    Single suture removed. Slight opening of wound. One steri-strip applied along with bandage. Overall wound looks good. May f/u as needed.  Reviewed expectations re: course of current medical issues. Questions answered. Outlined signs and symptoms indicating need for more acute intervention. Patient verbalized understanding. After Visit Summary given.   SUBJECTIVE:  Ronald Le is a 26 y.o. male who presents for suture removal. Last visit reviewed. No complications. No concerns.  ROS: As per HPI.   OBJECTIVE:  Vitals:   06/17/17 1723  BP: 108/68  Pulse: 68  Resp: 16  Temp: 97.9 F (36.6 C)  TempSrc: Oral  SpO2: 100%     General appearance: alert; no distress Single stitch in palm of hand near thumb.  No Known Allergies  PMHx, SurgHx, SocialHx, Medications, and Allergies were reviewed in the Visit Navigator and updated as appropriate.      Mardella LaymanHagler, Avi Kerschner, MD 06/17/17 [phone]

## 2017-06-17 NOTE — ED Notes (Signed)
nonadherent dressing and coban wrap applied.

## 2017-06-22 ENCOUNTER — Encounter: Payer: Medicare Other | Admitting: Physical Therapy

## 2017-06-22 ENCOUNTER — Telehealth: Payer: Self-pay | Admitting: Physical Therapy

## 2017-06-22 NOTE — Telephone Encounter (Signed)
Attempted to call pt regarding today's missed appointment, but pt's phone was unavailable.

## 2017-06-24 ENCOUNTER — Ambulatory Visit: Payer: Medicare Other | Attending: Family Medicine | Admitting: Physical Therapy

## 2017-06-24 DIAGNOSIS — M545 Low back pain: Secondary | ICD-10-CM | POA: Insufficient documentation

## 2017-06-24 DIAGNOSIS — M542 Cervicalgia: Secondary | ICD-10-CM | POA: Diagnosis not present

## 2017-06-24 DIAGNOSIS — M62830 Muscle spasm of back: Secondary | ICD-10-CM | POA: Diagnosis not present

## 2017-06-24 DIAGNOSIS — G8929 Other chronic pain: Secondary | ICD-10-CM | POA: Diagnosis not present

## 2017-06-24 DIAGNOSIS — R293 Abnormal posture: Secondary | ICD-10-CM | POA: Insufficient documentation

## 2017-06-25 NOTE — Therapy (Signed)
[facility]patient [facility] [address] [place], [place], [address] Phone: [phone]   Fax:  [phone]  Physical Therapy Treatment  Patient Details  Name: Ronald Le MRN: [id number] Date of Birth: 09-26-1991 Referring Provider: Joselyn Arrow, MD  Encounter Date: 06/24/2017      PT End of Session - 06/25/17 0835    Visit Number 6   Number of Visits 13   Date for PT Re-Evaluation 07/06/17   Authorization Type MCR: KX mod by 15th visit, Progress note by 10th or 8/2/   PT Start Time 1625   PT Stop Time 1705   PT Time Calculation (min) 40 min   Activity Tolerance Patient tolerated treatment well   Behavior During Therapy [facility] for tasks assessed/performed      Past Medical History:  Diagnosis Date  . Allergy   . Candida esophagitis (HCC) 04/2012   after ABX for pneumonia  . Chronic serous otitis media   . Communication problem    "when he feels bad he just communicates w/thumbs up or down or shakes his head; very limited communication normally" (09/30/2012)  . Complication of anesthesia 08/2012   "hard to wake up after balloon dilatation)  . Complication of anesthesia 1999   "respiratory arrest after hip OR; had to get Narcan" (09/30/2012)  . Down's syndrome   . Dysphagia 09/30/2012   now resolved  . Finger fracture 2009   h/o  . GERD (gastroesophageal reflux disease)    "maybe" (09/30/2012)  . Heart murmur    "@ birth; resolved" (09/30/2012)  . Hypothyroidism   . Legg-Calve-Perthes disease   . OSA on CPAP    noncompliant with use of CPAP  . Peristalsis    "almost absent" (09/30/2012)  . Pneumonia    multiple hospitalizatons as toddler, but 10 day stay age 52yo  . Viral syndrome 09/1995   hospital viral syndrome.    Past Surgical History:  Procedure Laterality Date  . ADENOIDECTOMY AND MYRINGOTOMY WITH TUBE PLACEMENT  11/1996   bilaterally  . BALLOON DILATION  09/01/2012   Procedure: BALLOON DILATION;  Surgeon: Willis Modena, MD;  Location: [facility]OS[facility];  Service: Endoscopy;  Laterality: N/A;  . ESOPHAGOGASTRODUODENOSCOPY  10/01/2012   Procedure: ESOPHAGOGASTRODUODENOSCOPY (EGD);  Surgeon: Vertell Novak., MD;  Location: [facility] ENDOSCOPY;  Service: Endoscopy;  Laterality: N/A;  . ESOPHAGOGASTRODUODENOSCOPY  10/01/12  . ESOPHAGOGASTRODUODENOSCOPY (EGD) WITH PROPOFOL N/A 12/21/2013   Procedure: ESOPHAGOGASTRODUODENOSCOPY (EGD) WITH PROPOFOL;  Surgeon: Willis Modena, MD;  Location: [facility]OS[facility];  Service: Endoscopy;  Laterality: N/A;  . GUM SURGERY  ~ 2004   graft to gums from roof of mouth.  Marland Kitchen HIP ARTHROPLASTY  10/1998   left; due to Legg-Calve-Perthe dz and subsequent surgery to remove hardware  . LAPAROSCOPIC HELLER MYOTOMY  11/11/12   Dr.Farrell  . laparoscopic surgery  11-16-13   . TYMPANOSTOMY TUBE PLACEMENT  11/1996   bilaterally    There were no vitals filed for this visit.      Subjective Assessment - 06/25/17 0906    Subjective Patient continues to report a sore neck.    Pertinent History hx of down syndrome   Limitations Lifting   How long can you sit comfortably? 15 min   How long can you stand comfortably? 60 min   How long can you walk comfortably? 60 min   Diagnostic tests x-ray for neck   Patient Stated Goals get legs strongewr   Currently in Pain? Yes   Pain Score --  Faces pain scale 4/10    Pain Location Neck   Pain Orientation Right   Pain Descriptors / Indicators Aching   Pain Type Chronic pain   Pain Onset More than a month ago   Pain Frequency Intermittent   Aggravating Factors  looking up    Pain Relieving Factors resting                          [facility] Adult PT Treatment/Exercise - 06/25/17 0001      Neck Exercises: Standing   Other Standing Exercises scap retraction 2x10 yellow; shoulder extension 2x10 yellow; moderate cuing for each; some difficulty with right hand 2nd to sitches.   Other Standing Exercises standing shoulder flexion with wand  2x10      Neck Exercises: Seated   Other Seated Exercise seated thoracic rotation hugging bolster 2 x 10      Neck Exercises: Supine   Neck Retraction 10 reps;5 secs   Other Supine Exercise horizontal shoulder abduction 2 x 15 with yellow therband.      Lumbar Exercises: Stretches   Active Hamstring Stretch 2 reps;30 seconds   Lower Trunk Rotation Limitations 2x10     Lumbar Exercises: Standing   Other Standing Lumbar Exercises heel raise 2x10 on air-ex; mini squats on air-ex 2x10   Other Standing Lumbar Exercises standing 3way hip 2x10 with mod cuing; reobounder with soft ball     Manual Therapy   Soft tissue mobilization to cervical paraspinals and upper traps to reduce muslce spasming and pain/    Passive ROM PROM of the hips into flexion ER and IR                 PT Education - 06/25/17 0908    Education provided Yes   Education Details reviewed texhnique with exercises.    Person(s) Educated Patient;Parent(s)   Methods Explanation;Demonstration;Tactile cues;Verbal cues   Comprehension Verbalized understanding;Returned demonstration;Verbal cues required;Tactile cues required          PT Short Term Goals - 06/25/17 0851      PT SHORT TERM GOAL #1   Title pt to be Min A with initial HEP (06/15/2017)   Baseline continues to require cuing    Time 3   Period Weeks   Status On-going     PT SHORT TERM GOAL #2   Title pt will demo decreaed upper trap and surrounding muscle tightness to reduce pain and improve cervical mobility (06/15/2017)   Baseline right upper trap spasm less    Time 3   Period Weeks   Status On-going           PT Long Term Goals - 05/25/17 1753      PT LONG TERM GOAL #1   Title pt will increase increase cervical flexion/ extension by >/= 10 degrees with </= 2/10 pain for functional mobility required for ADLs ((07/06/2017)   Time 6   Period Weeks   Status New     PT LONG TERM GOAL #2   Title pt will be able to perform trunk motions  WNL with </= 2/10 no report of tightness or referral of pain to improve QOL ((07/06/2017))   Time 6   Period Weeks   Status New     PT LONG TERM GOAL #3   Title increase FOTO score to </= 27% limited to demo improvement in function    Time 6   Period Weeks   Status New  PT LONG TERM GOAL #4   Title pt will be </= Min A with all HEP given as of last visit (07/06/2017)               Plan - 06/25/17 0837    Clinical Impression Statement Patient continues to require mod cuing and mod VC's to stay on tasks. He continues to have pain in his neck with activity. He has mod spasming in his right upper trap. Therapy will continue to progress exercises and weights as tolerated.    Clinical Presentation Unstable   Clinical Decision Making High   Rehab Potential Good   PT Frequency 2x / week   PT Duration 8 weeks   PT Treatment/Interventions ADLs/Self Care Home Management;Cryotherapy;Electrical Stimulation;Iontophoresis 4mg /ml Dexamethasone;Moist Heat;Traction;Ultrasound;Therapeutic activities;Therapeutic exercise;Dry needling;Taping;Manual techniques;Patient/family education;Gait training   PT Next Visit Plan assess/ review HEP and update PRN, manual for upper traps/ levator scapulae and L thoracolumbar paraspinals, posture correction exercise, modalities PRN   PT Home Exercise Plan LTR, hamstring stretching, chin tuck, upper trap stretch, pelvic tilt,    Consulted and Agree with Plan of Care Patient      Patient will benefit from skilled therapeutic intervention in order to improve the following deficits and impairments:  Pain, Postural dysfunction, Improper body mechanics, Decreased range of motion, Decreased endurance, Increased fascial restricitons, Decreased activity tolerance  Visit Diagnosis: Cervicalgia  Chronic left-sided low back pain, with sciatica presence unspecified  Abnormal posture  Muscle spasm of back     Problem List Patient Active Problem List   Diagnosis  Date Noted  . Vitamin D deficiency 10/04/2015  . OSA (obstructive sleep apnea) 10/03/2015  . Hearing loss 10/03/2015  . Unspecified constipation 10/07/2013  . Achalasia 11/08/2012  . Esophageal diverticulum 05/12/2012  . Onychomycosis 05/12/2012  . Candida esophagitis (HCC) 05/03/2012  . PNA (pneumonia) 05/01/2012  . Down's syndrome 04/29/2012  . Hypothyroidism 04/29/2012    Dessie Coma PT DPT  06/25/2017, 9:10 AM  [facility] [address] [place], [place], [address] Phone: [phone]   Fax:  [phone]  Name: Ronald Le MRN: [id number] Date of Birth: 1991-08-29

## 2017-06-29 ENCOUNTER — Ambulatory Visit: Payer: Medicare Other | Admitting: Physical Therapy

## 2017-06-29 DIAGNOSIS — M545 Low back pain: Secondary | ICD-10-CM

## 2017-06-29 DIAGNOSIS — M542 Cervicalgia: Secondary | ICD-10-CM | POA: Diagnosis not present

## 2017-06-29 DIAGNOSIS — M62830 Muscle spasm of back: Secondary | ICD-10-CM | POA: Diagnosis not present

## 2017-06-29 DIAGNOSIS — R293 Abnormal posture: Secondary | ICD-10-CM

## 2017-06-29 DIAGNOSIS — G8929 Other chronic pain: Secondary | ICD-10-CM | POA: Diagnosis not present

## 2017-06-30 ENCOUNTER — Encounter: Payer: Self-pay | Admitting: Physical Therapy

## 2017-06-30 NOTE — Therapy (Signed)
[facility] Outpatient Rehabilitation [facility] [address] [place], [place], [address] Phone: [phone]   Fax:  [phone]  Physical Therapy Treatment  Patient Details  Name: Ronald Le MRN: [id number] Date of Birth: 02-17-1991 Referring Provider: Joselyn Arrow, MD  Encounter Date: 06/29/2017      PT End of Session - 06/30/17 1329    Visit Number 7   Number of Visits 13   Date for PT Re-Evaluation 07/06/17   Authorization Type MCR: KX mod by 15th visit, Progress note by 10th or 8/2/   PT Start Time 1635   PT Stop Time 1715   PT Time Calculation (min) 40 min   Activity Tolerance Patient tolerated treatment well   Behavior During Therapy [facility] for tasks assessed/performed      Past Medical History:  Diagnosis Date  . Allergy   . Candida esophagitis (HCC) 04/2012   after ABX for pneumonia  . Chronic serous otitis media   . Communication problem    "when he feels bad he just communicates w/thumbs up or down or shakes his head; very limited communication normally" (09/30/2012)  . Complication of anesthesia 08/2012   "hard to wake up after balloon dilatation)  . Complication of anesthesia 1999   "respiratory arrest after hip OR; had to get Narcan" (09/30/2012)  . Down's syndrome   . Dysphagia 09/30/2012   now resolved  . Finger fracture 2009   h/o  . GERD (gastroesophageal reflux disease)    "maybe" (09/30/2012)  . Heart murmur    "@ birth; resolved" (09/30/2012)  . Hypothyroidism   . Legg-Calve-Perthes disease   . OSA on CPAP    noncompliant with use of CPAP  . Peristalsis    "almost absent" (09/30/2012)  . Pneumonia    multiple hospitalizatons as toddler, but 10 day stay age 31yo  . Viral syndrome 09/1995   hospital viral syndrome.    Past Surgical History:  Procedure Laterality Date  . ADENOIDECTOMY AND MYRINGOTOMY WITH TUBE PLACEMENT  11/1996   bilaterally  . BALLOON DILATION  09/01/2012   Procedure: BALLOON DILATION;  Surgeon: Willis Modena, MD;  Location: [facility] ENDOSCOPY;  Service: Endoscopy;  Laterality: N/A;  . ESOPHAGOGASTRODUODENOSCOPY  10/01/2012   Procedure: ESOPHAGOGASTRODUODENOSCOPY (EGD);  Surgeon: Vertell Novak., MD;  Location: [facility] ENDOSCOPY;  Service: Endoscopy;  Laterality: N/A;  . ESOPHAGOGASTRODUODENOSCOPY  10/01/12  . ESOPHAGOGASTRODUODENOSCOPY (EGD) WITH PROPOFOL N/A 12/21/2013   Procedure: ESOPHAGOGASTRODUODENOSCOPY (EGD) WITH PROPOFOL;  Surgeon: Willis Modena, MD;  Location: [facility] ENDOSCOPY;  Service: Endoscopy;  Laterality: N/A;  . GUM SURGERY  ~ 2004   graft to gums from roof of mouth.  Marland Kitchen HIP ARTHROPLASTY  10/1998   left; due to Legg-Calve-Perthe dz and subsequent surgery to remove hardware  . LAPAROSCOPIC HELLER MYOTOMY  11/11/12   Dr.Farrell  . laparoscopic surgery  11-16-13   . TYMPANOSTOMY TUBE PLACEMENT  11/1996   bilaterally    There were no vitals filed for this visit.      Subjective Assessment - 06/30/17 1334    Subjective Patient reports his neck and back are sore today. He can not give a reason 2nd to communication difficultys.    Pertinent History hx of down syndrome   Limitations Lifting   How long can you sit comfortably? 15 min   How long can you stand comfortably? 60 min   How long can you walk comfortably? 60 min   Diagnostic tests x-ray for neck   Currently in Pain? Yes  Pain Score --  faces scale 6/10 with UE activity    Pain Location Neck   Pain Orientation Right   Pain Descriptors / Indicators Aching   Pain Type Chronic pain   Pain Onset More than a month ago   Pain Frequency Intermittent   Aggravating Factors  looking up    Pain Relieving Factors resting    Multiple Pain Sites Yes   Pain Score 4  using faces pain scale   Pain Location Back   Pain Orientation Left   Pain Descriptors / Indicators Sore   Pain Type Chronic pain   Pain Onset More than a month ago   Pain Frequency Intermittent   Aggravating Factors  leaning back and leaning forward    Pain  Relieving Factors resting                          [facility] Adult PT Treatment/Exercise - 06/30/17 0001      Neck Exercises: Standing   Other Standing Exercises scap retraction 2x10 yellow; shoulder extension 2x10 yellow; moderate cuing for each; some difficulty with right hand 2nd to sitches.   Other Standing Exercises standing shoulder flexion with wand 2x10      Neck Exercises: Seated   Other Seated Exercise seated thoracic rotation hugging bolster 2 x 10      Neck Exercises: Supine   Neck Retraction 10 reps;5 secs   Neck Retraction Limitations max cuing    Other Supine Exercise ceiling punches/bench press 2 x 25  3# on dowel rodusing bil UE    Other Supine Exercise horizontal shoulder abduction 2 x 15 with yellow therband.      Lumbar Exercises: Stretches   Active Hamstring Stretch 2 reps;30 seconds   Lower Trunk Rotation Limitations 2x10     Lumbar Exercises: Standing   Other Standing Lumbar Exercises heel raise 2x10 on air-ex; mini squats on air-ex 2x10   Other Standing Lumbar Exercises standing 3way hip 2x10 with mod cuing; reobounder with soft ball     Manual Therapy   Soft tissue mobilization to cervical paraspinals and upper traps to reduce muslce spasming and pain/    Passive ROM PROM of the hips into flexion ER and IR                 PT Education - 06/30/17 1337    Education provided Yes   Education Details continues to require max cuing for technique with exefcises.    Person(s) Educated Patient;Parent(s)   Methods Explanation;Demonstration;Tactile cues;Verbal cues   Comprehension Verbalized understanding;Returned demonstration;Verbal cues required;Tactile cues required          PT Short Term Goals - 06/30/17 1409      PT SHORT TERM GOAL #1   Title pt to be Min A with initial HEP (06/15/2017)   Baseline continues to require cuing    Time 3   Period Weeks   Status On-going     PT SHORT TERM GOAL #2   Title pt will demo decreaed  upper trap and surrounding muscle tightness to reduce pain and improve cervical mobility (06/15/2017)   Baseline right upper trap spasm less    Time 3   Period Weeks   Status On-going           PT Long Term Goals - 05/25/17 1753      PT LONG TERM GOAL #1   Title pt will increase increase cervical flexion/ extension by >/= 10 degrees  with </= 2/10 pain for functional mobility required for ADLs ((07/06/2017)   Time 6   Period Weeks   Status New     PT LONG TERM GOAL #2   Title pt will be able to perform trunk motions WNL with </= 2/10 no report of tightness or referral of pain to improve QOL ((07/06/2017))   Time 6   Period Weeks   Status New     PT LONG TERM GOAL #3   Title increase FOTO score to </= 27% limited to demo improvement in function    Time 6   Period Weeks   Status New     PT LONG TERM GOAL #4   Title pt will be </= Min A with all HEP given as of last visit (07/06/2017)               Plan - 06/30/17 1401    Clinical Impression Statement Patient continues to require max /mod vc's and TC's for technique and to stay on task. He has spasming of his upper traps and lower lumbar spine. He does not appear to be making much progress. Patient has 1 more visit scheduled. Therapy will review plan with patiehts mother next visit.    History and Personal Factors relevant to plan of care: downs syndrome    Clinical Presentation Unstable   Clinical Decision Making High  difficulty with communication    Rehab Potential Good   PT Frequency 2x / week   PT Duration 8 weeks   PT Treatment/Interventions ADLs/Self Care Home Management;Cryotherapy;Electrical Stimulation;Iontophoresis 4mg /ml Dexamethasone;Moist Heat;Traction;Ultrasound;Therapeutic activities;Therapeutic exercise;Dry needling;Taping;Manual techniques;Patient/family education;Gait training   PT Next Visit Plan assess/ review HEP and update PRN, manual for upper traps/ levator scapulae and L thoracolumbar  paraspinals, posture correction exercise, modalities PRN   PT Home Exercise Plan LTR, hamstring stretching, chin tuck, upper trap stretch, pelvic tilt,    Consulted and Agree with Plan of Care Patient      Patient will benefit from skilled therapeutic intervention in order to improve the following deficits and impairments:  Pain, Postural dysfunction, Improper body mechanics, Decreased range of motion, Decreased endurance, Increased fascial restricitons, Decreased activity tolerance  Visit Diagnosis: Cervicalgia  Chronic left-sided low back pain, with sciatica presence unspecified  Abnormal posture  Muscle spasm of back     Problem List Patient Active Problem List   Diagnosis Date Noted  . Vitamin D deficiency 10/04/2015  . OSA (obstructive sleep apnea) 10/03/2015  . Hearing loss 10/03/2015  . Unspecified constipation 10/07/2013  . Achalasia 11/08/2012  . Esophageal diverticulum 05/12/2012  . Onychomycosis 05/12/2012  . Candida esophagitis (HCC) 05/03/2012  . PNA (pneumonia) 05/01/2012  . Down's syndrome 04/29/2012  . Hypothyroidism 04/29/2012    Dessie Coma PT DPT  06/30/2017, 2:11 PM  [facility] [address] [place], [place], [address] Phone: [phone]   Fax:  [phone]  Name: Ronald Le MRN: [id number] Date of Birth: 1991-04-27

## 2017-07-01 ENCOUNTER — Ambulatory Visit: Payer: Medicare Other | Admitting: Physical Therapy

## 2017-07-01 DIAGNOSIS — M545 Low back pain: Secondary | ICD-10-CM

## 2017-07-01 DIAGNOSIS — R293 Abnormal posture: Secondary | ICD-10-CM

## 2017-07-01 DIAGNOSIS — M62830 Muscle spasm of back: Secondary | ICD-10-CM | POA: Diagnosis not present

## 2017-07-01 DIAGNOSIS — M542 Cervicalgia: Secondary | ICD-10-CM | POA: Diagnosis not present

## 2017-07-01 DIAGNOSIS — G8929 Other chronic pain: Secondary | ICD-10-CM

## 2017-07-01 NOTE — Therapy (Signed)
[facility] Outpatient Rehabilitation [facility] [address] [place], [place], [address] Phone: [phone]   Fax:  [phone]  Physical Therapy Treatment  Patient Details  Name: Ronald Le MRN: [id number] Date of Birth: [birth year] Referring Provider: Joselyn Arrow, MD  Encounter Date: 07/01/2017      PT End of Session - 07/01/17 1759    Visit Number 8   Number of Visits 13   Date for PT Re-Evaluation 07/06/17   Authorization Type MCR: KX mod by 15th visit, Progress note by 10th or 8/2/   PT Start Time 1640   PT Stop Time 1722   PT Time Calculation (min) 42 min   Activity Tolerance Patient tolerated treatment well   Behavior During Therapy [facility] for tasks assessed/performed      Past Medical History:  Diagnosis Date  . Allergy   . Candida esophagitis (HCC) 04/2012   after ABX for pneumonia  . Chronic serous otitis media   . Communication problem    "when he feels bad he just communicates w/thumbs up or down or shakes his head; very limited communication normally" (09/30/2012)  . Complication of anesthesia 08/2012   "hard to wake up after balloon dilatation)  . Complication of anesthesia 1999   "respiratory arrest after hip OR; had to get Narcan" (09/30/2012)  . Down's syndrome   . Dysphagia 09/30/2012   now resolved  . Finger fracture 2009   h/o  . GERD (gastroesophageal reflux disease)    "maybe" (09/30/2012)  . Heart murmur    "@ birth; resolved" (09/30/2012)  . Hypothyroidism   . Legg-Calve-Perthes disease   . OSA on CPAP    noncompliant with use of CPAP  . Peristalsis    "almost absent" (09/30/2012)  . Pneumonia    multiple hospitalizatons as toddler, but 10 day stay age 4yo  . Viral syndrome 09/1995   hospital viral syndrome.    Past Surgical History:  Procedure Laterality Date  . ADENOIDECTOMY AND MYRINGOTOMY WITH TUBE PLACEMENT  11/1996   bilaterally  . BALLOON DILATION  09/01/2012   Procedure: BALLOON DILATION;  Surgeon: Willis Modena, MD;  Location: [facility] ENDOSCOPY;  Service: Endoscopy;  Laterality: N/A;  . ESOPHAGOGASTRODUODENOSCOPY  10/01/2012   Procedure: ESOPHAGOGASTRODUODENOSCOPY (EGD);  Surgeon: Vertell Novak., MD;  Location: [facility] ENDOSCOPY;  Service: Endoscopy;  Laterality: N/A;  . ESOPHAGOGASTRODUODENOSCOPY  10/01/12  . ESOPHAGOGASTRODUODENOSCOPY (EGD) WITH PROPOFOL N/A 12/21/2013   Procedure: ESOPHAGOGASTRODUODENOSCOPY (EGD) WITH PROPOFOL;  Surgeon: Willis Modena, MD;  Location: [facility] ENDOSCOPY;  Service: Endoscopy;  Laterality: N/A;  . GUM SURGERY  ~ 2004   graft to gums from roof of mouth.  Marland Kitchen HIP ARTHROPLASTY  10/1998   left; due to Legg-Calve-Perthe dz and subsequent surgery to remove hardware  . LAPAROSCOPIC HELLER MYOTOMY  11/11/12   Dr.Farrell  . laparoscopic surgery  11-16-13   . TYMPANOSTOMY TUBE PLACEMENT  11/1996   bilaterally    There were no vitals filed for this visit.      Subjective Assessment - 06/30/17 1334    Subjective Patient reports his neck and back are sore today. He can not give a reason 2nd to communication difficultys.    Pertinent History hx of down syndrome   Limitations Lifting   How long can you sit comfortably? 15 min   How long can you stand comfortably? 60 min   How long can you walk comfortably? 60 min   Diagnostic tests x-ray for neck   Currently in Pain? Yes  Pain Score --  faces scale 6/10 with UE activity    Pain Location Neck   Pain Orientation Right   Pain Descriptors / Indicators Aching   Pain Type Chronic pain   Pain Onset More than a month ago   Pain Frequency Intermittent   Aggravating Factors  looking up    Pain Relieving Factors resting    Multiple Pain Sites Yes   Pain Score 4  using faces pain scale   Pain Location Back   Pain Orientation Left   Pain Descriptors / Indicators Sore   Pain Type Chronic pain   Pain Onset More than a month ago   Pain Frequency Intermittent   Aggravating Factors  leaning back and leaning forward    Pain  Relieving Factors resting                          [facility] Adult PT Treatment/Exercise - 07/01/17 1755      Neck Exercises: Seated   Neck Retraction 10 reps;5 secs     Shoulder Exercises: Standing   Protraction AROM;Both;15 reps;Theraband   Theraband Level (Shoulder Protraction) Level 1 (Yellow)   Extension Strengthening;Both;15 reps;Theraband   Theraband Level (Shoulder Extension) Level 1 (Yellow)   Row Strengthening;Both;15 reps;Theraband   Theraband Level (Shoulder Row) Level 1 (Yellow)     Manual Therapy   Manual therapy comments manual trigger point release and upper traps, sub-occipital release,    Soft tissue mobilization IASTM over bil upper trap     Neck Exercises: Stretches   Upper Trapezius Stretch 2 reps;30 seconds   Levator Stretch 2 reps;30 seconds                PT Education - 07/01/17 1800    Education provided Yes   Education Details positioning at home to avoid prolong positiong especially with electronic devices due to muscle spasm and pain that could potentially trigger tightness and pain inthe surrounding neck musculature.  how to perform manual trigger point release techniques.    Person(s) Educated Patient;Parent(s)   Methods Explanation;Verbal cues;Demonstration   Comprehension Verbalized understanding;Verbal cues required          PT Short Term Goals - 06/30/17 1409      PT SHORT TERM GOAL #1   Title pt to be Min A with initial HEP (06/15/2017)   Baseline continues to require cuing    Time 3   Period Weeks   Status On-going     PT SHORT TERM GOAL #2   Title pt will demo decreaed upper trap and surrounding muscle tightness to reduce pain and improve cervical mobility (06/15/2017)   Baseline right upper trap spasm less    Time 3   Period Weeks   Status On-going           PT Long Term Goals - 05/25/17 1753      PT LONG TERM GOAL #1   Title pt will increase increase cervical flexion/ extension by >/= 10 degrees  with </= 2/10 pain for functional mobility required for ADLs ((07/06/2017)   Time 6   Period Weeks   Status New     PT LONG TERM GOAL #2   Title pt will be able to perform trunk motions WNL with </= 2/10 no report of tightness or referral of pain to improve QOL ((07/06/2017))   Time 6   Period Weeks   Status New     PT LONG TERM GOAL #3  Title increase FOTO score to </= 27% limited to demo improvement in function    Time 6   Period Weeks   Status New     PT LONG TERM GOAL #4   Title pt will be </= Min A with all HEP given as of last visit (07/06/2017)               Plan - 07/01/17 1801    Clinical Impression Statement pt requires verbal/ tactile cues to stay engaged and focused as well for proper form. discussed with pt's aparent about positioning to prevent/ reduce muscle tightness. perofrmed manual trigger point release and demonstrated with parent so it can be performed at home. He as able to do all exercise with on report of pain. plan to continue for few more sessions to work on finalizing HEP and addressing any issues or concerns.    PT Next Visit Plan assess/ review HEP and update PRN, manual for upper traps/ levator scapulae and L thoracolumbar paraspinals, posture correction exercise, modalities PRN   Consulted and Agree with Plan of Care Patient      Patient will benefit from skilled therapeutic intervention in order to improve the following deficits and impairments:  Pain, Postural dysfunction, Improper body mechanics, Decreased range of motion, Decreased endurance, Increased fascial restricitons, Decreased activity tolerance  Visit Diagnosis: Cervicalgia  Chronic left-sided low back pain, with sciatica presence unspecified  Abnormal posture  Muscle spasm of back     Problem List Patient Active Problem List   Diagnosis Date Noted  . Vitamin D deficiency 10/04/2015  . OSA (obstructive sleep apnea) 10/03/2015  . Hearing loss 10/03/2015  . Unspecified  constipation 10/07/2013  . Achalasia 11/08/2012  . Esophageal diverticulum 05/12/2012  . Onychomycosis 05/12/2012  . Candida esophagitis (HCC) 05/03/2012  . PNA (pneumonia) 05/01/2012  . Down's syndrome 04/29/2012  . Hypothyroidism 04/29/2012   Lulu Riding PT, DPT, LAT, ATC  07/01/17  6:04 PM      [facility] Outpatient Rehabilitation [facility] [address] [place], [place], [address] Phone: [phone]   Fax:  [phone]  Name: ARASH HEAP MRN: [id number] Date of Birth: [birth year]

## 2017-07-08 ENCOUNTER — Encounter: Payer: Self-pay | Admitting: Physical Therapy

## 2017-07-08 ENCOUNTER — Ambulatory Visit: Payer: Medicare Other | Admitting: Physical Therapy

## 2017-07-08 DIAGNOSIS — M62830 Muscle spasm of back: Secondary | ICD-10-CM | POA: Diagnosis not present

## 2017-07-08 DIAGNOSIS — G8929 Other chronic pain: Secondary | ICD-10-CM | POA: Diagnosis not present

## 2017-07-08 DIAGNOSIS — M542 Cervicalgia: Secondary | ICD-10-CM

## 2017-07-08 DIAGNOSIS — M545 Low back pain: Secondary | ICD-10-CM

## 2017-07-08 DIAGNOSIS — R293 Abnormal posture: Secondary | ICD-10-CM | POA: Diagnosis not present

## 2017-07-08 NOTE — Therapy (Signed)
[place], [place], [address] Phone: [phone]   Fax:  [phone]  Physical Therapy Treatment / Re-certification  Patient Details  Name: Ronald Le MRN: [id number] Date of Birth: 1991-01-18 Referring Provider: Rita Ohara, MD  Encounter Date: 07/08/2017      PT End of Session - 07/08/17 1433    Visit Number 9   Number of Visits 13   Date for PT Re-Evaluation 07/22/17   Authorization Type MCR: KX mod by 15th visit, Progress note by 19th visit    PT Start Time 1417   PT Stop Time 1500   PT Time Calculation (min) 43 min   Activity Tolerance Patient tolerated treatment well;No increased pain   Behavior During Therapy WFL for tasks assessed/performed      Past Medical History:  Diagnosis Date  . Allergy   . Candida esophagitis ([place]) 04/2012   after ABX for pneumonia  . Chronic serous otitis media   . Communication problem    "when he feels bad he just communicates w/thumbs up or down or shakes his head; very limited communication normally" (09/30/2012)  . Complication of anesthesia 08/2012   "hard to wake up after balloon dilatation)  . Complication of anesthesia 1999   "respiratory arrest after hip OR; had to get Narcan" (09/30/2012)  . Down's syndrome   . Dysphagia 09/30/2012   now resolved  . Finger fracture 2009   h/o  . GERD (gastroesophageal reflux disease)    "maybe" (09/30/2012)  . Heart murmur    "@ birth; resolved" (09/30/2012)  . Hypothyroidism   . Legg-Calve-Perthes disease   . OSA on CPAP    noncompliant with use of CPAP  . Peristalsis    "almost absent" (09/30/2012)  . Pneumonia    multiple hospitalizatons as toddler, but 10 day stay age 89yo  . Viral syndrome 09/1995   hospital viral syndrome.    Past Surgical History:  Procedure Laterality Date  . ADENOIDECTOMY AND MYRINGOTOMY WITH TUBE PLACEMENT  11/1996   bilaterally  . BALLOON DILATION  09/01/2012   Procedure:  BALLOON DILATION;  Surgeon: Arta Silence, MD;  Location: [facility] ENDOSCOPY;  Service: Endoscopy;  Laterality: N/A;  . ESOPHAGOGASTRODUODENOSCOPY  10/01/2012   Procedure: ESOPHAGOGASTRODUODENOSCOPY (EGD);  Surgeon: Winfield Cunas., MD;  Location[facility] ENDOSCOPY;  Service: Endoscopy;  Laterality: N/A;  . ESOPHAGOGASTRODUODENOSCOPY  10/01/12  . ESOPHAGOGASTRODUODENOSCOPY (EGD) WITH PROPOFOL N/A 12/21/2013   Procedure: ESOPHAGOGASTRODUODENOSCOPY (EGD) WITH PROPOFOL;  Surgeon: Arta Silence, MD;  Location: [facility] ENDOSCOPY;  Service: Endoscopy;  Laterality: N/A;  . GUM SURGERY  ~ 2004   graft to gums from roof of mouth.  Marland Kitchen HIP ARTHROPLASTY  10/1998   left; due to Legg-Calve-Perthe dz and subsequent surgery to remove hardware  . LAPAROSCOPIC HELLER MYOTOMY  11/11/12   Dr.Farrell  . laparoscopic surgery  11-16-13   . TYMPANOSTOMY TUBE PLACEMENT  11/1996   bilaterally    There were no vitals filed for this visit.      Subjective Assessment - 07/08/17 1421    Subjective "no pain for almost 10 days"   Currently in Pain? No/denies   Pain Score 0-No pain  using wong baker scale    Pain Location Neck   Pain Orientation Right   Pain Frequency Intermittent   Aggravating Factors  looking up   Pain Relieving Factors getting out of the position   Pain Score 0   Pain Location Back   Pain Orientation Left  Pain Type Chronic pain            [facility] PT Assessment - 07/08/17 1427      Observation/Other Assessments   Focus on Therapeutic Outcomes (FOTO)  21% limited     AROM   Cervical Flexion 60   Cervical Extension 80  reports 2 /10 pain    Cervical - Right Side Bend 50   Cervical - Left Side Bend 50   Cervical - Right Rotation 80   Cervical - Left Rotation 84   Lumbar Flexion 80   Lumbar Extension 38   Lumbar - Right Side Bend 26   Lumbar - Left Side Bend 28     Strength   Right Hip Flexion 5/5   Right Hip Extension 4/5   Right Hip ABduction 4+/5   Right Hip ADduction 4+/5   Left  Hip Flexion 5/5   Left Hip Extension 4/5   Left Hip ABduction 4+/5   Left Hip ADduction 4+/5   Right Knee Flexion 5/5   Right Knee Extension 5/5   Left Knee Flexion 5/5   Left Knee Extension 5/5                     [facility] Adult PT Treatment/Exercise - 07/08/17 0001      Neck Exercises: Machines for Strengthening   UBE (Upper Arm Bike) L1 x 5 min  changing direction at 2:30 sec     Neck Exercises: Standing   Other Standing Exercises scap retraction 2x10 red; shoulder extension 2x10 red  demonstration/ competition to go slow and controlled     Lumbar Exercises: Seated   Hip Flexion on Ball Limitations "V" up exercise 3 x 20 sec hold     Manual Therapy   Manual therapy comments manual trigger point release over the L thoracolumbar paraspinals                PT Education - 07/08/17 1502    Education provided Yes   Education Details proper posture with sitting/ standing to avoid spasm   Person(s) Educated Patient;Parent(s)   Methods Explanation;Verbal cues   Comprehension Verbalized understanding;Verbal cues required          PT Short Term Goals - 07/08/17 1734      PT SHORT TERM GOAL #1   Title pt to be Min A with initial HEP (06/15/2017)   Time 3   Period Weeks   Status Achieved     PT SHORT TERM GOAL #2   Title pt will demo decreaed upper trap and surrounding muscle tightness to reduce pain and improve cervical mobility (06/15/2017)   Time 3   Period Weeks   Status Achieved           PT Long Term Goals - 07/08/17 1734      PT LONG TERM GOAL #1   Title pt will increase increase cervical flexion/ extension by >/= 10 degrees with </= 2/10 pain for functional mobility required for ADLs ((07/06/2017)   Baseline 2/10 pain with extension   Time 6   Period Weeks   Status Partially Met     PT LONG TERM GOAL #2   Title pt will be able to perform trunk motions WNL with </= 2/10 no report of tightness or referral of pain to improve QOL  ((07/06/2017))   Time 6   Period Weeks   Status Achieved     PT LONG TERM GOAL #3   Title increase FOTO score to </=  27% limited to demo improvement in function    Time 6   Period Weeks   Status Achieved     PT LONG TERM GOAL #4   Title pt will be </= Min A with all HEP given as of last visit (07/06/2017)   Time 6   Period Weeks   Status Partially Met               Plan - 07/24/2017 1735    Clinical Impression Statement pt reported no pain in the back or neck today except for extension which he only reported 2/10 via wong baker scale. He was able to perform exercises requiring constant cues for proper form and to stay on track. He is progressing well with treament meeting / partially meeting goals.  Plan to continued with current POC for only 1-2 more visits to work on remaining goals and independent with minA exercise. .    Rehab Potential Good   PT Frequency 2x / week   PT Duration 2 weeks   PT Treatment/Interventions ADLs/Self Care Home Management;Cryotherapy;Electrical Stimulation;Iontophoresis 48m/ml Dexamethasone;Moist Heat;Traction;Ultrasound;Therapeutic activities;Therapeutic exercise;Dry needling;Taping;Manual techniques;Patient/family education;Gait training   PT Next Visit Plan update HEP, manual for upper traps/ levator scapulae and L thoracolumbar paraspinals, posture correction exercise, modalities PRN   PT Home Exercise Plan LTR, hamstring stretching, chin tuck, upper trap stretch, pelvic tilt, standing/ sitting posture   Consulted and Agree with Plan of Care Patient      Patient will benefit from skilled therapeutic intervention in order to improve the following deficits and impairments:  Pain, Postural dysfunction, Improper body mechanics, Decreased range of motion, Decreased endurance, Increased fascial restricitons, Decreased activity tolerance  Visit Diagnosis: Cervicalgia - Plan: PT plan of care cert/re-cert  Chronic left-sided low back pain, with sciatica  presence unspecified - Plan: PT plan of care cert/re-cert  Abnormal posture - Plan: PT plan of care cert/re-cert  Muscle spasm of back - Plan: PT plan of care cert/re-cert       G-Codes - [id number]    Functional Assessment Tool Used (Outpatient Only) clinical judgement/ FOTO    Functional Limitation Changing and maintaining body position   Changing and Maintaining Body Position Current Status ((S2876 At least 20 percent but less than 40 percent impaired, limited or restricted   Changing and Maintaining Body Position Goal Status ((O1157 At least 20 percent but less than 40 percent impaired, limited or restricted      Problem List Patient Active Problem List   Diagnosis Date Noted  . Vitamin D deficiency 10/04/2015  . OSA (obstructive sleep apnea) 10/03/2015  . Hearing loss 10/03/2015  . Unspecified constipation 10/07/2013  . Achalasia 11/08/2012  . Esophageal diverticulum 05/12/2012  . Onychomycosis 05/12/2012  . [place]ida esophagitis ([place] 05/03/2012  . PNA (pneumonia) 05/01/2012  . Down's syndrome 04/29/2012  . Hypothyroidism 04/29/2012    KStarr LakePT, DPT, LAT, ATC  031-Aug-2018 5:41 PM      [facility]GNor[place] N[place] [address]Phone: [phone]  Fax:  [phone] Name: DIBN STIEFMRN: [id number] of Birth: 111-Jul-1992

## 2017-07-08 NOTE — Patient Instructions (Signed)

## 2017-07-13 ENCOUNTER — Ambulatory Visit: Payer: Medicare Other | Admitting: Physical Therapy

## 2017-07-23 ENCOUNTER — Ambulatory Visit: Payer: Medicare Other | Admitting: Physical Therapy

## 2017-07-23 ENCOUNTER — Encounter: Payer: Self-pay | Admitting: Physical Therapy

## 2017-07-23 DIAGNOSIS — M542 Cervicalgia: Secondary | ICD-10-CM

## 2017-07-23 DIAGNOSIS — G8929 Other chronic pain: Secondary | ICD-10-CM | POA: Diagnosis not present

## 2017-07-23 DIAGNOSIS — M545 Low back pain: Secondary | ICD-10-CM

## 2017-07-23 DIAGNOSIS — R293 Abnormal posture: Secondary | ICD-10-CM | POA: Diagnosis not present

## 2017-07-23 DIAGNOSIS — M62830 Muscle spasm of back: Secondary | ICD-10-CM

## 2017-07-23 NOTE — Therapy (Signed)
[place], [place], [address] Phone: [phone]   Fax:  [phone]  Physical Therapy Treatment / Discharge Summary  Patient Details  Name: Ronald Le MRN: [id number] Date of Birth: [birth year] Referring Provider: Rita Ohara, MD  Encounter Date: 07/23/2017      PT End of Session - 07/23/17 6269    Visit Number 10   Number of Visits 13   Date for PT Re-Evaluation 07/23/17   Authorization Type MCR: KX mod by 15th visit, Progress note by 19th visit    PT Start Time 1100   PT Stop Time 1140   PT Time Calculation (min) 40 min   Activity Tolerance Patient tolerated treatment well   Behavior During Therapy [facility] for tasks assessed/performed      Past Medical History:  Diagnosis Date  . Allergy   . Candida esophagitis ([place]) 04/2012   after ABX for pneumonia  . Chronic serous otitis media   . Communication problem    "when he feels bad he just communicates w/thumbs up or down or shakes his head; very limited communication normally" (09/30/2012)  . Complication of anesthesia 08/2012   "hard to wake up after balloon dilatation)  . Complication of anesthesia 1999   "respiratory arrest after hip OR; had to get Narcan" (09/30/2012)  . Down's syndrome   . Dysphagia 09/30/2012   now resolved  . Finger fracture 2009   h/o  . GERD (gastroesophageal reflux disease)    "maybe" (09/30/2012)  . Heart murmur    "@ birth; resolved" (09/30/2012)  . Hypothyroidism   . Legg-Calve-Perthes disease   . OSA on CPAP    noncompliant with use of CPAP  . Peristalsis    "almost absent" (09/30/2012)  . Pneumonia    multiple hospitalizatons as toddler, but 10 day stay age 68yo  . Viral syndrome 09/1995   hospital viral syndrome.    Past Surgical History:  Procedure Laterality Date  . ADENOIDECTOMY AND MYRINGOTOMY WITH TUBE PLACEMENT  11/1996   bilaterally  . BALLOON DILATION  09/01/2012   Procedure: BALLOON  DILATION;  Surgeon: Arta Silence, MD;  Location: [facility] ENDOSCOPY;  Service: Endoscopy;  Laterality: N/A;  . ESOPHAGOGASTRODUODENOSCOPY  10/01/2012   Procedure: ESOPHAGOGASTRODUODENOSCOPY (EGD);  Surgeon: Winfield Cunas., MD;  Location: [facility] ENDOSCOPY;  Service: Endoscopy;  Laterality: N/A;  . ESOPHAGOGASTRODUODENOSCOPY  10/01/12  . ESOPHAGOGASTRODUODENOSCOPY (EGD) WITH PROPOFOL N/A 12/21/2013   Procedure: ESOPHAGOGASTRODUODENOSCOPY (EGD) WITH PROPOFOL;  Surgeon: Arta Silence, MD;  Location: [facility] ENDOSCOPY;  Service: Endoscopy;  Laterality: N/A;  . GUM SURGERY  ~ 2004   graft to gums from roof of mouth.  Marland Kitchen HIP ARTHROPLASTY  10/1998   left; due to Legg-Calve-Perthe dz and subsequent surgery to remove hardware  . LAPAROSCOPIC HELLER MYOTOMY  11/11/12   Dr.Farrell  . laparoscopic surgery  11-16-13   . TYMPANOSTOMY TUBE PLACEMENT  11/1996   bilaterally    There were no vitals filed for this visit.      Subjective Assessment - 07/23/17 1105    Subjective "I am doing pretty good today"    Currently in Pain? No/denies   Multiple Pain Sites No            [facility] PT Assessment - 07/23/17 0001      AROM   Cervical Flexion 60   Cervical Extension 80   Cervical - Right Side Bend 50   Cervical - Left Side Bend 50   Cervical - Right  Rotation 80   Cervical - Left Rotation 84   Lumbar Flexion 80   Lumbar Extension 38   Lumbar - Right Side Bend 26   Lumbar - Left Side Bend 28     Strength   Right Hip Flexion 5/5   Right Hip Extension 4/5   Right Hip ABduction 4+/5   Right Hip ADduction 4+/5   Left Hip Flexion 5/5   Left Hip Extension 4/5   Left Hip ABduction 4+/5   Left Hip ADduction 4+/5   Right Knee Flexion 5/5   Right Knee Extension 5/5   Left Knee Flexion 5/5   Left Knee Extension 5/5                     [facility] Adult PT Treatment/Exercise - 07/23/17 1107      Neck Exercises: Machines for Strengthening   UBE (Upper Arm Bike) L2 x 6 min  changing directions at  3 min     Neck Exercises: Standing   Other Standing Exercises horizontal abduction 1 x 10  with blue theraband     Lumbar Exercises: Standing   Heel Raises 15 reps  x 2 sets   Other Standing Lumbar Exercises deltoid lateral raise, front raise 1 x 10 each with 3#     Lumbar Exercises: Seated   Hip Flexion on Ball Limitations "V" up exercise 3 x 20 sec hold   Sit to Stand 10 reps     Shoulder Exercises: Standing   Protraction AROM;Both;15 reps;Theraband   Theraband Level (Shoulder Protraction) Level 3 (Green)   Extension Strengthening;Both;15 reps;Theraband   Theraband Level (Shoulder Extension) Level 3 (Green)   Row Strengthening;Both;15 reps;Theraband   Theraband Level (Shoulder Row) Level 3 (Green)     Neck Exercises: Stretches   Upper Trapezius Stretch 2 reps;30 seconds   Levator Stretch 2 reps;30 seconds                PT Education - 07/23/17 1240    Education provided Yes   Education Details reviewed exercises with pt and parent and benefits of continued posture.    Person(s) Educated Patient   Methods Explanation;Verbal cues   Comprehension Verbalized understanding;Verbal cues required          PT Short Term Goals - 07/08/17 1734      PT SHORT TERM GOAL #1   Title pt to be Min A with initial HEP (06/15/2017)   Time 3   Period Weeks   Status Achieved     PT SHORT TERM GOAL #2   Title pt will demo decreaed upper trap and surrounding muscle tightness to reduce pain and improve cervical mobility (06/15/2017)   Time 3   Period Weeks   Status Achieved           PT Long Term Goals - 07/23/17 1242      PT LONG TERM GOAL #1   Title pt will increase increase cervical flexion/ extension by >/= 10 degrees with </= 2/10 pain for functional mobility required for ADLs ((07/06/2017)   Time 6   Period Weeks   Status Partially Met     PT LONG TERM GOAL #2   Title pt will be able to perform trunk motions WNL with </= 2/10 no report of tightness or referral  of pain to improve QOL ((07/06/2017))   Time 6   Period Weeks   Status Achieved     PT LONG TERM GOAL #3   Title increase FOTO  score to </= 27% limited to demo improvement in function    Time 6   Period Weeks   Status Achieved     PT LONG TERM GOAL #4   Title pt will be </= Min A with all HEP given as of last visit (07/06/2017)   Time 6   Period Weeks   Status Achieved               Plan - 08-11-2017 1242    Clinical Impression Statement pt reports no pain via Rodena Goldmann and perfrom all exercise with no issues today. He performed all exercises today with no report of pain or signs indicating discomfort. He has met or partially met all goals and per pt and his mom they are able to maintain his current level of function independently and will be discharged today.    PT Next Visit Plan d/C   PT Home Exercise Plan LTR, hamstring stretching, chin tuck, upper trap stretch, pelvic tilt, standing/ sitting posture   Consulted and Agree with Plan of Care Patient      Patient will benefit from skilled therapeutic intervention in order to improve the following deficits and impairments:  Pain, Postural dysfunction, Improper body mechanics, Decreased range of motion, Decreased endurance, Increased fascial restricitons, Decreased activity tolerance  Visit Diagnosis: Cervicalgia - Plan: PT plan of care cert/re-cert  Chronic left-sided low back pain, with sciatica presence unspecified - Plan: PT plan of care cert/re-cert  Abnormal posture - Plan: PT plan of care cert/re-cert  Muscle spasm of back - Plan: PT plan of care cert/re-cert       G-Codes - 08-11-2017 1246    Functional Assessment Tool Used (Outpatient Only) clinical judgement/ FOTO    Functional Limitation Changing and maintaining body position   Changing and Maintaining Body Position Goal Status (Z0258) At least 20 percent but less than 40 percent impaired, limited or restricted   Changing and Maintaining Body Position  Discharge Status (N2778) At least 1 percent but less than 20 percent impaired, limited or restricted      Problem List Patient Active Problem List   Diagnosis Date Noted  . Vitamin D deficiency 10/04/2015  . OSA (obstructive sleep apnea) 10/03/2015  . Hearing loss 10/03/2015  . Unspecified constipation 10/07/2013  . Achalasia 11/08/2012  . Esophageal diverticulum 05/12/2012  . Onychomycosis 05/12/2012  . Candida esophagitis ([place]) 05/03/2012  . PNA (pneumonia) 05/01/2012  . Down's syndrome 04/29/2012  . Hypothyroidism 04/29/2012   Starr Lake PT, DPT, LAT, ATC  08-11-17  12:47 PM      [facility] [address] [place], [place], [address] Phone: [phone]   Fax:  [phone]  Name: Ronald Le MRN: [id number] Date of Birth: [birth year]    PHYSICAL THERAPY DISCHARGE SUMMARY  Visits from Start of Care: 10  Current functional level related to goals / functional outcomes: See goals   Remaining deficits: Pt report nothing causes pain   Education / Equipment: HEP, theraband, posture/ lifting mechanics  Plan: Patient agrees to discharge.  Patient goals were partially met. Patient is being discharged due to meeting the stated rehab goals.  ?????    Veera Stapleton PT, DPT, LAT, ATC  08-11-2017  12:48 PM

## 2017-09-09 NOTE — Progress Notes (Signed)
HPI patient presents with mother with thick and brittle nailbeds 1-5 both feet that are impossible for them to cut and painful   Review of Systems     Objective:   Physical Exam Neurovascular status unchanged with thick yellow brittle nailbeds 1-5 both feet that are painful and impossible to cut    Assessment:     Chronic mycotic nail infections which have been treated systemically previously with no success with pain    Plan:     Debride painful nailbeds 1-5 both feet with no iatrogenic bleeding noted
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
## 2017-09-30 ENCOUNTER — Telehealth: Payer: Self-pay | Admitting: Family Medicine

## 2017-09-30 NOTE — Telephone Encounter (Signed)
Left message for mother informing her that he does not need pneumo vaccine and that she can call back to schedule flu shot.

## 2017-09-30 NOTE — Telephone Encounter (Signed)
He had pneumonia vaccine 4 years ago.  Not needed now. Okay for flu shot

## 2017-09-30 NOTE — Telephone Encounter (Signed)
Does he need another pneumococcal vaccine?

## 2017-09-30 NOTE — Telephone Encounter (Signed)
Mom left message wants to schedule pt for flu shot and wants to know if it's time for next pneumonia shot?  Please call her and advise

## 2017-10-05 ENCOUNTER — Other Ambulatory Visit: Payer: Medicare Other

## 2017-10-08 ENCOUNTER — Encounter: Payer: Self-pay | Admitting: Podiatry

## 2017-10-08 ENCOUNTER — Ambulatory Visit (INDEPENDENT_AMBULATORY_CARE_PROVIDER_SITE_OTHER): Payer: Medicare Other | Admitting: Podiatry

## 2017-10-08 ENCOUNTER — Other Ambulatory Visit (INDEPENDENT_AMBULATORY_CARE_PROVIDER_SITE_OTHER): Payer: Medicare Other

## 2017-10-08 DIAGNOSIS — Z23 Encounter for immunization: Secondary | ICD-10-CM | POA: Diagnosis not present

## 2017-10-08 DIAGNOSIS — M79676 Pain in unspecified toe(s): Secondary | ICD-10-CM

## 2017-10-08 DIAGNOSIS — B351 Tinea unguium: Secondary | ICD-10-CM

## 2017-10-09 NOTE — Progress Notes (Signed)
Subjective:    Patient ID: Ronald Le, male   DOB: 26 y.o.   MRN: [id number]   HPI patient presents with nail disease 1-5 both feet that are thick incurvated and tender    ROS      Objective:  Physical Exam neurovascular status intact with thick yellow brittle nailbeds 1-5 both feet     Assessment:    Mycotic nail infection with pain 1-5 both feet     Plan:    Debris painful nailbeds 1-5 both feet with no iatrogenic bleeding noted

## 2018-01-08 ENCOUNTER — Other Ambulatory Visit: Payer: Medicare Other

## 2018-01-18 ENCOUNTER — Encounter: Payer: Self-pay | Admitting: Podiatry

## 2018-01-18 ENCOUNTER — Ambulatory Visit (INDEPENDENT_AMBULATORY_CARE_PROVIDER_SITE_OTHER): Payer: Medicare Other | Admitting: Podiatry

## 2018-01-18 DIAGNOSIS — M79676 Pain in unspecified toe(s): Secondary | ICD-10-CM

## 2018-01-18 DIAGNOSIS — B351 Tinea unguium: Secondary | ICD-10-CM

## 2018-01-19 NOTE — Progress Notes (Signed)
Subjective:   Patient ID: Ronald Le, male   DOB: 27 y.o.   MRN: [id number]   HPI Patient presents with elongated nailbeds 1-5 both feet that are very thickened and impossible to the patient to cut with soreness   ROS      Objective:  Physical Exam  Neurovascular status unchanged with thick yellow brittle nailbeds 1-5 both feet that are bothersome     Assessment:  Mycotic nail infection with pain 1-5 both feet     Plan:  Debride painful nailbeds 1-5 both feet with no iatrogenic bleeding noted and reappoint for routine care

## 2018-04-20 ENCOUNTER — Other Ambulatory Visit: Payer: Medicare Other

## 2018-04-23 ENCOUNTER — Other Ambulatory Visit: Payer: Medicare Other | Admitting: Podiatry

## 2018-05-14 ENCOUNTER — Ambulatory Visit: Payer: Medicare Other | Admitting: Podiatry

## 2018-06-01 ENCOUNTER — Encounter

## 2018-06-01 ENCOUNTER — Ambulatory Visit: Payer: Medicare Other | Admitting: Podiatry

## 2018-06-22 NOTE — Progress Notes (Deleted)
   Vitamin D deficiency:  Last level was low at 18 in 12/2016. He was treated with 12 weeks of prescription vitamin D.  He has currently been taking   Depression--last visit (04/2017) had been getting counseling and moods were better  Hypothyroidism: Per CareEverywhere, last TSH 12/2016 (0.140), no visits/labs since?? Scheduled?  OSA--using CPAP?  Neck pain--s/p PT last year   CPE in 11/2018  

## 2018-06-24 ENCOUNTER — Telehealth: Payer: Self-pay

## 2018-06-24 ENCOUNTER — Encounter: Payer: Self-pay | Admitting: Family Medicine

## 2018-06-24 NOTE — Telephone Encounter (Signed)
Forwarded to [employer]

## 2018-06-24 NOTE — Telephone Encounter (Signed)
Patient's mom called to reschedule appointment states that she broke her foot last night.   Do we need to send letter-no show fee?

## 2018-06-24 NOTE — Telephone Encounter (Signed)
No, just document as same day cancellation.  Looks like it has already been r/s to 8/19.

## 2018-06-30 ENCOUNTER — Encounter: Payer: Self-pay | Admitting: Podiatry

## 2018-06-30 ENCOUNTER — Ambulatory Visit (INDEPENDENT_AMBULATORY_CARE_PROVIDER_SITE_OTHER): Payer: Medicare Other | Admitting: Podiatry

## 2018-06-30 DIAGNOSIS — M79676 Pain in unspecified toe(s): Secondary | ICD-10-CM

## 2018-06-30 DIAGNOSIS — B351 Tinea unguium: Secondary | ICD-10-CM | POA: Diagnosis not present

## 2018-06-30 NOTE — Progress Notes (Signed)
Complaint:  Visit Type: Patient returns to my office for continued preventative foot care services. Complaint: Patient states" my nails have grown long and thick and become painful to walk and wear shoes" Patient has not been seen for over 5 months. The patient presents for preventative foot care services. No changes to ROS  Podiatric Exam: Vascular: dorsalis pedis and posterior tibial pulses are palpable bilateral. Capillary return is immediate. Temperature gradient is WNL. Skin turgor WNL  Sensorium: Normal Semmes Weinstein monofilament test. Normal tactile sensation bilaterally. Nail Exam: Pt has thick disfigured discolored nails with subungual debris noted bilateral entire nail hallux through fifth toenails Ulcer Exam: There is no evidence of ulcer or pre-ulcerative changes or infection. Orthopedic Exam: Muscle tone and strength are WNL. No limitations in general ROM. No crepitus or effusions noted. Foot type and digits show no abnormalities. Bony prominences are unremarkable. Skin: No Porokeratosis. No infection or ulcers  Diagnosis:  Onychomycosis, , Pain in right toe, pain in left toes  Treatment & Plan Procedures and Treatment: Consent by patient was obtained for treatment procedures.   Debridement of mycotic and hypertrophic toenails, 1 through 5 bilateral and clearing of subungual debris. No ulceration, no infection noted.  Return Visit-Office Procedure: Patient instructed to return to the office for a follow up visit 3 months for continued evaluation and treatment.    Dangela How DPM 

## 2018-07-10 NOTE — Progress Notes (Deleted)
   Vitamin D deficiency:  Last level was low at 18 in 12/2016. He was treated with 12 weeks of prescription vitamin D.  He has currently been taking   Depression--last visit (04/2017) had been getting counseling and moods were better  Hypothyroidism: Per CareEverywhere, last TSH 12/2016 (0.140), no visits/labs since?? Scheduled?  OSA--using CPAP?  Neck pain--s/p PT last year   CPE in 11/2018

## 2018-07-12 ENCOUNTER — Encounter: Payer: Self-pay | Admitting: Family Medicine

## 2018-07-12 ENCOUNTER — Telehealth: Payer: Self-pay | Admitting: *Deleted

## 2018-07-12 NOTE — Telephone Encounter (Signed)

## 2018-07-12 NOTE — Telephone Encounter (Signed)
Please send no show letter (his last no show was 09/2016, not sure if you want this to be "second" no show letter). He is scheduled for a physical in January I believe, but they had scheduled visit sooner, not sure what their concerns were, or if they are still seeing endocrinologist (should be) for his thyroid.

## 2018-08-02 ENCOUNTER — Encounter: Payer: Self-pay | Admitting: Family Medicine

## 2018-08-02 NOTE — Telephone Encounter (Signed)
No show letter sent.

## 2018-08-23 DIAGNOSIS — E039 Hypothyroidism, unspecified: Secondary | ICD-10-CM | POA: Diagnosis not present

## 2018-10-13 ENCOUNTER — Encounter: Payer: Self-pay | Admitting: Podiatry

## 2018-10-13 ENCOUNTER — Ambulatory Visit (INDEPENDENT_AMBULATORY_CARE_PROVIDER_SITE_OTHER): Payer: Medicare Other | Admitting: Podiatry

## 2018-10-13 DIAGNOSIS — M79605 Pain in left leg: Secondary | ICD-10-CM

## 2018-10-13 DIAGNOSIS — M79676 Pain in unspecified toe(s): Secondary | ICD-10-CM

## 2018-10-13 DIAGNOSIS — B351 Tinea unguium: Secondary | ICD-10-CM | POA: Diagnosis not present

## 2018-10-13 DIAGNOSIS — M79604 Pain in right leg: Secondary | ICD-10-CM

## 2018-10-13 NOTE — Progress Notes (Addendum)
Complaint:  Visit Type: Patient returns to my office for continued preventative foot care services. Complaint: Patient states" my nails have grown long and thick and become painful to walk and wear shoes" Patient has not been seen for over 5 months. The patient presents for preventative foot care services. No changes to ROS  Podiatric Exam: Vascular: dorsalis pedis and posterior tibial pulses are palpable bilateral. Capillary return is immediate. Temperature gradient is WNL. Skin turgor WNL  Sensorium: Normal Semmes Weinstein monofilament test. Normal tactile sensation bilaterally. Nail Exam: Pt has thick disfigured discolored nails with subungual debris noted bilateral entire nail hallux through fifth toenails Ulcer Exam: There is no evidence of ulcer or pre-ulcerative changes or infection. Orthopedic Exam: Muscle tone and strength are WNL. No limitations in general ROM. No crepitus or effusions noted. Foot type and digits show no abnormalities. Bony prominences are unremarkable. Skin: No Porokeratosis. No infection or ulcers  Diagnosis:  Onychomycosis, , Pain in right toe, pain in left toes  Treatment & Plan Procedures and Treatment: Consent by patient was obtained for treatment procedures.   Debridement of mycotic and hypertrophic toenails, 1 through 5 bilateral and clearing of subungual debris. No ulceration, no infection noted. ABN signed in 2019. Return Visit-Office Procedure: Patient instructed to return to the office for a follow up visit 10 weeks  for continued evaluation and treatment.    Helane GuntherGregory Ivyrose Hashman DPM

## 2018-11-10 ENCOUNTER — Ambulatory Visit (INDEPENDENT_AMBULATORY_CARE_PROVIDER_SITE_OTHER): Payer: Medicare Other | Admitting: Family Medicine

## 2018-11-10 ENCOUNTER — Ambulatory Visit: Payer: Medicare Other | Admitting: Family Medicine

## 2018-11-10 ENCOUNTER — Encounter: Payer: Self-pay | Admitting: Family Medicine

## 2018-11-10 VITALS — BP 110/60 | HR 84 | Temp 98.4°F | Ht <= 58 in | Wt 108.2 lb

## 2018-11-10 DIAGNOSIS — R05 Cough: Secondary | ICD-10-CM | POA: Diagnosis not present

## 2018-11-10 DIAGNOSIS — R059 Cough, unspecified: Secondary | ICD-10-CM

## 2018-11-10 DIAGNOSIS — R52 Pain, unspecified: Secondary | ICD-10-CM

## 2018-11-10 DIAGNOSIS — J069 Acute upper respiratory infection, unspecified: Secondary | ICD-10-CM | POA: Diagnosis not present

## 2018-11-10 LAB — POC INFLUENZA A&B (BINAX/QUICKVUE)
INFLUENZA A, POC: NEGATIVE
INFLUENZA B, POC: NEGATIVE

## 2018-11-10 NOTE — Progress Notes (Signed)
Chief Complaint  Patient presents with  . Cough    x 7 days that has worsened over the last 2 days. Some chills and says whole body hurts and his chest. Did not have a flu shot this season.    Patient comes in along with his mother and younger sister for evaluation of cough.  He started out with cold/allergy symptoms--dry cough, sneezing, runny nose, itchy eyes. He has been coughing, started out as dry cough, but now sounds wetter (he swallows it, doesn't cough it up/spit it out).  He has slight decreased appetite, fatigue, laying around more.  No known fever, chills. No vomiting or diarrhea.  Denies ear pain. His sister was sick with a virus last week.  He took Tylenol cold and flu last night, didn't help much.  PMH, PSH, SH reviewed  Outpatient Encounter Medications as of 11/10/2018  Medication Sig  . levothyroxine (SYNTHROID) 100 MCG tablet Take 2 tabs by mouth Mon and Fri, 1 tab Tues/Wed/Thurs and none Sat/Sun  . omeprazole (PRILOSEC) 40 MG capsule Take by mouth.  Marland Kitchen. PE-DM-GG-APAP&PE-Doxyl-DM-APAP (TYLENOL COLD & FLU DAY/NIGHT) LQPK Take 30 mLs by mouth as needed.  Marland Kitchen. VITAMIN D, CHOLECALCIFEROL, PO Take by mouth.   No facility-administered encounter medications on file as of 11/10/2018.    No Known Allergies  ROS: No known fevers. +URI symptoms, per HPI. No vomiting, diarrhea, rash. No chest pain, shortness of breath, swelling or other concerns  PHYSICAL EXAM:  BP 110/60   Pulse 84   Temp 98.4 F (36.9 C) (Tympanic)   Ht 4' 9.5" (1.461 m)   Wt 108 lb 3.2 oz (49.1 kg)   BMI 23.01 kg/m   Well appearing, male adult, short stature and features consistent with his known Down's syndrome.  He is pleasant, in no distress today, in good spirits HEENT: conjunctiva and sclera are clear, EOMI.  Nose--mild edema, no purulence or erythema. Sinuses are nontender. OP is clear. Tm's and EAC's normal (right is partially obscured by cerumen) Neck: no lymphadenopathy or mass, no  thyromegaly Heart: regular rate and rhythm, no murmur Lungs: clear bilaterally, no wheezes, rales, ronchi Skin: mild acne on face, no rashes, normal turgur Neuro: alert.  Nonverbal today, answering questions with thumb up or down. He follows directions well.  Cranial nerves intact, normal gait Psych: appears to be in good spirits. Normal hygiene, and grooming. Normal eye contact.   Flu tests negative   ASSESSMENT/PLAN:   Viral upper respiratory tract infection  Cough - Plan: POC Influenza A&B (Binax test)  Body aches - Plan: POC Influenza A&B (Binax test)   Supportive measures reviewed. Will return when feeling a little better (next week) for flu shot.   Drink plenty of water. I suspect you have a virus, plus/minus allergies (more likely virus). Your lungs were clear I recommend getting guaifenesin to help loosen up the mucus. (this is found in robitussin, mucinex).   You may use a DM version of these (which contain dextromethorphan) if you aren't taking anything else that contains dextromethorphan. If you are continuing to use a multi-symptoms over-the-counter medication, check the ingredients, and do not use different medications that may overlap in ingredients (usually the dextromethorphan).  Return if fever, worsening cough, any shortness of breath, or other new changes develop.

## 2018-11-10 NOTE — Patient Instructions (Signed)
Drink plenty of water. I suspect you have a virus, plus/minus allergies (more likely virus). Your lungs were clear I recommend getting guaifenesin to help loosen up the mucus. (this is found in robitussin, mucinex).   You may use a DM version of these (which contain dextromethorphan) if you aren't taking anything else that contains dextromethorphan. If you are continuing to use a multi-symptoms over-the-counter medication, check the ingredients, and do not use different medications that may overlap in ingredients (usually the dextromethorphan).  Return if fever, worsening cough, any shortness of breath, or other new changes develop.

## 2018-11-23 ENCOUNTER — Encounter: Payer: Self-pay | Admitting: Podiatry

## 2018-11-23 ENCOUNTER — Ambulatory Visit (INDEPENDENT_AMBULATORY_CARE_PROVIDER_SITE_OTHER): Payer: Medicare Other | Admitting: Podiatry

## 2018-11-23 DIAGNOSIS — M79674 Pain in right toe(s): Secondary | ICD-10-CM

## 2018-11-23 DIAGNOSIS — T148XXA Other injury of unspecified body region, initial encounter: Secondary | ICD-10-CM

## 2018-11-23 DIAGNOSIS — M79675 Pain in left toe(s): Secondary | ICD-10-CM

## 2018-11-23 DIAGNOSIS — B351 Tinea unguium: Secondary | ICD-10-CM | POA: Diagnosis not present

## 2018-11-24 NOTE — Progress Notes (Signed)
Complaint:  Visit Type: Patient returns to my office for an evaluation of his second toe nail left foot.  He presents to the office with his mother.  His mother states that he injured his second toenail by kicking a bottle of water a few days ago.  She says the toenail started to bleed but has been improving the last few days.  He presents the office wearing a Band-Aid on his second toe.  This patient does have extremely elongated and thickened second third toenails on the left foot and fourth toenail right foot.  He presents the office today for an evaluation and treatment of his left foot.  Podiatric Exam: Vascular: dorsalis pedis and posterior tibial pulses are palpable bilateral. Capillary return is immediate. Temperature gradient is WNL. Skin turgor WNL  Sensorium: Normal Semmes Weinstein monofilament test. Normal tactile sensation bilaterally. Nail Exam: Pt has thick disfigured discolored nails with subungual debris noted bilateral entire nail hallux through fifth toenails.  There is bleeding noted under nail plate second toe left foot. No signs of redness or swelling or infection. Ulcer Exam: There is no evidence of ulcer or pre-ulcerative changes or infection. Orthopedic Exam: Muscle tone and strength are WNL. No limitations in general ROM. No crepitus or effusions noted. Foot type and digits show no abnormalities. Bony prominences are unremarkable. Skin: No Porokeratosis. No infection or ulcers  Diagnosis:  Onychomycosis  Hematoma second left.    Treatment & Plan Procedures and Treatment: Consent by patient was obtained for treatment procedures.   Debridement of mycotic  nails  especially second toenail left foot. Neosporin/DSD for hematoma  Return Visit-Office Procedure: Patient instructed to return to the office for a follow up visit 6 weeks  for continued evaluation and treatment.    Helane Gunther DPM

## 2018-11-28 NOTE — Patient Instructions (Addendum)
  HEALTH MAINTENANCE RECOMMENDATIONS:  It is recommended that you get at least 30 minutes of aerobic exercise at least 5 days/week (for weight loss, you may need as much as 60-90 minutes). This can be any activity that gets your heart rate up. This can be divided in 10-15 minute intervals if needed, but try and build up your endurance at least once a week.  Weight bearing exercise is also recommended twice weekly.  Eat a healthy diet with lots of vegetables, fruits and fiber.  "Colorful" foods have a lot of vitamins (ie green vegetables, tomatoes, red peppers, etc).  Limit sweet tea, regular sodas and alcoholic beverages, all of which has a lot of calories and sugar.  Up to 2 alcoholic drinks daily may be beneficial for men (unless trying to lose weight, watch sugars).  Drink a lot of water.  Sunscreen of at least SPF 30 should be used on all sun-exposed parts of the skin when outside between the hours of 10 am and 4 pm (not just when at beach or pool, but even with exercise, golf, tennis, and yard work!)  Use a sunscreen that says "broad spectrum" so it covers both UVA and UVB rays, and make sure to reapply every 1-2 hours.  Remember to change the batteries in your smoke detectors when changing your clock times in the spring and fall.  Use your seat belt every time you are in a car, and please drive safely and not be distracted with cell phones and texting while driving.  If sexually active, please be sure to use condoms (safe sex, to protect from getting sexually transmitted diseases and unwanted pregnancy).   Give him his 2000 IU of Vitamin D3 Monday through Friday, along with the omeprazole. Try and keep the Synthroid separate from vitamins, medications and food if possible.  Try and make sure the toes are staying dry. Use lotrimin or lamisil cream between the toe to treat an athlete's foot. Continue to see the podiatrist for the nails  Due to see Dr. Jac Canavan for follow-up on  hearing. Currently has a lot of wax in his ears.   We will refer to orthopedics for his hip pain, and for another sleep study.

## 2018-11-28 NOTE — Progress Notes (Signed)
Chief Complaint  Patient presents with  . Annual Exam    nonfasting annual exam. Right arm pain-making a sound like he is having a pulsating pain. Neck pain. Left hip pain. And left foot, toe where bandaid is is bothering him.     Ronald Le is a 28 y.o. male who presents for a complete physical.  He has the following concerns:  Right arm hurts when he is sleeping, like a pressure.  Feels better when he changes position.  His arm also falls asleep when it hurts, only at night. No problems with arm numbness or pain during the day.  Neck feels better. He is s/p PT last year for neck pain. He reports some sharp pains in the back when he is walking. Drinking water helps. Denies pain currently. No radiation of pain into the legs.  Also has some pain in his left hip which is chronic.  He is s/p surgery at that hip. He has pain when walking quickly outside. Pain is lateral; denies pain at groin.  He is first seen by himself; mother (and disruptive younger sister) joined later.  They gave VERY different histories.  When Jaiyon is by himself, he talks a lot--somewhat difficult to understand his speech.  When his mother is present, he speaks very little, usually just using thumbs up and thumbs down.  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  I asked if he had his license, he said yes, but mother reports he only has state ID, no driver's license, and doesn't drive the truck.  He also states that he is in love, sexually active.  I couldn't understand some of his answers when asking about whether using condoms. (this was not addressed with mother present).  Vitamin D deficiency: Last level was low at 18 in 12/2016. He was treated with 12 weeks of prescription vitamin D. He has currently been taking 200 IU, but very sporadically per mother.  H/o depression--last visit (04/2017) had been getting  counseling and moods were better.  He denies any depression currently.  He states that he continues to see therapist once a week. Mother reports they haven't been in a while, but plan to resume weekly therapy soon.  She reports being very concerned about him, that he lies all the time (and that much of what he told me was lies). That he isn't bathing (made him bathe prior to visit today, but states he put dirty clothes back on). States that he watches a lot of TV, and games on tablet.  Is up much of the night, and sleeps a lot during the day.   Hypothyroidism: Finally saw endocrinologist at [facility] 08/23/2018.  Prior to this visit, he hadn't been seen since 01/19/17. He was supposed to be taking Synthroid 100 mcg 6 days per week and 1/2 tab on the 7th day (decreased from Synthroid 100 mcg daily after TSH was low at 0.140), but admitted that he was taking his medications very inconsistently (4 times in the 20d prior to that visit). He was noted to have gained a few pounds, and was complaining of fatigue, constipation. TSH was not checked at that visit, but they discussed the following plan: His mother will help him take his medication now. His mother will give his Synthroid 100 mcg 2 tabs Mon and Fri and 1 tab Tues/Wed/Thurs since she already has a medication schedule for  her other child M-F . He will get no tabs Saturday and Sunday. He was to have TSH checked at a [facility] in 2 months. Mother states she thought labs were due the end of December, hasn't done yet. Reports she gives his thyroid medication along with his omeprazole.  OSA--patient reports compliance is using CPAP, that he is wearing his dad's.   Mother denies this--states he is NOT using CPAP (doesn't even think they have dad's anymore).  He denies feeling tired. Mother isn't aware of any snoring. She wonders if he could have grown out of the sleep apnea.  Last sleep study was maybe 5 years ago in [place] (no records available), possibly even longer  (as a teen).   He has had problems in the past with chest pain with eating/drinking and dysphagia. He has h/o achalasia and candidal esophagitis. Previously saw Dr. Paulita Fujita.  He denies any problems with this currently.   Immunization History  Administered Date(s) Administered  . DTaP 01/31/1992, 04/10/1992, 06/19/1992, 04/16/1993, 09/27/1996  . Hepatitis A 02/05/2009, 03/19/2011  . Hepatitis B 04/10/1992, 06/19/1992, 04/16/1993  . HiB (PRP-OMP) 01/31/1992, 04/10/1992, 06/19/1992, 04/16/1993  . IPV 01/31/1992, 04/10/1992, 04/16/1993, 09/27/1996  . Influenza Split 11/12/2006, 11/26/2007, 09/28/2008, 09/02/2012  . Influenza,inj,Quad PF,6+ Mos 09/08/2013, 08/31/2014, 10/03/2015, 10/08/2017  . MMR 10/01/1993, 09/27/1996  . Meningococcal Conjugate 02/05/2009  . Pneumococcal Polysaccharide-23 09/15/2013  . Td 11/12/2006  . Tdap 03/19/2011  . Varicella 09/25/1998, 11/26/2007   Dentist: just got coverage and needs to schedule Ophtho:  Yearly, scheduled later this month. Hearing screen: He previously had a hearing aid for his L ear, doesn't use it (and lost it when they moved, mom thinks he threw them away).  He denies hearing concerns. Previously went to [facility], and also saw Dr. Cresenciano Lick Exercise: Patient reports that he walks 2x/day, 1 mile, 5 times/week (15 min mile). +weight-lifting at the gym 2x/week. Mother says this is all lies--that he takes the dog out twice a day, stands there (no exercise), and that he doesn't go to a gym.  Lipids: Lab Results  Component Value Date   CHOL 157 10/03/2015   HDL 39 (L) 10/03/2015   LDLCALC 78 10/03/2015   TRIG 201 (H) 10/03/2015   CHOLHDL 4.0 10/03/2015  He is not fasting today (hadn't eaten, woke up late, but had regular soda).  Other doctors caring for this patient: Podiatrist: Dr. Prudence Davidson Endocrinologist: Dr. Malachy Mood Dentist: Ophtho: Dr. Annamaria Boots GI: Dr. Paulita Fujita ENT: Dr. Cresenciano Lick Audiologist: [facility] (had hearing  aid for left year, lost it when they moved, wasn't using it)  Past Medical History:  Diagnosis Date  . Allergy   . Candida esophagitis ([place]) 04/2012   after ABX for pneumonia  . Chronic serous otitis media   . Communication problem    "when he feels bad he just communicates w/thumbs up or down or shakes his head; very limited communication normally" (09/30/2012)  . Complication of anesthesia 08/2012   "hard to wake up after balloon dilatation)  . Complication of anesthesia 1999   "respiratory arrest after hip OR; had to get Narcan" (09/30/2012)  . Down's syndrome   . Dysphagia 09/30/2012   now resolved  . Finger fracture 2009   h/o  . GERD (gastroesophageal reflux disease)    "maybe" (09/30/2012)  . Heart murmur    "@ birth; resolved" (09/30/2012)  . Hypothyroidism   . Legg-Calve-Perthes disease   . OSA on CPAP    noncompliant with use of CPAP  .  Peristalsis    "almost absent" (09/30/2012)  . Pneumonia    multiple hospitalizatons as toddler, but 10 day stay age 51yo  . Viral syndrome 09/1995   hospital viral syndrome.    Past Surgical History:  Procedure Laterality Date  . ADENOIDECTOMY AND MYRINGOTOMY WITH TUBE PLACEMENT  11/1996   bilaterally  . BALLOON DILATION  09/01/2012   Procedure: BALLOON DILATION;  Surgeon: Arta Silence, MD;  Location: [facility] ENDOSCOPY;  Service: Endoscopy;  Laterality: N/A;  . ESOPHAGOGASTRODUODENOSCOPY  10/01/2012   Procedure: ESOPHAGOGASTRODUODENOSCOPY (EGD);  Surgeon: Winfield Cunas., MD;  Location: [facility] ENDOSCOPY;  Service: Endoscopy;  Laterality: N/A;  . ESOPHAGOGASTRODUODENOSCOPY  10/01/12  . ESOPHAGOGASTRODUODENOSCOPY (EGD) WITH PROPOFOL N/A 12/21/2013   Procedure: ESOPHAGOGASTRODUODENOSCOPY (EGD) WITH PROPOFOL;  Surgeon: Arta Silence, MD;  Location: [facility] ENDOSCOPY;  Service: Endoscopy;  Laterality: N/A;  . GUM SURGERY  ~ 2004   graft to gums from roof of mouth.  Marland Kitchen HIP ARTHROPLASTY  10/1998   left; due to Legg-Calve-Perthe dz and subsequent  surgery to remove hardware  . LAPAROSCOPIC HELLER MYOTOMY  11/11/12   Dr.Farrell  . laparoscopic surgery  11-16-13   . TYMPANOSTOMY TUBE PLACEMENT  11/1996   bilaterally    Social History   Socioeconomic History  . Marital status: Single    Spouse name: Not on file  . Number of children: Not on file  . Years of education: Not on file  . Highest education level: Not on file  Occupational History  . Not on file  Social Needs  . Financial resource strain: Not on file  . Food insecurity:    Worry: Not on file    Inability: Not on file  . Transportation needs:    Medical: Not on file    Non-medical: Not on file  Tobacco Use  . Smoking status: Never Smoker  . Smokeless tobacco: Never Used  Substance and Sexual Activity  . Alcohol use: Yes    Comment: 1 glass of white wine at parties per pt 11/2018  . Drug use: No  . Sexual activity: Yes    Partners: Female    Comment: per pt 11/2018  Lifestyle  . Physical activity:    Days per week: Not on file    Minutes per session: Not on file  . Stress: Not on file  Relationships  . Social connections:    Talks on phone: Not on file    Gets together: Not on file    Attends religious service: Not on file    Active member of club or organization: Not on file    Attends meetings of clubs or organizations: Not on file    Relationship status: Not on file  . Intimate partner violence:    Fear of current or ex partner: Not on file    Emotionally abused: Not on file    Physically abused: Not on file    Forced sexual activity: Not on file  Other Topics Concern  . Not on file  Social History Narrative   Lives with mom, younger sister. No tobacco exposure.     Family History  Problem Relation Age of Onset  . Irritable bowel syndrome Mother   . Interstitial cystitis Mother   . Fibromyalgia Mother   . Gallbladder disease Father        gallbladder surgery  . Migraines Sister   . Depression Sister        postpartum  . Alzheimer's  disease Maternal Grandmother  dementia.  . Pyloric stenosis Brother        as baby  . Hearing loss Maternal Grandfather        cochlear loss    Outpatient Encounter Medications as of 11/29/2018  Medication Sig Note  . levothyroxine (SYNTHROID) 100 MCG tablet Take 2 tabs by mouth Mon and Fri, 1 tab Tues/Wed/Thurs and none Sat/Sun   . omeprazole (PRILOSEC) 40 MG capsule Take by mouth.   Marland Kitchen VITAMIN D, CHOLECALCIFEROL, PO Take by mouth. 11/29/2018: Takes sporadically. 2000 IU daily  . [DISCONTINUED] PE-DM-GG-APAP&PE-Doxyl-DM-APAP (TYLENOL COLD & FLU DAY/NIGHT) LQPK Take 30 mLs by mouth as needed.    No facility-administered encounter medications on file as of 11/29/2018.     No Known Allergies   ROS: no fever, chills, headaches, weight changes, change in appetite. Denies exertional chest pain, shortness of breath. No nausea or vomiting.   Denies urinary complaints, bowel changes. Right arm discomfort per HPI--sounds like it falls asleep at night with certain positions, no other arm pain. +left hip pain and some low back pain. See HPI. No bleeding, bruising. Moods are better--denies depression, insomnia, anxiety. Denies pain with swallowing or dysphagia.   PHYSICAL EXAM:  BP 110/70   Pulse 80   Ht 4' 9.5" (1.461 m)   Wt 109 lb 6.4 oz (49.6 kg)   BMI 23.26 kg/m   Wt Readings from Last 3 Encounters:  11/10/18 108 lb 3.2 oz (49.1 kg)  04/30/17 109 lb 9.6 oz (49.7 kg)  01/21/17 110 lb 3.2 oz (50 kg)   Pleasant male, in good spirits, in no distress. +full beard/facial hair (which he is proud of).  He is much more talkative when in exam room without his mother/sister.  His speech is quiet, there is some stutter, and sometimes is hard to hear/understand. HEENT: PERRL, EOMI, conjunctiva clear. Fundi benign. TM's are partly obscured by cerumen bilaterally. Visualized portions are normal. EACs normal. Nasal mucosa is normal. OP is clear. There is some mild dandruff noted (a few flakes in  hair), but scalp is normal. Neck:/spine: spine is nontender. No CVA tenderness or SI tenderness.  No lymphadenopathy, thyromegaly or mass. Heart: regular rate and rhythm without murmur Chest wall nontender to palpation Lungs: clear bilaterally Abdomen: normal bowel sounds, soft, nontender, no organomegaly or mass. GU: normal genitalia without lesions or hernias. Testicles are descended bilaterally, no masses Skin: normal turgor, no rashes or bruising.  Psych: normal mood, affect, hygiene and grooming. Normal eye contact. Neuro: alert. Cranial nerves intact. Normal strength, sensation, gait Extremities: normal pulses, no edema.  Thickened, onychomycotic nails throughout. There is also some maceration noted between toes.     ASSESSMENT/PLAN:  Annual physical exam - Plan: POCT Urinalysis DIP (Proadvantage Device), CBC with Differential/Platelet, Lipid panel, VITAMIN D 25 Hydroxy (Vit-D Deficiency, Fractures), Comprehensive metabolic panel, TSH  Vitamin D deficiency - noncompliant with supplements, suspect will be very low. Recheck  - Plan: VITAMIN D 25 Hydroxy (Vit-D Deficiency, Fractures)  Down's syndrome  OSA (obstructive sleep apnea) - noncompliant with use of CPAP.  Last study many years ago. ?if still needs (has been resistant).  Rec repeat testing - Plan: Home sleep test  Other specified hypothyroidism - noncompliant last year with treatment. Mother now assisting with giving meds (but too close to other meds); past due for recheck with endo - Plan: TSH  Other fatigue - ?if related to suboptimally treated thyroid disease, poor sleep habits, and/or OSA, as well as low D levels.  r/o other causes -  Plan: CBC with Differential/Platelet, VITAMIN D 25 Hydroxy (Vit-D Deficiency, Fractures), Comprehensive metabolic panel, TSH  Need for influenza vaccination - Plan: Flu Vaccine QUAD 6+ mos PF IM (Fluarix Quad PF)  Left hip pain - Plan: Ambulatory referral to Orthopedic Surgery  Tinea pedis  of both feet - discussed importance of drying between toes; OTC antifungal creams BID, use of powder.  Onychomycosis - thickened and sometimes pain--encouraged routine f/u with podiatrist  Patient was counseled re: healthy diet, exercise recommendations, safe sex. Along with mother, counseled on taking medications properly, adequate sleep, limited screen time. As far as concerns regarding lying all the time, poor hygiene, and too much screen time, suggest they discuss these issues with therapist.  Mother seems very frustrated with Merel's care of himself and lies.   D, lipids are due. Had mountain dew prior to visit (not fasting). Will return for fasting labs (given that TG elevated on last check)  Mother reminded that he is due for TSH recheck.  Recommended at least 30 minutes of aerobic activity at least 5 days/week; proper sunscreen use reviewed; healthy diet reviewed; regular seatbelt use; Self-testicular exams. Immunizations are UTD.  Tried to discuss safe sex with patient (for prevention of STD's and pregnancy), though seriously question whether or not he is in a relationship. He told many untruths today, refuted by his mother, but this was NOT discussed with mother.  F/u 1 year, sooner prn F/u with endocrinologist (due now for labs)   Give him his 2000 IU of Vitamin D3 Monday through Friday, along with the omeprazole. Try and keep the Synthroid separate from vitamins, medications and food if possible.  Try and make sure the toes are staying dry. Use lotrimin or lamisil cream between the toe to treat an athlete's foot. Continue to see the podiatrist for the nails  Due to see Dr. Cresenciano Lick for follow-up on hearing. Currently has a lot of wax in his ears.

## 2018-11-29 ENCOUNTER — Encounter: Payer: Self-pay | Admitting: Family Medicine

## 2018-11-29 ENCOUNTER — Ambulatory Visit (INDEPENDENT_AMBULATORY_CARE_PROVIDER_SITE_OTHER): Payer: Medicare Other | Admitting: Family Medicine

## 2018-11-29 VITALS — BP 110/70 | HR 80 | Ht <= 58 in | Wt 109.4 lb

## 2018-11-29 DIAGNOSIS — Z0000 Encounter for general adult medical examination without abnormal findings: Secondary | ICD-10-CM

## 2018-11-29 DIAGNOSIS — B351 Tinea unguium: Secondary | ICD-10-CM

## 2018-11-29 DIAGNOSIS — G4733 Obstructive sleep apnea (adult) (pediatric): Secondary | ICD-10-CM

## 2018-11-29 DIAGNOSIS — R5383 Other fatigue: Secondary | ICD-10-CM

## 2018-11-29 DIAGNOSIS — Z23 Encounter for immunization: Secondary | ICD-10-CM

## 2018-11-29 DIAGNOSIS — M25552 Pain in left hip: Secondary | ICD-10-CM

## 2018-11-29 DIAGNOSIS — Q909 Down syndrome, unspecified: Secondary | ICD-10-CM

## 2018-11-29 DIAGNOSIS — E038 Other specified hypothyroidism: Secondary | ICD-10-CM

## 2018-11-29 DIAGNOSIS — E559 Vitamin D deficiency, unspecified: Secondary | ICD-10-CM | POA: Diagnosis not present

## 2018-11-29 DIAGNOSIS — B353 Tinea pedis: Secondary | ICD-10-CM

## 2018-11-29 LAB — POCT URINALYSIS DIP (PROADVANTAGE DEVICE)
Bilirubin, UA: NEGATIVE
Blood, UA: NEGATIVE
Glucose, UA: NEGATIVE mg/dL
Ketones, POC UA: NEGATIVE mg/dL
Leukocytes, UA: NEGATIVE
Nitrite, UA: NEGATIVE
PH UA: 5.5 (ref 5.0–8.0)
PROTEIN UA: NEGATIVE mg/dL
Specific Gravity, Urine: 1.025
Urobilinogen, Ur: NEGATIVE

## 2018-11-30 ENCOUNTER — Other Ambulatory Visit: Payer: Medicare Other

## 2018-11-30 ENCOUNTER — Encounter: Payer: Self-pay | Admitting: Family Medicine

## 2018-11-30 DIAGNOSIS — Z0000 Encounter for general adult medical examination without abnormal findings: Secondary | ICD-10-CM

## 2018-11-30 DIAGNOSIS — R5383 Other fatigue: Secondary | ICD-10-CM

## 2018-11-30 DIAGNOSIS — E559 Vitamin D deficiency, unspecified: Secondary | ICD-10-CM | POA: Diagnosis not present

## 2018-11-30 DIAGNOSIS — E038 Other specified hypothyroidism: Secondary | ICD-10-CM

## 2018-12-01 LAB — CBC WITH DIFFERENTIAL/PLATELET
Basophils Absolute: 0.1 10*3/uL (ref 0.0–0.2)
Basos: 1 %
EOS (ABSOLUTE): 0.1 10*3/uL (ref 0.0–0.4)
EOS: 2 %
Hematocrit: 44.9 % (ref 37.5–51.0)
Hemoglobin: 15.6 g/dL (ref 13.0–17.7)
Immature Grans (Abs): 0 10*3/uL (ref 0.0–0.1)
Immature Granulocytes: 0 %
LYMPHS ABS: 1.1 10*3/uL (ref 0.7–3.1)
Lymphs: 18 %
MCH: 31.5 pg (ref 26.6–33.0)
MCHC: 34.7 g/dL (ref 31.5–35.7)
MCV: 91 fL (ref 79–97)
Monocytes Absolute: 0.7 10*3/uL (ref 0.1–0.9)
Monocytes: 11 %
Neutrophils Absolute: 4.4 10*3/uL (ref 1.4–7.0)
Neutrophils: 68 %
Platelets: 232 10*3/uL (ref 150–450)
RBC: 4.96 x10E6/uL (ref 4.14–5.80)
RDW: 13 % (ref 11.6–15.4)
WBC: 6.4 10*3/uL (ref 3.4–10.8)

## 2018-12-01 LAB — COMPREHENSIVE METABOLIC PANEL
ALBUMIN: 4.1 g/dL (ref 3.5–5.5)
ALK PHOS: 72 IU/L (ref 39–117)
ALT: 13 IU/L (ref 0–44)
AST: 15 IU/L (ref 0–40)
Albumin/Globulin Ratio: 1.5 (ref 1.2–2.2)
BUN / CREAT RATIO: 11 (ref 9–20)
BUN: 10 mg/dL (ref 6–20)
Bilirubin Total: 0.5 mg/dL (ref 0.0–1.2)
CALCIUM: 9.1 mg/dL (ref 8.7–10.2)
CO2: 27 mmol/L (ref 20–29)
CREATININE: 0.91 mg/dL (ref 0.76–1.27)
Chloride: 100 mmol/L (ref 96–106)
GFR, EST AFRICAN AMERICAN: 133 mL/min/{1.73_m2} (ref >59)
GFR, EST NON AFRICAN AMERICAN: 115 mL/min/{1.73_m2} (ref >59)
GLOBULIN, TOTAL: 2.8 g/dL (ref 1.5–4.5)
GLUCOSE: 92 mg/dL (ref 65–99)
Potassium: 4.1 mmol/L (ref 3.5–5.2)
SODIUM: 141 mmol/L (ref 134–144)
TOTAL PROTEIN: 6.9 g/dL (ref 6.0–8.5)

## 2018-12-01 LAB — LIPID PANEL
Chol/HDL Ratio: 3.1 ratio (ref 0.0–5.0)
Cholesterol, Total: 120 mg/dL (ref 100–199)
HDL: 39 mg/dL — AB (ref >39)
LDL CALC: 61 mg/dL (ref 0–99)
Triglycerides: 101 mg/dL (ref 0–149)
VLDL Cholesterol Cal: 20 mg/dL (ref 5–40)

## 2018-12-01 LAB — TSH: TSH: 0.015 u[IU]/mL — ABNORMAL LOW (ref 0.450–4.500)

## 2018-12-01 LAB — VITAMIN D 25 HYDROXY (VIT D DEFICIENCY, FRACTURES): VIT D 25 HYDROXY: 25.2 ng/mL — AB (ref 30.0–100.0)

## 2018-12-08 ENCOUNTER — Ambulatory Visit (INDEPENDENT_AMBULATORY_CARE_PROVIDER_SITE_OTHER): Payer: Medicare Other | Admitting: Orthopaedic Surgery

## 2018-12-22 ENCOUNTER — Encounter: Payer: Self-pay | Admitting: Podiatry

## 2018-12-22 ENCOUNTER — Ambulatory Visit (INDEPENDENT_AMBULATORY_CARE_PROVIDER_SITE_OTHER): Payer: Medicare Other | Admitting: Podiatry

## 2018-12-22 DIAGNOSIS — M79675 Pain in left toe(s): Secondary | ICD-10-CM | POA: Diagnosis not present

## 2018-12-22 DIAGNOSIS — B351 Tinea unguium: Secondary | ICD-10-CM | POA: Diagnosis not present

## 2018-12-22 DIAGNOSIS — M79674 Pain in right toe(s): Secondary | ICD-10-CM | POA: Diagnosis not present

## 2018-12-22 NOTE — Progress Notes (Signed)
Complaint:  Visit Type: Patient returns to my office for continued preventative foot care services. Complaint: Patient states" my nails have grown long and thick and become painful to walk and wear shoes" Patient has not been seen for over 5 months. The patient presents for preventative foot care services. No changes to ROS  Podiatric Exam: Vascular: dorsalis pedis and posterior tibial pulses are palpable bilateral. Capillary return is immediate. Temperature gradient is WNL. Skin turgor WNL  Sensorium: Normal Semmes Weinstein monofilament test. Normal tactile sensation bilaterally. Nail Exam: Pt has thick disfigured discolored nails with subungual debris noted bilateral entire nail hallux through fifth toenails Ulcer Exam: There is no evidence of ulcer or pre-ulcerative changes or infection. Orthopedic Exam: Muscle tone and strength are WNL. No limitations in general ROM. No crepitus or effusions noted. Foot type and digits show no abnormalities. Bony prominences are unremarkable. Skin: No Porokeratosis. No infection or ulcers  Diagnosis:  Onychomycosis, , Pain in right toe, pain in left toes  Treatment & Plan Procedures and Treatment: Consent by patient was obtained for treatment procedures.   Debridement of mycotic and hypertrophic toenails, 1 through 5 bilateral and clearing of subungual debris. No ulceration, no infection noted.  Return Visit-Office Procedure: Patient instructed to return to the office for a follow up visit 3 months for continued evaluation and treatment.    Helane Gunther DPM

## 2018-12-28 ENCOUNTER — Ambulatory Visit (HOSPITAL_BASED_OUTPATIENT_CLINIC_OR_DEPARTMENT_OTHER): Payer: Medicare Other | Admitting: Internal Medicine

## 2018-12-28 VITALS — Ht <= 58 in | Wt 110.0 lb

## 2018-12-29 ENCOUNTER — Other Ambulatory Visit: Payer: Self-pay | Admitting: *Deleted

## 2018-12-29 DIAGNOSIS — G4733 Obstructive sleep apnea (adult) (pediatric): Secondary | ICD-10-CM

## 2019-01-19 ENCOUNTER — Ambulatory Visit (HOSPITAL_BASED_OUTPATIENT_CLINIC_OR_DEPARTMENT_OTHER): Payer: Medicare Other | Attending: Family Medicine

## 2019-02-01 ENCOUNTER — Encounter: Payer: Self-pay | Admitting: Podiatry

## 2019-02-01 ENCOUNTER — Ambulatory Visit (INDEPENDENT_AMBULATORY_CARE_PROVIDER_SITE_OTHER): Payer: Medicare Other | Admitting: Podiatry

## 2019-02-01 DIAGNOSIS — M79675 Pain in left toe(s): Secondary | ICD-10-CM | POA: Diagnosis not present

## 2019-02-01 DIAGNOSIS — M79674 Pain in right toe(s): Secondary | ICD-10-CM

## 2019-02-01 DIAGNOSIS — B351 Tinea unguium: Secondary | ICD-10-CM

## 2019-02-01 NOTE — Progress Notes (Signed)
Complaint:  Visit Type: Patient returns to my office for continued preventative foot care services. Complaint: Patient states" my nails have grown long and thick and become painful to walk and wear shoes" Patient has not been seen for over 5 months. The patient presents for preventative foot care services. No changes to ROS  Podiatric Exam: Vascular: dorsalis pedis and posterior tibial pulses are palpable bilateral. Capillary return is immediate. Temperature gradient is WNL. Skin turgor WNL  Sensorium: Normal Semmes Weinstein monofilament test. Normal tactile sensation bilaterally. Nail Exam: Pt has thick disfigured discolored nails with subungual debris noted bilateral entire nail hallux through fifth toenails Ulcer Exam: There is no evidence of ulcer or pre-ulcerative changes or infection. Orthopedic Exam: Muscle tone and strength are WNL. No limitations in general ROM. No crepitus or effusions noted. Foot type and digits show no abnormalities. Bony prominences are unremarkable. Skin: No Porokeratosis. No infection or ulcers  Diagnosis:  Onychomycosis, , Pain in right toe, pain in left toes  Treatment & Plan Procedures and Treatment: Consent by patient was obtained for treatment procedures.   Debridement of mycotic and hypertrophic toenails, 1 through 5 bilateral and clearing of subungual debris. No ulceration, no infection noted.  Return Visit-Office Procedure: Patient instructed to return to the office for a follow up visit 6 weeks  for continued evaluation and treatment.    Travanti Mcmanus DPM 

## 2019-02-02 ENCOUNTER — Telehealth: Payer: Self-pay | Admitting: Podiatry

## 2019-02-02 NOTE — Telephone Encounter (Signed)
error 

## 2019-03-09 ENCOUNTER — Ambulatory Visit: Payer: Medicare Other | Admitting: Podiatry

## 2019-04-06 ENCOUNTER — Encounter: Payer: Self-pay | Admitting: Podiatry

## 2019-04-06 ENCOUNTER — Other Ambulatory Visit: Payer: Self-pay

## 2019-04-06 ENCOUNTER — Ambulatory Visit (INDEPENDENT_AMBULATORY_CARE_PROVIDER_SITE_OTHER): Payer: Medicare Other | Admitting: Podiatry

## 2019-04-06 VITALS — Temp 97.7°F

## 2019-04-06 DIAGNOSIS — M79674 Pain in right toe(s): Secondary | ICD-10-CM

## 2019-04-06 DIAGNOSIS — B351 Tinea unguium: Secondary | ICD-10-CM

## 2019-04-06 DIAGNOSIS — M79675 Pain in left toe(s): Secondary | ICD-10-CM | POA: Diagnosis not present

## 2019-04-06 NOTE — Progress Notes (Signed)
Complaint:  Visit Type: Patient returns to my office for continued preventative foot care services. Complaint: Patient states" my nails have grown long and thick and become painful to walk and wear shoes" Patient has not been seen for over 5 months. The patient presents for preventative foot care services. No changes to ROS  Podiatric Exam: Vascular: dorsalis pedis and posterior tibial pulses are palpable bilateral. Capillary return is immediate. Temperature gradient is WNL. Skin turgor WNL  Sensorium: Normal Semmes Weinstein monofilament test. Normal tactile sensation bilaterally. Nail Exam: Pt has thick disfigured discolored nails with subungual debris noted bilateral entire nail hallux through fifth toenails Ulcer Exam: There is no evidence of ulcer or pre-ulcerative changes or infection. Orthopedic Exam: Muscle tone and strength are WNL. No limitations in general ROM. No crepitus or effusions noted. Foot type and digits show no abnormalities. Bony prominences are unremarkable. Skin: No Porokeratosis. No infection or ulcers  Diagnosis:  Onychomycosis, , Pain in right toe, pain in left toes  Treatment & Plan Procedures and Treatment: Consent by patient was obtained for treatment procedures.   Debridement of mycotic and hypertrophic toenails, 1 through 5 bilateral and clearing of subungual debris. No ulceration, no infection noted.  Return Visit-Office Procedure: Patient instructed to return to the office for a follow up visit 6 weeks  for continued evaluation and treatment.    Delayni Streed DPM 

## 2019-05-18 ENCOUNTER — Ambulatory Visit: Payer: Medicare Other | Admitting: Podiatry

## 2019-06-14 ENCOUNTER — Ambulatory Visit: Payer: Medicare Other | Admitting: Podiatry

## 2019-06-22 ENCOUNTER — Ambulatory Visit: Payer: Medicare Other | Admitting: Podiatry

## 2019-07-27 ENCOUNTER — Other Ambulatory Visit: Payer: Self-pay

## 2019-07-27 ENCOUNTER — Encounter: Payer: Self-pay | Admitting: Podiatry

## 2019-07-27 ENCOUNTER — Ambulatory Visit (INDEPENDENT_AMBULATORY_CARE_PROVIDER_SITE_OTHER): Payer: Medicare Other | Admitting: Podiatry

## 2019-07-27 DIAGNOSIS — M79675 Pain in left toe(s): Secondary | ICD-10-CM

## 2019-07-27 DIAGNOSIS — B351 Tinea unguium: Secondary | ICD-10-CM | POA: Diagnosis not present

## 2019-07-27 DIAGNOSIS — M79674 Pain in right toe(s): Secondary | ICD-10-CM | POA: Diagnosis not present

## 2019-07-27 NOTE — Progress Notes (Signed)
Complaint:  Visit Type: Patient returns to my office for continued preventative foot care services. Complaint: Patient states" my nails have grown long and thick and become painful to walk and wear shoes" Patient has not been seen for over 5 months. The patient presents for preventative foot care services. No changes to ROS  Podiatric Exam: Vascular: dorsalis pedis and posterior tibial pulses are palpable bilateral. Capillary return is immediate. Temperature gradient is WNL. Skin turgor WNL  Sensorium: Normal Semmes Weinstein monofilament test. Normal tactile sensation bilaterally. Nail Exam: Pt has thick disfigured discolored nails with subungual debris noted bilateral entire nail hallux through fifth toenails Ulcer Exam: There is no evidence of ulcer or pre-ulcerative changes or infection. Orthopedic Exam: Muscle tone and strength are WNL. No limitations in general ROM. No crepitus or effusions noted. Foot type and digits show no abnormalities. Bony prominences are unremarkable. Skin: No Porokeratosis. No infection or ulcers  Diagnosis:  Onychomycosis, , Pain in right toe, pain in left toes  Treatment & Plan Procedures and Treatment: Consent by patient was obtained for treatment procedures.   Debridement of mycotic and hypertrophic toenails, 1 through 5 bilateral and clearing of subungual debris. No ulceration, no infection noted.  Return Visit-Office Procedure: Patient instructed to return to the office for a follow up visit 6 weeks  for continued evaluation and treatment.    Gardiner Barefoot DPM

## 2019-09-06 ENCOUNTER — Other Ambulatory Visit: Payer: Self-pay

## 2019-09-06 ENCOUNTER — Ambulatory Visit (INDEPENDENT_AMBULATORY_CARE_PROVIDER_SITE_OTHER): Payer: Medicare Other | Admitting: Podiatry

## 2019-09-06 ENCOUNTER — Encounter: Payer: Self-pay | Admitting: Podiatry

## 2019-09-06 DIAGNOSIS — M79675 Pain in left toe(s): Secondary | ICD-10-CM | POA: Diagnosis not present

## 2019-09-06 DIAGNOSIS — B351 Tinea unguium: Secondary | ICD-10-CM

## 2019-09-06 DIAGNOSIS — M79674 Pain in right toe(s): Secondary | ICD-10-CM | POA: Diagnosis not present

## 2019-09-06 NOTE — Progress Notes (Signed)
Complaint:  Visit Type: Patient returns to my office for continued preventative foot care services. Complaint: Patient states" my nails have grown long and thick and become painful to walk and wear shoes" Patient has not been seen for over 5 months. The patient presents for preventative foot care services. No changes to ROS  Podiatric Exam: Vascular: dorsalis pedis and posterior tibial pulses are palpable bilateral. Capillary return is immediate. Temperature gradient is WNL. Skin turgor WNL  Sensorium: Normal Semmes Weinstein monofilament test. Normal tactile sensation bilaterally. Nail Exam: Pt has thick disfigured discolored nails with subungual debris noted bilateral entire nail hallux through fifth toenails Ulcer Exam: There is no evidence of ulcer or pre-ulcerative changes or infection. Orthopedic Exam: Muscle tone and strength are WNL. No limitations in general ROM. No crepitus or effusions noted. Foot type and digits show no abnormalities. Bony prominences are unremarkable. Skin: No Porokeratosis. No infection or ulcers  Diagnosis:  Onychomycosis, , Pain in right toe, pain in left toes  Treatment & Plan Procedures and Treatment: Consent by patient was obtained for treatment procedures.   Debridement of mycotic and hypertrophic toenails, 1 through 5 bilateral and clearing of subungual debris. No ulceration, no infection noted.  Return Visit-Office Procedure: Patient instructed to return to the office for a follow up visit 6 weeks  for continued evaluation and treatment.    Gardiner Barefoot DPM

## 2019-10-18 ENCOUNTER — Ambulatory Visit: Payer: Medicare Other | Admitting: Podiatry
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
## 2019-11-29 NOTE — Patient Instructions (Addendum)
  HEALTH MAINTENANCE RECOMMENDATIONS:  It is recommended that you get at least 30 minutes of aerobic exercise at least 5 days/week (for weight loss, you may need as much as 60-90 minutes). This can be any activity that gets your heart rate up. This can be divided in 10-15 minute intervals if needed, but try and build up your endurance at least once a week.  Weight bearing exercise is also recommended twice weekly.  Eat a healthy diet with lots of vegetables, fruits and fiber.  "Colorful" foods have a lot of vitamins (ie green vegetables, tomatoes, red peppers, etc).  Limit sweet tea, regular sodas and alcoholic beverages, all of which has a lot of calories and sugar.  Up to 2 alcoholic drinks daily may be beneficial for men (unless trying to lose weight, watch sugars).  Drink a lot of water.  Sunscreen of at least SPF 30 should be used on all sun-exposed parts of the skin when outside between the hours of 10 am and 4 pm (not just when at beach or pool, but even with exercise, golf, tennis, and yard work!)  Use a sunscreen that says "broad spectrum" so it covers both UVA and UVB rays, and make sure to reapply every 1-2 hours.  Remember to change the batteries in your smoke detectors when changing your clock times in the spring and fall.  Use your seat belt every time you are in a car, and please drive safely and not be distracted with cell phones and texting while driving.  It is important to treat sleep apnea.  Untreated sleep apnea can contribute to many health concerns, affecting the heart.  We will refer again for the sleep study.  Please make sure this gets done.  Please schedule an appointment with a dentist.  Please go back to the podiatrist to have your nails cut, they are very thick and long.  Be sure to see the endocrinologist--they should be able to see the lab results from today.  Go to [facility] imaging for x-rays of the left hip.  We may need to send you to a local orthopedist,  depending on the results.  Please reschedule eye exam. He has wax in his ears--if having worsening hearing or pain, it should be removed.

## 2019-11-30 ENCOUNTER — Other Ambulatory Visit: Payer: Self-pay

## 2019-11-30 ENCOUNTER — Encounter: Payer: Self-pay | Admitting: Family Medicine

## 2019-11-30 ENCOUNTER — Ambulatory Visit (INDEPENDENT_AMBULATORY_CARE_PROVIDER_SITE_OTHER): Payer: Medicare Other | Admitting: Family Medicine

## 2019-11-30 VITALS — BP 102/68 | HR 84 | Temp 98.7°F | Ht <= 58 in | Wt 116.4 lb

## 2019-11-30 DIAGNOSIS — G4733 Obstructive sleep apnea (adult) (pediatric): Secondary | ICD-10-CM

## 2019-11-30 DIAGNOSIS — E038 Other specified hypothyroidism: Secondary | ICD-10-CM | POA: Diagnosis not present

## 2019-11-30 DIAGNOSIS — E559 Vitamin D deficiency, unspecified: Secondary | ICD-10-CM

## 2019-11-30 DIAGNOSIS — M25552 Pain in left hip: Secondary | ICD-10-CM

## 2019-11-30 DIAGNOSIS — Z23 Encounter for immunization: Secondary | ICD-10-CM

## 2019-11-30 DIAGNOSIS — Q909 Down syndrome, unspecified: Secondary | ICD-10-CM | POA: Diagnosis not present

## 2019-11-30 DIAGNOSIS — Z0000 Encounter for general adult medical examination without abnormal findings: Secondary | ICD-10-CM | POA: Diagnosis not present

## 2019-12-01 ENCOUNTER — Other Ambulatory Visit: Payer: Self-pay | Admitting: *Deleted

## 2019-12-01 LAB — TSH: TSH: 2.4 u[IU]/mL (ref 0.450–4.500)

## 2019-12-01 LAB — VITAMIN D 25 HYDROXY (VIT D DEFICIENCY, FRACTURES): Vit D, 25-Hydroxy: 21 ng/mL — ABNORMAL LOW (ref 30.0–100.0)

## 2019-12-01 MED ORDER — ERGOCALCIFEROL 1.25 MG (50000 UT) PO CAPS: 50000.0000 [IU] | ORAL_CAPSULE | ORAL | 0 refills | Status: DC

## 2019-12-01 NOTE — Progress Notes (Signed)
Please mail mom copies of labs.  Advise her that thyroid is fine, but vitamin D is low.  Please send in rx for 50,000 IU weekly x 12 weeks.

## 2019-12-15 DIAGNOSIS — E039 Hypothyroidism, unspecified: Secondary | ICD-10-CM | POA: Diagnosis not present

## 2019-12-23 ENCOUNTER — Ambulatory Visit: Payer: Medicare Other | Admitting: Podiatry

## 2020-01-23 ENCOUNTER — Ambulatory Visit: Payer: Medicare Other | Admitting: Podiatry

## 2020-01-25 ENCOUNTER — Other Ambulatory Visit: Payer: Self-pay

## 2020-01-25 ENCOUNTER — Encounter: Payer: Self-pay | Admitting: Podiatry

## 2020-01-25 ENCOUNTER — Ambulatory Visit (INDEPENDENT_AMBULATORY_CARE_PROVIDER_SITE_OTHER): Payer: Medicare Other | Admitting: Podiatry

## 2020-01-25 VITALS — Temp 96.3°F

## 2020-01-25 DIAGNOSIS — B351 Tinea unguium: Secondary | ICD-10-CM | POA: Diagnosis not present

## 2020-01-25 DIAGNOSIS — M79675 Pain in left toe(s): Secondary | ICD-10-CM

## 2020-01-25 DIAGNOSIS — M79674 Pain in right toe(s): Secondary | ICD-10-CM

## 2020-01-25 NOTE — Progress Notes (Signed)
Complaint:  Visit Type: Patient returns to my office for continued preventative foot care services. Complaint: Patient states" my nails have grown long and thick and become painful to walk and wear shoes" Patient has not been seen for over 5 months. The patient presents for preventative foot care services. No changes to ROS  Podiatric Exam: Vascular: dorsalis pedis and posterior tibial pulses are palpable bilateral. Capillary return is immediate. Temperature gradient is WNL. Skin turgor WNL  Sensorium: Normal Semmes Weinstein monofilament test. Normal tactile sensation bilaterally. Nail Exam: Pt has thick disfigured discolored nails with subungual debris noted bilateral entire nail hallux through fifth toenails Ulcer Exam: There is no evidence of ulcer or pre-ulcerative changes or infection. Orthopedic Exam: Muscle tone and strength are WNL. No limitations in general ROM. No crepitus or effusions noted. Foot type and digits show no abnormalities. HAV  B/L. Skin: No Porokeratosis. No infection or ulcers  Diagnosis:  Onychomycosis, , Pain in right toe, pain in left toes  Treatment & Plan Procedures and Treatment: Consent by patient was obtained for treatment procedures.   Debridement of mycotic and hypertrophic toenails, 1 through 5 bilateral and clearing of subungual debris. No ulceration, no infection noted.  Return Visit-Office Procedure: Patient instructed to return to the office for a follow up visit 6 weeks  for continued evaluation and treatment.    Helane Gunther DPM

## 2020-02-06 ENCOUNTER — Ambulatory Visit (HOSPITAL_BASED_OUTPATIENT_CLINIC_OR_DEPARTMENT_OTHER): Payer: Medicare Other | Admitting: Internal Medicine

## 2020-02-16 ENCOUNTER — Other Ambulatory Visit: Payer: Self-pay | Admitting: Family Medicine

## 2020-03-07 ENCOUNTER — Ambulatory Visit: Payer: Medicare Other | Admitting: Podiatry

## 2020-05-03 ENCOUNTER — Other Ambulatory Visit: Payer: Self-pay | Admitting: Family Medicine

## 2020-05-07 ENCOUNTER — Telehealth: Payer: Self-pay

## 2020-05-07 NOTE — Telephone Encounter (Signed)
Left message on mom's voice mail.

## 2020-05-07 NOTE — Telephone Encounter (Signed)
Pt. Mom called stating that he needs a refill sent in on his omeprazole pt. Last apt. Was 11/30/19 MWV.

## 2020-05-07 NOTE — Telephone Encounter (Signed)
I have never prescribed this.  He must have gotten from GI (Dr. Dulce Sellar), if not getting it OTC

## 2020-05-07 NOTE — Telephone Encounter (Signed)
Is this okay?

## 2020-05-16 ENCOUNTER — Encounter: Payer: Self-pay | Admitting: Podiatry

## 2020-05-16 ENCOUNTER — Ambulatory Visit (INDEPENDENT_AMBULATORY_CARE_PROVIDER_SITE_OTHER): Payer: Medicare Other | Admitting: Podiatry

## 2020-05-16 ENCOUNTER — Telehealth: Payer: Self-pay | Admitting: Family Medicine

## 2020-05-16 ENCOUNTER — Other Ambulatory Visit: Payer: Self-pay

## 2020-05-16 DIAGNOSIS — M79674 Pain in right toe(s): Secondary | ICD-10-CM | POA: Diagnosis not present

## 2020-05-16 DIAGNOSIS — M79675 Pain in left toe(s): Secondary | ICD-10-CM | POA: Diagnosis not present

## 2020-05-16 DIAGNOSIS — B351 Tinea unguium: Secondary | ICD-10-CM

## 2020-05-16 NOTE — Telephone Encounter (Signed)
Pt mom called and would like to go have a xray of the left hip she never took him to go get it done. Informed pt that you was out of the office today,

## 2020-05-16 NOTE — Progress Notes (Signed)
Complaint:  Visit Type: Patient returns to my office for continued preventative foot care services. Complaint: Patient states" my nails have grown long and thick and become painful to walk and wear shoes"  The patient presents for preventative foot care services. No changes to ROS. Patient has Downs syndrome and is accompanied by his mother.  Podiatric Exam: Vascular: dorsalis pedis and posterior tibial pulses are palpable bilateral. Capillary return is immediate. Temperature gradient is WNL. Skin turgor WNL  Sensorium: Normal Semmes Weinstein monofilament test. Normal tactile sensation bilaterally. Nail Exam: Pt has thick disfigured discolored nails with subungual debris noted bilateral entire nail hallux through fifth toenails Ulcer Exam: There is no evidence of ulcer or pre-ulcerative changes or infection. Orthopedic Exam: Muscle tone and strength are WNL. No limitations in general ROM. No crepitus or effusions noted. Foot type and digits show no abnormalities. HAV  B/L. Skin: No Porokeratosis. No infection or ulcers  Diagnosis:  Onychomycosis, , Pain in right toe, pain in left toes  Treatment & Plan Procedures and Treatment: Consent by patient was obtained for treatment procedures.   Debridement of mycotic and hypertrophic toenails, 1 through 5 bilateral and clearing of subungual debris. No ulceration, no infection noted.  Return Visit-Office Procedure: Patient instructed to return to the office for a follow up visit 6 weeks  for continued evaluation and treatment.    Helane Gunther DPM

## 2020-05-20 NOTE — Telephone Encounter (Signed)
It was ordered in January, and doesn't expire until 01/2021.  They can just go to [facility]. Please advise.

## 2020-05-21 NOTE — Telephone Encounter (Signed)
Called  and left pt mom a voicemail

## 2020-07-31 ENCOUNTER — Other Ambulatory Visit: Payer: Self-pay

## 2020-07-31 ENCOUNTER — Encounter: Payer: Self-pay | Admitting: Podiatry

## 2020-07-31 ENCOUNTER — Ambulatory Visit (INDEPENDENT_AMBULATORY_CARE_PROVIDER_SITE_OTHER): Payer: Medicare Other | Admitting: Podiatry

## 2020-07-31 DIAGNOSIS — B351 Tinea unguium: Secondary | ICD-10-CM | POA: Diagnosis not present

## 2020-07-31 DIAGNOSIS — M79675 Pain in left toe(s): Secondary | ICD-10-CM

## 2020-07-31 DIAGNOSIS — M79674 Pain in right toe(s): Secondary | ICD-10-CM

## 2020-07-31 NOTE — Progress Notes (Signed)
Complaint:  Visit Type: Patient returns to my office for continued preventative foot care services. Complaint: Patient states" my nails have grown long and thick and become painful to walk and wear shoes"  The patient presents for preventative foot care services. No changes to ROS. Patient has Downs syndrome and is accompanied by his mother.  Podiatric Exam: Vascular: dorsalis pedis and posterior tibial pulses are palpable bilateral. Capillary return is immediate. Temperature gradient is WNL. Skin turgor WNL  Sensorium: Normal Semmes Weinstein monofilament test. Normal tactile sensation bilaterally. Nail Exam: Pt has thick disfigured discolored nails with subungual debris noted bilateral entire nail hallux through fifth toenails Ulcer Exam: There is no evidence of ulcer or pre-ulcerative changes or infection. Orthopedic Exam: Muscle tone and strength are WNL. No limitations in general ROM. No crepitus or effusions noted. Foot type and digits show no abnormalities. HAV  B/L. Skin: No Porokeratosis. No infection or ulcers  Diagnosis:  Onychomycosis, , Pain in right toe, pain in left toes  Treatment & Plan Procedures and Treatment: Consent by patient was obtained for treatment procedures.   Debridement of mycotic and hypertrophic toenails, 1 through 5 bilateral and clearing of subungual debris. No ulceration, no infection noted.  Return Visit-Office Procedure: Patient instructed to return to the office for a follow up visit 8 weeks  for continued evaluation and treatment.    Helane Gunther DPM

## 2020-08-10 DIAGNOSIS — K220 Achalasia of cardia: Secondary | ICD-10-CM | POA: Diagnosis not present

## 2020-08-10 DIAGNOSIS — K219 Gastro-esophageal reflux disease without esophagitis: Secondary | ICD-10-CM | POA: Diagnosis not present

## 2020-08-15 ENCOUNTER — Telehealth: Payer: Self-pay | Admitting: Family Medicine

## 2020-08-15 NOTE — Telephone Encounter (Signed)
I really don't have a preference.  Most recent data shows Moderna getting a better antibody response, so can set up to get those here (and his flu shot, if he hasn't gotten yet).

## 2020-08-15 NOTE — Telephone Encounter (Signed)
Pt's mother called and would to set up covid vaccines for pt. She would likt to know your recommendations? Pfizer or [employer]. Please advise at [phone].

## 2020-08-17 ENCOUNTER — Ambulatory Visit: Payer: Medicare Other

## 2020-09-08 ENCOUNTER — Encounter (HOSPITAL_COMMUNITY): Payer: Self-pay | Admitting: Emergency Medicine

## 2020-09-08 ENCOUNTER — Emergency Department (HOSPITAL_COMMUNITY)
Admission: EM | Admit: 2020-09-08 | Discharge: 2020-09-08 | Disposition: A | Discharge status: Home / Self Care | Payer: Medicare Other | Attending: Emergency Medicine | Admitting: Emergency Medicine

## 2020-09-08 ENCOUNTER — Other Ambulatory Visit: Payer: Self-pay

## 2020-09-08 DIAGNOSIS — Z5321 Procedure and treatment not carried out due to patient leaving prior to being seen by health care provider: Secondary | ICD-10-CM | POA: Insufficient documentation

## 2020-09-08 DIAGNOSIS — K047 Periapical abscess without sinus: Secondary | ICD-10-CM | POA: Insufficient documentation

## 2020-09-08 NOTE — ED Triage Notes (Signed)
Patient's mother reports right lower dental abscess with swelling today , denies fever or chills .

## 2020-09-08 NOTE — ED Notes (Signed)
Patients mother states she will just take him to urgent care in the morning d/t wait time.

## 2020-09-09 ENCOUNTER — Encounter (HOSPITAL_COMMUNITY): Payer: Self-pay | Admitting: Emergency Medicine

## 2020-09-09 ENCOUNTER — Other Ambulatory Visit: Payer: Self-pay

## 2020-09-09 ENCOUNTER — Ambulatory Visit (HOSPITAL_COMMUNITY)
Admission: EM | Admit: 2020-09-09 | Discharge: 2020-09-09 | Disposition: A | Discharge status: Home / Self Care | Payer: Medicare Other | Attending: Physician Assistant | Admitting: Physician Assistant

## 2020-09-09 DIAGNOSIS — K122 Cellulitis and abscess of mouth: Secondary | ICD-10-CM

## 2020-09-09 DIAGNOSIS — K047 Periapical abscess without sinus: Secondary | ICD-10-CM

## 2020-09-09 MED ORDER — AMOXICILLIN-POT CLAVULANATE 875-125 MG PO TABS: 1.0000 | ORAL_TABLET | Freq: Two times a day (BID) | ORAL | 0 refills | Status: DC

## 2020-09-09 NOTE — ED Provider Notes (Addendum)
[facility]    CSN: [id number] Arrival date & time: 09/09/20  1133      History   Chief Complaint Chief Complaint  Patient presents with  . Facial Swelling    HPI Ronald Le is a 29 y.o. male.   Patient with PMH down syndrome.  Mom historian, concerned about dental abscess and facial swelling.  She reports he has appointment with dentist next week for tooth extraction.  She gave ibuprofen w/o relief of pain.       Past Medical History:  Diagnosis Date  . Allergy   . Candida esophagitis (HCC) 04/2012   after ABX for pneumonia  . Chronic serous otitis media   . Communication problem    "when he feels bad he just communicates w/thumbs up or down or shakes his head; very limited communication normally" (09/30/2012)  . Complication of anesthesia 08/2012   "hard to wake up after balloon dilatation)  . Complication of anesthesia 1999   "respiratory arrest after hip OR; had to get Narcan" (09/30/2012)  . Down's syndrome   . Dysphagia 09/30/2012   now resolved  . Finger fracture 2009   h/o  . GERD (gastroesophageal reflux disease)    "maybe" (09/30/2012)  . Heart murmur    "@ birth; resolved" (09/30/2012)  . Hypothyroidism   . Legg-Calve-Perthes disease   . OSA on CPAP    noncompliant with use of CPAP  . Peristalsis    "almost absent" (09/30/2012)  . Pneumonia    multiple hospitalizatons as toddler, but 10 day stay age 6yo  . Viral syndrome 09/1995   hospital viral syndrome.    Patient Active Problem List   Diagnosis Date Noted  . Vitamin D deficiency 10/04/2015  . OSA (obstructive sleep apnea) 10/03/2015  . Hearing loss 10/03/2015  . Unspecified constipation 10/07/2013  . Achalasia 11/08/2012  . Esophageal diverticulum 05/12/2012  . Onychomycosis 05/12/2012  . Candida esophagitis (HCC) 05/03/2012  . PNA (pneumonia) 05/01/2012  . Down's syndrome 04/29/2012  . Hypothyroidism 04/29/2012  . 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 ADENOIDECTOMY AND MYRINGOTOMY WITH TUBE PLACEMENT  11/1996   bilaterally  . BALLOON DILATION  09/01/2012   Procedure: BALLOON DILATION;  Surgeon: Willis Modena, MD;  Location: [facility];  Service: Endoscopy;  Laterality: N/A;  . ESOPHAGOGASTRODUODENOSCOPY  10/01/2012   Procedure: ESOPHAGOGASTRODUODENOSCOPY (EGD);  Surgeon: Vertell Novak., MD;  Location: [facility];  Service: Endoscopy;  Laterality: N/A;  . ESOPHAGOGASTRODUODENOSCOPY  10/01/12  . ESOPHAGOGASTRODUODENOSCOPY (EGD) WITH PROPOFOL N/A 12/21/2013   Procedure: ESOPHAGOGASTRODUODENOSCOPY (EGD) WITH PROPOFOL;  Surgeon: Willis Modena, MD;  Location: [facility];  Service: Endoscopy;  Laterality: N/A;  . GUM SURGERY  ~ 2004   graft to gums from roof of mouth.  Marland Kitchen HIP ARTHROPLASTY  10/1998   left; due to Legg-Calve-Perthe dz and subsequent surgery to remove hardware  . LAPAROSCOPIC HELLER MYOTOMY  11/11/12   Dr.Farrell  . laparoscopic surgery  11-16-13   . TYMPANOSTOMY TUBE PLACEMENT  11/1996   bilaterally       Home Medications    Prior to Admission medications   Medication Sig Start Date End Date Taking? Authorizing Provider  levothyroxine (SYNTHROID) 100 MCG tablet Take #1 tablet Monday-Friday. 12/15/19  Yes [provider]  omeprazole (PRILOSEC) 20 MG capsule Take by mouth daily. 01/12/19  Yes [provider]  amoxicillin-clavulanate (AUGMENTIN) 875-125 MG tablet Take 1 tablet by  mouth every 12 (twelve) hours. 09/09/20   Evern Core, PA-C  ergocalciferol (VITAMIN D2) 1.25 MG (50000 UT) capsule Take 1 capsule (50,000 Units total) by mouth once a week. 12/01/19   Joselyn Arrow, MD  VITAMIN D, CHOLECALCIFEROL, PO Take by mouth.    [provider]    Family History Family History  Problem Relation Age of Onset  . Irritable bowel syndrome Mother   . Interstitial cystitis Mother   . Fibromyalgia Mother   . Gallbladder disease Father         gallbladder surgery  . Migraines Sister   . Depression Sister        postpartum  . Alzheimer's disease Maternal Grandmother        dementia.  . Pyloric stenosis Brother        as baby  . Hearing loss Maternal Grandfather        cochlear loss    Social History Social History   Tobacco Use  . Smoking status: Never Smoker  . Smokeless tobacco: Never Used  Vaping Use  . Vaping Use: Never used  Substance Use Topics  . Alcohol use: Yes    Comment: 1 glass of white wine at parties per pt 11/2018  . Drug use: No     Allergies   Patient has no known allergies.   Review of Systems Review of Systems  Constitutional: Negative for chills, fatigue and fever.  HENT: Positive for facial swelling. Negative for drooling.   Gastrointestinal: Negative for nausea and vomiting.  Skin: Negative for wound.  Hematological: Negative for adenopathy. Does not bruise/bleed easily.  Psychiatric/Behavioral: Positive for sleep disturbance.     Physical Exam Triage Vital Signs ED Triage Vitals [09/09/20 1319]  Enc Vitals Group     BP 109/72     Pulse Rate 64     Resp 19     Temp 98.2 F (36.8 C)     Temp Source Oral     SpO2 99 %     Weight      Height      Head Circumference      Peak Flow      Pain Score      Pain Loc      Pain Edu?      Excl. in GC?    No data found.  Updated Vital Signs BP 109/72 (BP Location: Right Arm)   Pulse 64   Temp 98.2 F (36.8 C) (Oral)   Resp 19   SpO2 99%   Visual Acuity Right Eye Distance:   Left Eye Distance:   Bilateral Distance:    Right Eye Near:   Left Eye Near:    Bilateral Near:     Physical Exam Vitals and nursing note reviewed.  Constitutional:      Appearance: He is well-developed.  HENT:     Head: Normocephalic and atraumatic.     Jaw: Tenderness and swelling (R mandiblar swelling noted) present. No trismus, pain on movement or malocclusion.     Nose: No congestion or rhinorrhea.     Mouth/Throat:     Mouth:  Mucous membranes are moist.     Dentition: Abnormal dentition. Dental tenderness, gingival swelling, dental caries and dental abscesses present.     Pharynx: Oropharynx is clear. Uvula midline. No uvula swelling.     Tonsils: No tonsillar exudate or tonsillar abscesses. 0 on the right. 0 on the left.  Eyes:     General: No scleral icterus.  Extraocular Movements: Extraocular movements intact.     Conjunctiva/sclera: Conjunctivae normal.  Pulmonary:     Effort: Pulmonary effort is normal. No respiratory distress.  Musculoskeletal:     Cervical back: Neck supple.  Lymphadenopathy:     Cervical: No cervical adenopathy.  Skin:    General: Skin is warm and dry.     Capillary Refill: Capillary refill takes less than 2 seconds.  Neurological:     General: No focal deficit present.     Mental Status: He is alert.     Motor: No weakness.     Gait: Gait normal.  Psychiatric:        Mood and Affect: Mood normal.        Behavior: Behavior normal.      UC Treatments / Results  Labs (all labs ordered are listed, but only abnormal results are displayed) Labs Reviewed - No data to display  EKG   Radiology No results found.  Procedures Procedures (including critical care time)  Medications Ordered in UC Medications - No data to display  Initial Impression / Assessment and Plan / UC Course  I have reviewed the triage vital signs and the nursing notes.  Pertinent labs & imaging results that were available during my care of the patient were reviewed by me and considered in my medical decision making (see chart for details).     Follow up with dentist Go to ED with worsening symptoms Take medication as prescribed.  Final Clinical Impressions(s) / UC Diagnoses   Final diagnoses:  Dental abscess  Cellulitis and abscess of oral soft tissues     Discharge Instructions     Follow up with dentist as soon as possible Go to ED with worsening symptoms.     ED  Prescriptions    Medication Sig Dispense Auth. Provider   amoxicillin-clavulanate (AUGMENTIN) 875-125 MG tablet Take 1 tablet by mouth every 12 (twelve) hours. 14 tablet Evern Core, PA-C     PDMP not reviewed this encounter.   Evern Core, PA-C 09/09/20 1404    Evern Core, PA-C 09/09/20 1406

## 2020-09-09 NOTE — Discharge Instructions (Signed)
Follow up with dentist as soon as possible Go to ED with worsening symptoms.

## 2020-09-09 NOTE — ED Triage Notes (Signed)
Pt presents with facial swelling that started yesterday. Mother states possible abscess.

## 2020-09-26 ENCOUNTER — Ambulatory Visit: Payer: Medicare Other | Admitting: Podiatry

## 2020-10-11 ENCOUNTER — Telehealth: Payer: Self-pay | Admitting: Family Medicine

## 2020-10-11 NOTE — Telephone Encounter (Signed)
Pt mom called and states that she had tested for COVID the week of the 20th of October and she is pretty sure that Mouhamad did to even though she did not get him tested, states he had coughing, body aches, and fever and she just treated, she wants to know if pt can come now for the 1st covid and flu shot   She can be reached at [phone]

## 2020-10-11 NOTE — Telephone Encounter (Signed)
Can you clarify this message--did mother test POSITIVE?  If he is asymptomatic now, he is fine to get a flu shot. If he thinks he had COVID within the month (had symptoms and he and/or mother tested positive), then it is a little soon for vaccine.  Generally after having the illness, you can wait 3 months, though you do not HAVE to--there just may be more side effects to the vaccine. So, clarify and send back if there are further concerns/questions.

## 2020-10-11 NOTE — Telephone Encounter (Signed)
Sorry I meant pt mom did test positive for Covid. She figured  since she had it Garrit had to be positive to. She states pt is better now, I will call pt to get setup for flu shot  and give information about covid shot

## 2020-10-15 ENCOUNTER — Other Ambulatory Visit (INDEPENDENT_AMBULATORY_CARE_PROVIDER_SITE_OTHER): Payer: Medicare Other

## 2020-10-15 ENCOUNTER — Other Ambulatory Visit: Payer: Self-pay

## 2020-10-15 DIAGNOSIS — Z23 Encounter for immunization: Secondary | ICD-10-CM

## 2020-11-02 ENCOUNTER — Encounter: Payer: Self-pay | Admitting: Podiatry

## 2020-11-02 ENCOUNTER — Ambulatory Visit (INDEPENDENT_AMBULATORY_CARE_PROVIDER_SITE_OTHER): Payer: Medicare Other | Admitting: Podiatry

## 2020-11-02 ENCOUNTER — Other Ambulatory Visit: Payer: Self-pay

## 2020-11-02 DIAGNOSIS — M79675 Pain in left toe(s): Secondary | ICD-10-CM | POA: Diagnosis not present

## 2020-11-02 DIAGNOSIS — B351 Tinea unguium: Secondary | ICD-10-CM

## 2020-11-02 DIAGNOSIS — M79674 Pain in right toe(s): Secondary | ICD-10-CM

## 2020-11-02 NOTE — Progress Notes (Signed)
Complaint:  Visit Type: Patient returns to my office for continued preventative foot care services. Complaint: Patient states" my nails have grown long and thick and become painful to walk and wear shoes"  The patient presents for preventative foot care services. No changes to ROS. Patient has Downs syndrome and is accompanied by his mother.  Podiatric Exam: Vascular: dorsalis pedis and posterior tibial pulses are palpable bilateral. Capillary return is immediate. Temperature gradient is WNL. Skin turgor WNL  Sensorium: Normal Semmes Weinstein monofilament test. Normal tactile sensation bilaterally. Nail Exam: Pt has thick disfigured discolored nails with subungual debris noted bilateral entire nail hallux through fifth toenails Ulcer Exam: There is no evidence of ulcer or pre-ulcerative changes or infection. Orthopedic Exam: Muscle tone and strength are WNL. No limitations in general ROM. No crepitus or effusions noted. Foot type and digits show no abnormalities. HAV  B/L. Skin: No Porokeratosis. No infection or ulcers  Diagnosis:  Onychomycosis, , Pain in right toe, pain in left toes  Treatment & Plan Procedures and Treatment: Consent by patient was obtained for treatment procedures.   Debridement of mycotic and hypertrophic toenails, 1 through 5 bilateral and clearing of subungual debris. No ulceration, no infection noted.  Return Visit-Office Procedure: Patient instructed to return to the office for a follow up visit 8 weeks  for continued evaluation and treatment.    Helane Gunther DPM

## 2020-12-02 NOTE — Patient Instructions (Incomplete)
HEALTH MAINTENANCE RECOMMENDATIONS:  It is recommended that you get at least 30 minutes of aerobic exercise at least 5 days/week (for weight loss, you may need as much as 60-90 minutes). This can be any activity that gets your heart rate up. This can be divided in 10-15 minute intervals if needed, but try and build up your endurance at least once a week.  Weight bearing exercise is also recommended twice weekly.  Eat a healthy diet with lots of vegetables, fruits and fiber.  "Colorful" foods have a lot of vitamins (ie green vegetables, tomatoes, red peppers, etc).  Limit sweet tea, regular sodas and alcoholic beverages, all of which has a lot of calories and sugar.  Up to 2 alcoholic drinks daily may be beneficial for men (unless trying to lose weight, watch sugars).  Drink a lot of water.  Sunscreen of at least SPF 30 should be used on all sun-exposed parts of the skin when outside between the hours of 10 am and 4 pm (not just when at beach or pool, but even with exercise, golf, tennis, and yard work!)  Use a sunscreen that says "broad spectrum" so it covers both UVA and UVB rays, and make sure to reapply every 1-2 hours.  Remember to change the batteries in your smoke detectors when changing your clock times in the spring and fall.  Carbon monoxide detectors are recommended for your home.  Use your seat belt every time you are in a car, and please drive safely and not be distracted with cell phones and texting while driving.   Ronald Le , Thank you for taking time to come for your Medicare Wellness Visit. I appreciate your ongoing commitment to your health goals. Please review the following plan we discussed and let me know if I can assist you in the future.    This is a list of the screening recommended for you and due dates:  Health Maintenance  Topic Date Due  .  Hepatitis C: One time screening is recommended by Center for Disease Control  (CDC) for  adults born from [birth year] through  1965.   Never done  . HIV Screening  Never done  . Tetanus Vaccine  03/18/2021  . Flu Shot  Completed    Continue yearly flu shots. COVID vaccine was started today--return for nurse visit for 2nd dose in 3 weeks for 2nd dose of Pfizer.  [name] will be due for a tetanus booster in April.  This is covered by Medicare Part D, not covered for [place] to give in the office.  He needs to get from the pharmacy ([facility]).  This isn't due until April.  It needs to be given at least 2 weeks after any other vaccine (so shouldn't have any problems in April, as he will have completed the COVID series).  He needs to be taking vitamin D every day. We are checking his labs today--he may end up needing prescription.  But even if he does need prescription, once he finishes it, he needs to start taking 1000 IU of Vitamin D3 every day.  We are going to refer for a home sleep test, to further evaluate his sleep apnea, and get him back on treatment.  Please schedule a routine eye exam.  He will need to find a new provider (you can check with insurance).  Go to [facility] to get x-rays of his Left hip. You can [address] either to [address]  You do not need appointment.
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
## 2020-12-03 ENCOUNTER — Ambulatory Visit (INDEPENDENT_AMBULATORY_CARE_PROVIDER_SITE_OTHER): Payer: Medicare Other | Admitting: Family Medicine

## 2020-12-03 ENCOUNTER — Encounter: Payer: Self-pay | Admitting: Family Medicine

## 2020-12-03 ENCOUNTER — Other Ambulatory Visit: Payer: Self-pay

## 2020-12-03 VITALS — BP 122/84 | HR 80 | Ht <= 58 in | Wt 112.4 lb

## 2020-12-03 DIAGNOSIS — Z23 Encounter for immunization: Secondary | ICD-10-CM

## 2020-12-03 DIAGNOSIS — E559 Vitamin D deficiency, unspecified: Secondary | ICD-10-CM

## 2020-12-03 DIAGNOSIS — Z5181 Encounter for therapeutic drug level monitoring: Secondary | ICD-10-CM | POA: Diagnosis not present

## 2020-12-03 DIAGNOSIS — H9190 Unspecified hearing loss, unspecified ear: Secondary | ICD-10-CM

## 2020-12-03 DIAGNOSIS — G4733 Obstructive sleep apnea (adult) (pediatric): Secondary | ICD-10-CM

## 2020-12-03 DIAGNOSIS — Z0000 Encounter for general adult medical examination without abnormal findings: Secondary | ICD-10-CM | POA: Diagnosis not present

## 2020-12-03 DIAGNOSIS — Q909 Down syndrome, unspecified: Secondary | ICD-10-CM | POA: Diagnosis not present

## 2020-12-03 DIAGNOSIS — M25552 Pain in left hip: Secondary | ICD-10-CM

## 2020-12-03 DIAGNOSIS — E038 Other specified hypothyroidism: Secondary | ICD-10-CM

## 2020-12-03 DIAGNOSIS — K5900 Constipation, unspecified: Secondary | ICD-10-CM

## 2020-12-03 LAB — COMPREHENSIVE METABOLIC PANEL

## 2020-12-03 LAB — CBC WITH DIFFERENTIAL/PLATELET
Basophils Absolute: 0.1 10*3/uL (ref 0.0–0.2)
Immature Grans (Abs): 0 10*3/uL (ref 0.0–0.1)
Immature Granulocytes: 0 %
Lymphs: 30 %
MCV: 92 fL (ref 79–97)
Monocytes Absolute: 0.5 10*3/uL (ref 0.1–0.9)
Monocytes: 10 %
Neutrophils Absolute: 3 10*3/uL (ref 1.4–7.0)
RDW: 15.3 % (ref 11.6–15.4)

## 2020-12-03 NOTE — Progress Notes (Signed)
Chief Complaint  Patient presents with  . Medicare Wellness    Nonfasting AWV/med check plus. He says he has no concerns. When I asked him if everything was good he said "yep."    Ronald Le is a 30 y.o. male who presents for a complete physical and f/u on chronic problems.  Patient has Down's Syndrome, limited language, and was dropped off by his mother, who left to take his younger sister to the doctor.  I was able to contact her by phone after visit with patient to fill in some of the gaps.  He has the following concerns: He continues to complain of L hip pain, unchanged per pt. No falls or injuries. They never went for the x-ray previously ordered (last year).  2 years ago he was referred to ortho, but didn't go to the visit.  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 He reports that he walks a lot, and runs some, denies limitation of activity from his hip pain.  The mother reports that he is minimally active--taking the dog out back, and not as active as he stated.  Likely had COVID in October.  He had been ill at the same time his mother was sick, and she tested + for COVID.  He had not yet been vaccinated.  They are interested in getting vaccination (mother had questions, was hesitant given his recent illness--discussed at length on phone, and agreed to giving today).  He sees the podiatrist regularly for nail debridement (last in 10/2020). He has been to the dentist, a filling is being planned soon. (was delayed due to [place]).  Vitamin D deficiency: Last level was 21 in 11/2019, and had been 25.2 in 11/2018.  Last year they reported they have the gummy vitamin D, and admits to remembering it maybe once a week, not regularly (not taking along with omeprazole and thyroid med due to it being a gummy, not a tablet). He was treated with another course of prescription replacement. He is not currently taking any vitamin D  Hypothyroidism:  Last saw endo at [facility]  in 11/2019. He continues on Synthroid 184mg taken Monday through Friday. Last TSH was checked here 11/2019, though endo recommended rechecking at 6 mos and a year (not done there). They report compliance in taking medication as directed.  Mother reports he is losing some hair. Not complaining about bowels (prior h/o constipation). She believes he has an upcoming endo appt, within 1-2 months. Lab Results  Component Value Date   TSH 2.400 11/30/2019   H/o depression--He denies any depression, reports moods are good. Mother reports that he likes to be by himself a lot, sleeps a lot (no changes).  He is not currently in therapy; mom doesn't have significant concerns about moods (as she had in the past).  He reports his diet is good--his favorite food is salads.  He likes eating salads, fruit salad, burgers. Mother reports that he is still getting up in the middle of the night and eating, but does report he eats more salads. He has lost some weight.  OSA--he is not using CPAP. Previously reported with at-home sleep test that it "didn't do right".  Needed to be repeated. Doesn't look like another referral was placed. She is interested in doing this.  He is under the care of Dr. OPaulita Fujita has h/o achalasia, reflux, and is on omeprazole. Last seen 07/2020.  He currently denies any chest pain, dysphagia.     Immunization History  Administered Date(s) Administered  . DTaP 01/31/1992, 04/10/1992, 06/19/1992, 04/16/1993, 09/27/1996  . Hepatitis A 02/05/2009, 03/19/2011  . Hepatitis B 04/10/1992, 06/19/1992, 04/16/1993  . HiB (PRP-OMP) 01/31/1992, 04/10/1992, 06/19/1992, 04/16/1993  . IPV 01/31/1992, 04/10/1992, 04/16/1993, 09/27/1996  . Influenza Split 11/12/2006, 11/26/2007, 09/28/2008, 09/02/2012  . Influenza,inj,Quad PF,6+ Mos 09/08/2013, 08/31/2014, 10/03/2015, 10/08/2017, 11/29/2018, 11/30/2019, 10/15/2020  . MMR 10/01/1993, 09/27/1996  . Meningococcal Conjugate 02/05/2009  . Pneumococcal  Polysaccharide-23 09/15/2013  . Td 11/12/2006  . Tdap 03/19/2011  . Varicella 09/25/1998, 11/26/2007   Dentist:  UTD Ophtho:past due Hearing screen: He previously had a hearing aid for his Ronald Le, doesn't use it (and lost it when they moved, mom thinks he threw them away).  He denies hearing concerns. Previously went to [facility], and also sawDr. Cresenciano Lick Exercise: walks the dog. Mom reports this is minimal Walks every day ("two hours" per pt, mother denies)  Lipids: Lab Results  Component Value Date   CHOL 120 11/30/2018   HDL 39 (L) 11/30/2018   LDLCALC 61 11/30/2018   TRIG 101 11/30/2018   CHOLHDL 3.1 11/30/2018   Other doctors caring for this patient: Podiatrist: Dr. Prudence Davidson Endocrinologist: Dr. Francetta Found Dentist: Dr. Imagene Riches Ophtho: Dr. Annamaria Boots (discharged due to missed visits) GI: Dr. Paulita Fujita ENT: Dr. Cresenciano Lick (retired) Audiologist: [facility] (had hearing aid for left year, lost it when they moved, wasn't using it)  Falls: none Depression: negative screen  PMH, PSH, SH reviewed No changes to Ronald Le per mother, who filled out paperwork.  Outpatient Encounter Medications as of 12/03/2020  Medication Sig Note  . levothyroxine (SYNTHROID) 100 MCG tablet Take 100 mcg by mouth daily. 12/03/2020: Mon-Fri  . omeprazole (PRILOSEC) 20 MG capsule 1 capsule   . [DISCONTINUED] levothyroxine (SYNTHROID) 88 MCG tablet 1 tablet 1/2 tablet on sun.   . bismuth subsalicylate (PEPTO-BISMOL) 262 MG/15ML suspension 30 ml as needed (Patient not taking: Reported on 12/03/2020)   . Fexofenadine HCl (ALLEGRA PO) 1 tablet (Patient not taking: Reported on 12/03/2020)   . VITAMIN D, CHOLECALCIFEROL, PO Take by mouth. (Patient not taking: Reported on 12/03/2020) 11/30/2019: "when we remember", okay for the last week, not too often prior  . [DISCONTINUED] amoxicillin-clavulanate (AUGMENTIN) 875-125 MG tablet Take 1 tablet by mouth every 12 (twelve) hours.   . [DISCONTINUED] ergocalciferol  (VITAMIN D2) 1.25 MG (50000 UT) capsule Take 1 capsule (50,000 Units total) by mouth once a week.    No facility-administered encounter medications on file as of 12/03/2020.   No Known Allergies  ROS: no fever, chills, headaches, change in appetite. Deniesexertional chest pain, shortness of breath. Denies URI symptoms, cough. Denies nausea, vomiting, heartburn, bowel changes. Denies urinary complaints.  +left hip pain. No bleeding, bruising, rash. Moods are good, per patient.  see HPI.   PHYSICAL EXAM:  BP 122/84   Pulse 80   Ht 4' 10"  (1.473 m)   Wt 112 lb 6.4 oz (51 kg)   BMI 23.49 kg/m   Wt Readings from Last 3 Encounters:  12/03/20 112 lb 6.4 oz (51 kg)  09/08/20 121 lb 4.1 oz (55 kg)  11/30/19 116 lb 6.4 oz (52.8 kg)    Pleasant male, in good spirits, in no distress. He is alert.  He is less vocal than at last visit--using thumbs up and thumbs down, yes and now. He is speaking quietly, rushed, slight stutter, difficult to understand. HEENT: PERRL, EOMI, conjunctiva clear. Fundi not well visualized. R TM is partly obscured by cerumen, completely obscured on the left. Wearing  mask due to COVID-19 pandemic, though was under his chin for part of the visit.  OP is normal. Neck:/spine:spine is nontender. No CVA tenderness or SI tenderness. No lymphadenopathy, thyromegaly or mass. Heart: regular rate and rhythm without murmur Chest wall nontender to palpation Lungs: clear bilaterally Abdomen: normal bowel sounds, soft, nontender, no organomegaly or mass. GU: normal genitalia without lesions or hernias. Testicles are descended bilaterally, no masses Skin:normal turgor, no rashes or bruising. Psych: normal mood, affect, hygiene and grooming. Normal eye contact. Speech is difficult to understand. Neuro: alert, normal strength, sensation, gait Extremities: normal pulses, no edema. Thickened, long, onychomycotic nails on all toes. Some callouses on the distal  toes.   ASSESSMENT/PLAN:  Annual physical exam  Vitamin D deficiency - noncompliant with supplements, likely will be low again.  Advised mother of the importance of a daily supplement. - Plan: VITAMIN D 25 Hydroxy (Vit-D Deficiency, Fractures)  OSA (obstructive sleep apnea) - not being treated.  Check home sleep study - Plan: Home sleep test  Down's syndrome  Other specified hypothyroidism - Some hair loss, weight loss. Recheck TSH.  Endo should be able to see at his visit through Care Everywhere - Plan: TSH  Left hip pain - Plan: DG HIP UNILAT W OR W/O PELVIS 2-3 VIEWS LEFT  Hearing loss, unspecified hearing loss type, unspecified laterality - no longer has hearing aids. Component of cerumen impaction. Pt denies being bothered by hearing issues, nor was mother too concerned  Medication monitoring encounter - Plan: TSH, VITAMIN D 25 Hydroxy (Vit-D Deficiency, Fractures), CBC with Differential/Platelet, Comprehensive metabolic panel  Need for [id number] vaccine - mother and pt agreeable to vaccine; illness was approx 3 mos ago.  Tetanus will be due in April--needs to get from pharmacy (Medicare)  CBC, c-met, Vit D, TSH  Patient was counseled re: healthy diet, exercise recommendations, safe sex. Encouraged taking his medications daily, including vitamin D.

## 2020-12-04 LAB — COMPREHENSIVE METABOLIC PANEL
AST: 19 IU/L (ref 0–40)
Albumin/Globulin Ratio: 1.3 (ref 1.2–2.2)
Albumin: 4.3 g/dL (ref 4.1–5.2)
Alkaline Phosphatase: 75 IU/L (ref 44–121)
BUN/Creatinine Ratio: 10 (ref 9–20)
Bilirubin Total: 0.7 mg/dL (ref 0.0–1.2)
CO2: 26 mmol/L (ref 20–29)
Calcium: 9.5 mg/dL (ref 8.7–10.2)
Chloride: 102 mmol/L (ref 96–106)
Creatinine, Ser: 1.3 mg/dL — ABNORMAL HIGH (ref 0.76–1.27)
GFR calc non Af Amer: 74 mL/min/{1.73_m2} (ref >59)
Globulin, Total: 3.3 g/dL (ref 1.5–4.5)
Potassium: 4 mmol/L (ref 3.5–5.2)
Sodium: 140 mmol/L (ref 134–144)

## 2020-12-04 LAB — CBC WITH DIFFERENTIAL/PLATELET
Basos: 2 %
EOS (ABSOLUTE): 0.1 10*3/uL (ref 0.0–0.4)
Eos: 1 %
Hematocrit: 45 % (ref 37.5–51.0)
Hemoglobin: 15.9 g/dL (ref 13.0–17.7)
Lymphocytes Absolute: 1.6 10*3/uL (ref 0.7–3.1)
MCH: 32.4 pg (ref 26.6–33.0)
MCHC: 35.3 g/dL (ref 31.5–35.7)
Neutrophils: 57 %
Platelets: 214 10*3/uL (ref 150–450)
RBC: 4.91 x10E6/uL (ref 4.14–5.80)
WBC: 5.2 10*3/uL (ref 3.4–10.8)

## 2020-12-04 LAB — VITAMIN D 25 HYDROXY (VIT D DEFICIENCY, FRACTURES): Vit D, 25-Hydroxy: 20.3 ng/mL — ABNORMAL LOW (ref 30.0–100.0)

## 2020-12-04 LAB — TSH: TSH: 32 u[IU]/mL — ABNORMAL HIGH (ref 0.450–4.500)

## 2020-12-05 NOTE — Addendum Note (Signed)
Addended by: Debbrah Alar F on: 12/05/2020 08:14 AM   Modules accepted: Orders

## 2020-12-06 ENCOUNTER — Other Ambulatory Visit: Payer: Self-pay | Admitting: *Deleted

## 2020-12-06 DIAGNOSIS — G4733 Obstructive sleep apnea (adult) (pediatric): Secondary | ICD-10-CM

## 2020-12-10 ENCOUNTER — Other Ambulatory Visit: Payer: Self-pay | Admitting: *Deleted

## 2020-12-10 MED ORDER — ERGOCALCIFEROL 1.25 MG (50000 UT) PO CAPS: 50000.0000 [IU] | ORAL_CAPSULE | ORAL | 0 refills | Status: DC

## 2020-12-24 ENCOUNTER — Ambulatory Visit: Payer: Medicare Other

## 2021-01-04 ENCOUNTER — Ambulatory Visit: Payer: Medicare Other | Admitting: Podiatry

## 2021-01-16 ENCOUNTER — Ambulatory Visit: Payer: Medicare Other

## 2021-01-18 ENCOUNTER — Ambulatory Visit (HOSPITAL_BASED_OUTPATIENT_CLINIC_OR_DEPARTMENT_OTHER): Payer: Medicare Other | Attending: Family Medicine | Admitting: Internal Medicine

## 2021-02-26 ENCOUNTER — Ambulatory Visit: Payer: Medicare Other | Admitting: Podiatry

## 2021-07-10 DIAGNOSIS — E039 Hypothyroidism, unspecified: Secondary | ICD-10-CM | POA: Diagnosis not present

## 2021-07-10 DIAGNOSIS — E559 Vitamin D deficiency, unspecified: Secondary | ICD-10-CM | POA: Diagnosis not present

## 2021-07-17 ENCOUNTER — Encounter: Payer: Self-pay | Admitting: Family Medicine

## 2021-10-02 ENCOUNTER — Ambulatory Visit: Payer: Medicare Other | Admitting: Podiatry

## 2021-10-28 ENCOUNTER — Other Ambulatory Visit: Payer: Self-pay

## 2021-10-28 ENCOUNTER — Ambulatory Visit (INDEPENDENT_AMBULATORY_CARE_PROVIDER_SITE_OTHER): Payer: Medicare Other | Admitting: Podiatry

## 2021-10-28 ENCOUNTER — Encounter: Payer: Self-pay | Admitting: Podiatry

## 2021-10-28 DIAGNOSIS — M79675 Pain in left toe(s): Secondary | ICD-10-CM | POA: Diagnosis not present

## 2021-10-28 DIAGNOSIS — B351 Tinea unguium: Secondary | ICD-10-CM

## 2021-10-28 DIAGNOSIS — M79674 Pain in right toe(s): Secondary | ICD-10-CM | POA: Diagnosis not present

## 2021-10-28 NOTE — Progress Notes (Signed)
Complaint:  Visit Type: Patient returns to my office for continued preventative foot care services. Complaint: Patient states" my nails have grown long and thick and become painful to walk and wear shoes"  The patient presents for preventative foot care services. No changes to ROS. Patient has Downs syndrome and is accompanied by his mother.  Podiatric Exam: Vascular: dorsalis pedis and posterior tibial pulses are palpable bilateral. Capillary return is immediate. Temperature gradient is WNL. Skin turgor WNL  Sensorium: Normal Semmes Weinstein monofilament test. Normal tactile sensation bilaterally. Nail Exam: Pt has thick disfigured discolored nails with subungual debris noted bilateral entire nail hallux through fifth toenails Ulcer Exam: There is no evidence of ulcer or pre-ulcerative changes or infection. Orthopedic Exam: Muscle tone and strength are WNL. No limitations in general ROM. No crepitus or effusions noted. Foot type and digits show no abnormalities. HAV  B/L. Skin: No Porokeratosis. No infection or ulcers  Diagnosis:  Onychomycosis, , Pain in right toe, pain in left toes  Treatment & Plan Procedures and Treatment: Consent by patient was obtained for treatment procedures.   Debridement of mycotic and hypertrophic toenails, 1 through 5 bilateral and clearing of subungual debris. No ulceration, no infection noted.  Return Visit-Office Procedure: Patient instructed to return to the office for a follow up visit 8 weeks  for continued evaluation and treatment.    Helane Gunther DPM

## 2021-12-03 NOTE — Progress Notes (Deleted)
°No chief complaint on file. ° °  °Ronald Le is a 31 y.o. male who presents for a complete physical and f/u on chronic problems.   °Patient has Down's Syndrome, limited language °  °Last year he reported ongoing L hip pain (unchanged/chronic).  X-ray was ordered, but they didn't go to get it.  We had ordered x-rays the prior year, and also referred to ortho for evaluation, but he never went to that visit. 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 °Mother reports limited activity.  Patient reports walking a lot, denies limitation of his activity by hip pain. °  °  °He sees the podiatrist regularly for nail debridement (last in 10/2021). °  °Hypothyroidism: °Last saw Dr. Levy in 06/2021. He hadn't been seen there since 11/2019.  He hadn't been compliant with his thyroid medication. TSH was elevated at 32 in 11/2020, and was up to 43.200 at his visit in 06/2021.  He was supposed to take Synthroid 100mcg M-F and return for labs in 6 weeks and 3 months. He wanted his mom to monitor him taking his meds. °Dr. Levy had discussed the potential risks of noncompliance, including CHF, in addition to the symptoms they had reported at their visit. °  °He has not followed up with endo as directed. °  °Vitamin D deficiency:  Dr. Levy did not feel that active supplementation was necessary unless the Vitamin D level was under 20. He had normal chem panel 06/2021. Recheck at his endo visit in August was 28.0. °  °He reports his diet is good--his favorite food is salads.  He likes eating salads, fruit salad, burgers. Mother reports that he is still getting up in the middle of the night and eating, but does report he eats more salads.  °  °OSA--he is not using CPAP. Previously reported with at-home sleep test that it "didn't do right".  Needed to be repeated. Ordered last year, not done. °  °H/o Achalasia, treated by Dr. Outlaw. He is on omeprazole. Last seen 07/2020.  ??no records UPDATE °He currently denies  any chest pain, dysphagia.   °  °Immunization History  °Administered Date(s) Administered  ° DTaP 01/31/1992, 04/10/1992, 06/19/1992, 04/16/1993, 09/27/1996  ° Hepatitis A 02/05/2009, 03/19/2011  ° Hepatitis B 04/10/1992, 06/19/1992, 04/16/1993  ° HiB (PRP-OMP) 01/31/1992, 04/10/1992, 06/19/1992, 04/16/1993  ° IPV 01/31/1992, 04/10/1992, 04/16/1993, 09/27/1996  ° Influenza Split 11/12/2006, 11/26/2007, 09/28/2008, 09/02/2012  ° Influenza,inj,Quad PF,6+ Mos 09/08/2013, 08/31/2014, 10/03/2015, 10/08/2017, 11/29/2018, 11/30/2019, 10/15/2020  ° MMR 10/01/1993, 09/27/1996  ° Meningococcal Conjugate 02/05/2009  ° PFIZER(Purple Top)SARS-COV-2 Vaccination 12/03/2020  ° Pneumococcal Polysaccharide-23 09/15/2013  ° Td 11/12/2006  ° Tdap 03/19/2011  ° Varicella 09/25/1998, 11/26/2007  ° ° °Dentist:  UTD °Ophtho:  past due °Hearing screen:  He previously had a hearing aid for his L ear, doesn't use it (and lost it when they moved, mom thinks he threw them away).  He denies hearing concerns. °Previously went to [facility], and also saw Dr. Krauss °Exercise:  walks the dog. Mom reports this is minimal °Walks every day ("two hours" per pt, mother denies) °  °Lipids: °Lab Results  °Component Value Date  ° CHOL 120 11/30/2018  ° HDL 39 (L) 11/30/2018  ° LDLCALC 61 11/30/2018  ° TRIG 101 11/30/2018  ° CHOLHDL 3.1 11/30/2018  ° ° °  °Other doctors caring for this patient: °Podiatrist: Dr. Mayer °Endocrinologist: Dr. Matthew Levy °Dentist: Dr. Saraf °Ophtho: Dr. Young (discharged due to missed visits) °  GI: Dr. Outlaw °ENT: Dr. Krauss (retired) °Audiologist: [facility] (had hearing aid for left ear, lost it when they moved, wasn't using it) °  °Falls: none °Depression: negative screen °  ° °PMH, PSH, SH reviewed °No changes to FH per mother, who filled out paperwork. °  °  °  °ROS: no fever, chills, headaches, change in appetite. Denies exertional chest pain, shortness of breath. Denies URI symptoms, cough. °Denies nausea,  vomiting, heartburn, bowel changes. °Denies urinary complaints.  °+left hip pain.   °No bleeding, bruising, rash. °Moods are good, per patient.  see HPI. °  °  °PHYSICAL EXAM: °  °There were no vitals taken for this visit. ° °Wt Readings from Last 3 Encounters:  °12/03/20 112 lb 6.4 oz (51 kg)  °09/08/20 121 lb 4.1 oz (55 kg)  °11/30/19 116 lb 6.4 oz (52.8 kg)  ° °  °  °Pleasant male, in good spirits, in no distress. °He is alert.  He is less vocal than at last visit--using thumbs up and thumbs down, yes and now. He is speaking quietly, rushed, slight stutter, difficult to understand. °HEENT: PERRL, EOMI, conjunctiva clear.  Fundi not well visualized. R TM is partly obscured by cerumen, completely obscured on the left. Wearing mask due to COVID-19 pandemic, though was under his chin for part of the visit.  OP is normal. °Neck:/spine: spine is nontender. No CVA tenderness or SI tenderness.  No lymphadenopathy, thyromegaly or mass. °Heart: regular rate and rhythm without murmur °Chest wall nontender to palpation °Lungs: clear bilaterally °Abdomen: normal bowel sounds, soft, nontender, no organomegaly or mass. °GU: normal genitalia without lesions or hernias. Testicles are descended bilaterally, no masses °Skin: normal turgor, no rashes or bruising.  °Psych: normal mood, affect, hygiene and grooming. Normal eye contact. Speech is difficult to understand. °Neuro: alert, normal strength, sensation, gait °Extremities: normal pulses, no edema.  Thickened, long, onychomycotic nails on all toes. Some callouses on the distal toes. °  °Cerumen? °Update feet °  °  °ASSESSMENT/PLAN: °  °Never returned for 2nd COVID vaccine. °Did he get it elsewhere? °Was also supposed to get Tdap from pharmacy, did he? °Flu shot--give if hasn't had yet. °  °What happened with home sleep test ordered last year? °  °Noncompliant with f/u at endo (supposed to have been back twice for recheck of TSH since August)--did he ever go to [facility] to get  labs repeated??  Nothing in [facility], want to verify whether or not they got the labs done. °  °MVI rec (with D3) °  °Patient was counseled re: healthy diet, exercise recommendations, safe sex. Encouraged taking his medications daily. °

## 2021-12-04 ENCOUNTER — Ambulatory Visit: Payer: Medicare Other | Admitting: Family Medicine

## 2021-12-04 DIAGNOSIS — E559 Vitamin D deficiency, unspecified: Secondary | ICD-10-CM

## 2021-12-04 DIAGNOSIS — E038 Other specified hypothyroidism: Secondary | ICD-10-CM

## 2021-12-04 DIAGNOSIS — Z0000 Encounter for general adult medical examination without abnormal findings: Secondary | ICD-10-CM

## 2021-12-04 DIAGNOSIS — Q909 Down syndrome, unspecified: Secondary | ICD-10-CM

## 2021-12-04 DIAGNOSIS — H9190 Unspecified hearing loss, unspecified ear: Secondary | ICD-10-CM

## 2021-12-04 DIAGNOSIS — G4733 Obstructive sleep apnea (adult) (pediatric): Secondary | ICD-10-CM

## 2021-12-15 NOTE — Patient Instructions (Incomplete)

## 2021-12-15 NOTE — Progress Notes (Deleted)
No chief complaint on file.    Ronald Le is a 31 y.o. male who presents for a complete physical and f/u on chronic problems.   Patient has Down's Syndrome, limited language   Last year he reported ongoing L hip pain (unchanged/chronic).  X-ray was ordered, but they didn't go to get it.  We had ordered x-rays the prior year, and also referred to ortho for evaluation, but he never went to that visit. 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 Mother reports limited activity.  Patient reports walking a lot, denies limitation of his activity by hip pain.     He sees the podiatrist regularly for nail debridement (last in 10/2021).   Hypothyroidism: Last saw Dr. Hartford Poli in 06/2021. He hadn't been seen there since 11/2019.  He hadn't been compliant with his thyroid medication. TSH was elevated at 32 in 11/2020, and was up to 43.200 at his visit in 06/2021.  He was supposed to take Synthroid 174mg M-F and return for labs in 6 weeks and 3 months. He wanted his mom to monitor him taking his meds. Dr. LHartford Polihad discussed the potential risks of noncompliance, including CHF, in addition to the symptoms they had reported at their visit.   He has not followed up with endo as directed.   Vitamin D deficiency:  Dr. LHartford Polidid not feel that active supplementation was necessary unless the Vitamin D level was under 20. He had normal chem panel 06/2021. Recheck at his endo visit in August was 28.0.   He reports his diet is good--his favorite food is salads.  He likes eating salads, fruit salad, burgers. Mother reports that he is still getting up in the middle of the night and eating, but does report he eats more salads.    OSA--he is not using CPAP. Previously reported with at-home sleep test that it "didn't do right".  Needed to be repeated. Ordered last year, not done.   H/o Achalasia, treated by Dr. OPaulita Fujita He is on omeprazole. Last seen 07/2020.  ??no records UPDATE He currently denies  any chest pain, dysphagia.     Immunization History  Administered Date(s) Administered   DTaP 01/31/1992, 04/10/1992, 06/19/1992, 04/16/1993, 09/27/1996   Hepatitis A 02/05/2009, 03/19/2011   Hepatitis B 04/10/1992, 06/19/1992, 04/16/1993   HiB (PRP-OMP) 01/31/1992, 04/10/1992, 06/19/1992, 04/16/1993   IPV 01/31/1992, 04/10/1992, 04/16/1993, 09/27/1996   Influenza Split 11/12/2006, 11/26/2007, 09/28/2008, 09/02/2012   Influenza,inj,Quad PF,6+ Mos 09/08/2013, 08/31/2014, 10/03/2015, 10/08/2017, 11/29/2018, 11/30/2019, 10/15/2020   MMR 10/01/1993, 09/27/1996   Meningococcal Conjugate 02/05/2009   PFIZER(Purple Top)SARS-COV-2 Vaccination 12/03/2020   Pneumococcal Polysaccharide-23 09/15/2013   Td 11/12/2006   Tdap 03/19/2011   Varicella 09/25/1998, 11/26/2007    Dentist:  UTD Ophtho:  past due Hearing screen:  He previously had a hearing aid for his L ear, doesn't use it (and lost it when they moved, mom thinks he threw them away).  He denies hearing concerns. [facility] and also [facility] Dr. KCresenciano LickExercise:  walks the dog. Mom reports this is minimal Walks every day ("two hours" per pt, mother denies)   Lipids: Lab Results  Component Value Date   CHOL 120 11/30/2018   HDL 39 (L) 11/30/2018   LDLCALC 61 11/30/2018   TRIG 101 11/30/2018   CHOLHDL 3.1 11/30/2018      Other doctors caring for this patient: Podiatrist: Dr. MPrudence DavidsonEndocrinologist: Dr. MFrancetta FoundDentist: Dr. SImagene RichesOphtho: Dr. YAnnamaria Boots(discharged due to missed visits)  GI: Dr. Paulita Fujita ENT: Dr. Cresenciano Lick (retired) Audiologist: Medical laboratory scientific officer (had hearing aid for left ear, lost it when they moved, wasn't using it)   Falls: none Depression: negative screen    PMH, PSH, SH reviewed No changes to [place] per mother, who filled out paperwork.       ROS: no fever, chills, headaches, change in appetite. Denies exertional chest pain, shortness of breath. Denies URI symptoms, cough. Denies nausea,  vomiting, heartburn, bowel changes. Denies urinary complaints.  +left hip pain.   No bleeding, bruising, rash. Moods are good, per patient.  see HPI.     PHYSICAL EXAM:   There were no vitals taken for this visit.  Wt Readings from Last 3 Encounters:  12/03/20 112 lb 6.4 oz (51 kg)  09/08/20 121 lb 4.1 oz (55 kg)  11/30/19 116 lb 6.4 oz (52.8 kg)       Pleasant male, in good spirits, in no distress. He is alert.  He is less vocal than at last visit--using thumbs up and thumbs down, yes and now. He is speaking quietly, rushed, slight stutter, difficult to understand. HEENT: PERRL, EOMI, conjunctiva clear.  Fundi not well visualized. R TM is partly obscured by cerumen, completely obscured on the left. Wearing mask due to COVID-19 pandemic, though was under his chin for part of the visit.  OP is normal. Neck:/spine: spine is nontender. No CVA tenderness or SI tenderness.  No lymphadenopathy, thyromegaly or mass. Heart: regular rate and rhythm without murmur Chest wall nontender to palpation Lungs: clear bilaterally Abdomen: normal bowel sounds, soft, nontender, no organomegaly or mass. GU: normal genitalia without lesions or hernias. Testicles are descended bilaterally, no masses Skin: normal turgor, no rashes or bruising.  Psych: normal mood, affect, hygiene and grooming. Normal eye contact. Speech is difficult to understand. Neuro: alert, normal strength, sensation, gait Extremities: normal pulses, no edema.  Thickened, long, onychomycotic nails on all toes. Some callouses on the distal toes.   Cerumen? Update feet     ASSESSMENT/PLAN:   Never returned for 2nd COVID vaccine. Did he get it elsewhere? Was also supposed to get Tdap from pharmacy, did he? Flu shot--give if hasn't had yet.   What happened with home sleep test ordered last year?   Noncompliant with f/u at endo (supposed to have been back twice for recheck of TSH since August)--did he ever go to [facility] to get  labs repeated??  Nothing in [place], want to verify whether or not they got the labs done.   MVI rec (with D3)   Patient was counseled re: healthy diet, exercise recommendations, safe sex. Encouraged taking his medications daily.

## 2021-12-18 ENCOUNTER — Ambulatory Visit: Payer: Commercial Managed Care - HMO | Admitting: Family Medicine

## 2021-12-18 ENCOUNTER — Telehealth: Payer: Self-pay | Admitting: *Deleted

## 2021-12-18 DIAGNOSIS — G4733 Obstructive sleep apnea (adult) (pediatric): Secondary | ICD-10-CM

## 2021-12-18 DIAGNOSIS — M25552 Pain in left hip: Secondary | ICD-10-CM

## 2021-12-18 DIAGNOSIS — Z0000 Encounter for general adult medical examination without abnormal findings: Secondary | ICD-10-CM

## 2021-12-18 DIAGNOSIS — E038 Other specified hypothyroidism: Secondary | ICD-10-CM

## 2021-12-18 DIAGNOSIS — E559 Vitamin D deficiency, unspecified: Secondary | ICD-10-CM

## 2021-12-18 DIAGNOSIS — Q909 Down syndrome, unspecified: Secondary | ICD-10-CM

## 2021-12-18 DIAGNOSIS — H9190 Unspecified hearing loss, unspecified ear: Secondary | ICD-10-CM

## 2021-12-18 NOTE — Telephone Encounter (Signed)
He no-showed today's visit, which was rescheduled after a same-day cancellation recently (mother had vertigo). Needs no show letter, and assess fee.  I think he needs to be discharged, if it looks like he meets criteria.

## 2021-12-18 NOTE — Telephone Encounter (Signed)

## 2021-12-19 ENCOUNTER — Encounter: Payer: Self-pay | Admitting: Family Medicine

## 2021-12-19 NOTE — Telephone Encounter (Signed)
Dismissal sent 

## 2022-01-22 DIAGNOSIS — E039 Hypothyroidism, unspecified: Secondary | ICD-10-CM | POA: Diagnosis not present

## 2022-01-27 ENCOUNTER — Encounter: Payer: Self-pay | Admitting: Podiatry

## 2022-01-27 ENCOUNTER — Ambulatory Visit (INDEPENDENT_AMBULATORY_CARE_PROVIDER_SITE_OTHER): Payer: Medicare Other | Admitting: Podiatry

## 2022-01-27 ENCOUNTER — Other Ambulatory Visit: Payer: Self-pay

## 2022-01-27 DIAGNOSIS — B351 Tinea unguium: Secondary | ICD-10-CM | POA: Diagnosis not present

## 2022-01-27 DIAGNOSIS — M79675 Pain in left toe(s): Secondary | ICD-10-CM | POA: Diagnosis not present

## 2022-01-27 DIAGNOSIS — M79674 Pain in right toe(s): Secondary | ICD-10-CM

## 2022-01-27 NOTE — Progress Notes (Signed)
Complaint:  Visit Type: Patient returns to my office for continued preventative foot care services. Complaint: Patient states" my nails have grown long and thick and become painful to walk and wear shoes"  The patient presents for preventative foot care services. No changes to ROS. Patient has Downs syndrome and is accompanied by his mother.  Podiatric Exam: Vascular: dorsalis pedis and posterior tibial pulses are palpable bilateral. Capillary return is immediate. Temperature gradient is WNL. Skin turgor WNL  Sensorium: Normal Semmes Weinstein monofilament test. Normal tactile sensation bilaterally. Nail Exam: Pt has thick disfigured discolored nails with subungual debris noted bilateral entire nail hallux through fifth toenails Ulcer Exam: There is no evidence of ulcer or pre-ulcerative changes or infection. Orthopedic Exam: Muscle tone and strength are WNL. No limitations in general ROM. No crepitus or effusions noted. Foot type and digits show no abnormalities. HAV  B/L. Skin: No Porokeratosis. No infection or ulcers  Diagnosis:  Onychomycosis, , Pain in right toe, pain in left toes  Treatment & Plan Procedures and Treatment: Consent by patient was obtained for treatment procedures.   Debridement of mycotic and hypertrophic toenails, 1 through 5 bilateral and clearing of subungual debris. No ulceration, no infection noted.  Return Visit-Office Procedure: Patient instructed to return to the office for a follow up visit 12  weeks  for continued evaluation and treatment.    Ivis Henneman DPM 

## 2022-04-09 DIAGNOSIS — E039 Hypothyroidism, unspecified: Secondary | ICD-10-CM | POA: Diagnosis not present

## 2022-04-29 ENCOUNTER — Ambulatory Visit (INDEPENDENT_AMBULATORY_CARE_PROVIDER_SITE_OTHER): Payer: Medicare Other | Admitting: Podiatry

## 2022-04-29 ENCOUNTER — Encounter: Payer: Self-pay | Admitting: Podiatry

## 2022-04-29 DIAGNOSIS — M79674 Pain in right toe(s): Secondary | ICD-10-CM

## 2022-04-29 DIAGNOSIS — B351 Tinea unguium: Secondary | ICD-10-CM

## 2022-04-29 DIAGNOSIS — M79675 Pain in left toe(s): Secondary | ICD-10-CM | POA: Diagnosis not present

## 2022-04-29 NOTE — Progress Notes (Signed)
Complaint:  Visit Type: Patient returns to my office for continued preventative foot care services. Complaint: Patient states" my nails have grown long and thick and become painful to walk and wear shoes"  The patient presents for preventative foot care services. No changes to ROS. Patient has Downs syndrome and is accompanied by his mother.  Podiatric Exam: Vascular: dorsalis pedis and posterior tibial pulses are palpable bilateral. Capillary return is immediate. Temperature gradient is WNL. Skin turgor WNL  Sensorium: Normal Semmes Weinstein monofilament test. Normal tactile sensation bilaterally. Nail Exam: Pt has thick disfigured discolored nails with subungual debris noted bilateral entire nail hallux through fifth toenails Ulcer Exam: There is no evidence of ulcer or pre-ulcerative changes or infection. Orthopedic Exam: Muscle tone and strength are WNL. No limitations in general ROM. No crepitus or effusions noted. Foot type and digits show no abnormalities. HAV  B/L. Skin: No Porokeratosis. No infection or ulcers  Diagnosis:  Onychomycosis, , Pain in right toe, pain in left toes  Treatment & Plan Procedures and Treatment: Consent by patient was obtained for treatment procedures.   Debridement of mycotic and hypertrophic toenails, 1 through 5 bilateral and clearing of subungual debris. No ulceration, no infection noted.  Return Visit-Office Procedure: Patient instructed to return to the office for a follow up visit 12  weeks  for continued evaluation and treatment.    Dealie Koelzer DPM 

## 2022-05-18 ENCOUNTER — Emergency Department (HOSPITAL_COMMUNITY): Payer: Medicare Other

## 2022-05-18 ENCOUNTER — Inpatient Hospital Stay (HOSPITAL_COMMUNITY): Payer: Medicare Other

## 2022-05-18 ENCOUNTER — Encounter (HOSPITAL_COMMUNITY): Payer: Self-pay | Admitting: Pharmacy Technician

## 2022-05-18 ENCOUNTER — Encounter (HOSPITAL_COMMUNITY): Payer: Self-pay

## 2022-05-18 ENCOUNTER — Ambulatory Visit (HOSPITAL_COMMUNITY)
Admission: EM | Admit: 2022-05-18 | Discharge: 2022-05-18 | Disposition: A | Discharge status: Home / Self Care | Payer: Medicare Other

## 2022-05-18 ENCOUNTER — Other Ambulatory Visit: Payer: Self-pay

## 2022-05-18 ENCOUNTER — Inpatient Hospital Stay (HOSPITAL_COMMUNITY)
Admission: EM | Admit: 2022-05-18 | Discharge: 2022-05-22 | DRG: 871 | Disposition: A | Discharge status: Home / Self Care | Payer: Medicare Other | Source: Ambulatory Visit | Attending: Internal Medicine | Admitting: Internal Medicine

## 2022-05-18 DIAGNOSIS — Z79890 Hormone replacement therapy (postmenopausal): Secondary | ICD-10-CM | POA: Diagnosis not present

## 2022-05-18 DIAGNOSIS — E038 Other specified hypothyroidism: Secondary | ICD-10-CM

## 2022-05-18 DIAGNOSIS — K220 Achalasia of cardia: Secondary | ICD-10-CM | POA: Diagnosis present

## 2022-05-18 DIAGNOSIS — R051 Acute cough: Secondary | ICD-10-CM

## 2022-05-18 DIAGNOSIS — E039 Hypothyroidism, unspecified: Secondary | ICD-10-CM | POA: Diagnosis not present

## 2022-05-18 DIAGNOSIS — E860 Dehydration: Secondary | ICD-10-CM | POA: Diagnosis present

## 2022-05-18 DIAGNOSIS — R079 Chest pain, unspecified: Secondary | ICD-10-CM | POA: Diagnosis not present

## 2022-05-18 DIAGNOSIS — Z681 Body mass index (BMI) 19 or less, adult: Secondary | ICD-10-CM | POA: Diagnosis not present

## 2022-05-18 DIAGNOSIS — Q909 Down syndrome, unspecified: Secondary | ICD-10-CM | POA: Diagnosis not present

## 2022-05-18 DIAGNOSIS — E46 Unspecified protein-calorie malnutrition: Secondary | ICD-10-CM | POA: Diagnosis present

## 2022-05-18 DIAGNOSIS — R6510 Systemic inflammatory response syndrome (SIRS) of non-infectious origin without acute organ dysfunction: Secondary | ICD-10-CM | POA: Diagnosis not present

## 2022-05-18 DIAGNOSIS — R5381 Other malaise: Secondary | ICD-10-CM | POA: Diagnosis not present

## 2022-05-18 DIAGNOSIS — Z79899 Other long term (current) drug therapy: Secondary | ICD-10-CM | POA: Diagnosis not present

## 2022-05-18 DIAGNOSIS — J189 Pneumonia, unspecified organism: Secondary | ICD-10-CM | POA: Diagnosis present

## 2022-05-18 DIAGNOSIS — E876 Hypokalemia: Secondary | ICD-10-CM | POA: Diagnosis not present

## 2022-05-18 DIAGNOSIS — J90 Pleural effusion, not elsewhere classified: Secondary | ICD-10-CM | POA: Diagnosis not present

## 2022-05-18 DIAGNOSIS — E440 Moderate protein-calorie malnutrition: Secondary | ICD-10-CM | POA: Diagnosis not present

## 2022-05-18 DIAGNOSIS — R000 Tachycardia, unspecified: Secondary | ICD-10-CM | POA: Diagnosis not present

## 2022-05-18 DIAGNOSIS — K219 Gastro-esophageal reflux disease without esophagitis: Secondary | ICD-10-CM | POA: Diagnosis present

## 2022-05-18 DIAGNOSIS — E873 Alkalosis: Secondary | ICD-10-CM | POA: Diagnosis not present

## 2022-05-18 DIAGNOSIS — K5900 Constipation, unspecified: Secondary | ICD-10-CM | POA: Diagnosis present

## 2022-05-18 DIAGNOSIS — J9601 Acute respiratory failure with hypoxia: Secondary | ICD-10-CM | POA: Diagnosis not present

## 2022-05-18 DIAGNOSIS — Z20822 Contact with and (suspected) exposure to covid-19: Secondary | ICD-10-CM | POA: Diagnosis present

## 2022-05-18 DIAGNOSIS — J918 Pleural effusion in other conditions classified elsewhere: Secondary | ICD-10-CM | POA: Diagnosis present

## 2022-05-18 DIAGNOSIS — A419 Sepsis, unspecified organism: Secondary | ICD-10-CM | POA: Diagnosis not present

## 2022-05-18 DIAGNOSIS — R059 Cough, unspecified: Secondary | ICD-10-CM | POA: Diagnosis not present

## 2022-05-18 LAB — CBC WITH DIFFERENTIAL/PLATELET
Abs Immature Granulocytes: 0.11 10*3/uL — ABNORMAL HIGH (ref 0.00–0.07)
Basophils Absolute: 0.1 10*3/uL (ref 0.0–0.1)
Basophils Relative: 1 %
Eosinophils Absolute: 0 10*3/uL (ref 0.0–0.5)
Eosinophils Relative: 0 %
HCT: 40.9 % (ref 39.0–52.0)
Hemoglobin: 13.8 g/dL (ref 13.0–17.0)
Immature Granulocytes: 1 %
Lymphocytes Relative: 7 %
Lymphs Abs: 0.9 10*3/uL (ref 0.7–4.0)
MCH: 30.6 pg (ref 26.0–34.0)
MCHC: 33.7 g/dL (ref 30.0–36.0)
MCV: 90.7 fL (ref 80.0–100.0)
Monocytes Absolute: 1.2 10*3/uL — ABNORMAL HIGH (ref 0.1–1.0)
Monocytes Relative: 10 %
Neutro Abs: 10.6 10*3/uL — ABNORMAL HIGH (ref 1.7–7.7)
Neutrophils Relative %: 81 %
Platelets: 405 10*3/uL — ABNORMAL HIGH (ref 150–400)
RBC: 4.51 MIL/uL (ref 4.22–5.81)
RDW: 13.2 % (ref 11.5–15.5)
WBC: 12.8 10*3/uL — ABNORMAL HIGH (ref 4.0–10.5)
nRBC: 0 % (ref 0.0–0.2)

## 2022-05-18 LAB — URINALYSIS, ROUTINE W REFLEX MICROSCOPIC
Bilirubin Urine: NEGATIVE
Glucose, UA: NEGATIVE mg/dL
Hgb urine dipstick: NEGATIVE
Ketones, ur: NEGATIVE mg/dL
Leukocytes,Ua: NEGATIVE
Nitrite: NEGATIVE
Protein, ur: 30 mg/dL — AB
Specific Gravity, Urine: 1.02 (ref 1.005–1.030)
pH: 5 (ref 5.0–8.0)

## 2022-05-18 LAB — CK: Total CK: 26 U/L — ABNORMAL LOW (ref 49–397)

## 2022-05-18 LAB — COMPREHENSIVE METABOLIC PANEL
ALT: 25 U/L (ref 0–44)
AST: 25 U/L (ref 15–41)
Albumin: 2.7 g/dL — ABNORMAL LOW (ref 3.5–5.0)
Alkaline Phosphatase: 64 U/L (ref 38–126)
Anion gap: 12 (ref 5–15)
BUN: 9 mg/dL (ref 6–20)
CO2: 29 mmol/L (ref 22–32)
Calcium: 8.7 mg/dL — ABNORMAL LOW (ref 8.9–10.3)
Chloride: 94 mmol/L — ABNORMAL LOW (ref 98–111)
Creatinine, Ser: 1.03 mg/dL (ref 0.61–1.24)
GFR, Estimated: >60 mL/min (ref >60)
Glucose, Bld: 132 mg/dL — ABNORMAL HIGH (ref 70–99)
Potassium: 3.3 mmol/L — ABNORMAL LOW (ref 3.5–5.1)
Sodium: 135 mmol/L (ref 135–145)
Total Bilirubin: 0.7 mg/dL (ref 0.3–1.2)
Total Protein: 7.6 g/dL (ref 6.5–8.1)

## 2022-05-18 LAB — PROTIME-INR
INR: 1.4 — ABNORMAL HIGH (ref 0.8–1.2)
Prothrombin Time: 17.4 seconds — ABNORMAL HIGH (ref 11.4–15.2)

## 2022-05-18 LAB — BLOOD GAS, VENOUS
Acid-Base Excess: 7.5 mmol/L — ABNORMAL HIGH (ref 0.0–2.0)
Bicarbonate: 32.7 mmol/L — ABNORMAL HIGH (ref 20.0–28.0)
O2 Saturation: 76.8 %
Patient temperature: 36.7
pCO2, Ven: 46 mmHg (ref 44–60)
pH, Ven: 7.45 — ABNORMAL HIGH (ref 7.25–7.43)
pO2, Ven: 41 mmHg (ref 32–45)

## 2022-05-18 LAB — PROCALCITONIN: Procalcitonin: 0.17 ng/mL

## 2022-05-18 LAB — PHOSPHORUS: Phosphorus: 3 mg/dL (ref 2.5–4.6)

## 2022-05-18 LAB — SARS CORONAVIRUS 2 BY RT PCR: SARS Coronavirus 2 by RT PCR: NEGATIVE

## 2022-05-18 LAB — LACTIC ACID, PLASMA: Lactic Acid, Venous: 1.4 mmol/L (ref 0.5–1.9)

## 2022-05-18 LAB — APTT: aPTT: 33 seconds (ref 24–36)

## 2022-05-18 LAB — TROPONIN I (HIGH SENSITIVITY)
Troponin I (High Sensitivity): 8 ng/L (ref <18)
Troponin I (High Sensitivity): 6 ng/L (ref <18)

## 2022-05-18 LAB — HIV ANTIBODY (ROUTINE TESTING W REFLEX): HIV Screen 4th Generation wRfx: NONREACTIVE

## 2022-05-18 LAB — TSH: TSH: 0.251 u[IU]/mL — ABNORMAL LOW (ref 0.350–4.500)

## 2022-05-18 LAB — MAGNESIUM: Magnesium: 2 mg/dL (ref 1.7–2.4)

## 2022-05-18 MED ORDER — SODIUM CHLORIDE 0.9 % IV SOLN: 2.0000 g | INTRAVENOUS | Status: DC

## 2022-05-18 MED ORDER — SODIUM CHLORIDE 0.9 % IV BOLUS
1000.0000 mL | Freq: Once | INTRAVENOUS | Status: AC
  Administered 2022-05-18: 1000 mL via INTRAVENOUS

## 2022-05-18 MED ORDER — ALBUTEROL SULFATE (2.5 MG/3ML) 0.083% IN NEBU: 2.5000 mg | INHALATION_SOLUTION | RESPIRATORY_TRACT | Status: DC | PRN

## 2022-05-18 MED ORDER — LEVOTHYROXINE SODIUM 112 MCG PO TABS
112.0000 ug | ORAL_TABLET | Freq: Every day | ORAL | Status: DC
  Administered 2022-05-19: 112 ug via ORAL
  Filled 2022-05-18: qty 1

## 2022-05-18 MED ORDER — SODIUM CHLORIDE 0.9 % IV SOLN
500.0000 mg | INTRAVENOUS | Status: DC
  Administered 2022-05-19 – 2022-05-21 (×3): 500 mg via INTRAVENOUS
  Filled 2022-05-18 (×4): qty 5

## 2022-05-18 MED ORDER — ACETAMINOPHEN 325 MG PO TABS
650.0000 mg | ORAL_TABLET | Freq: Once | ORAL | Status: AC
  Administered 2022-05-18: 650 mg via ORAL
  Filled 2022-05-18: qty 2

## 2022-05-18 MED ORDER — IOHEXOL 350 MG/ML SOLN
60.0000 mL | Freq: Once | INTRAVENOUS | Status: AC | PRN
  Administered 2022-05-18: 60 mL via INTRAVENOUS

## 2022-05-18 MED ORDER — SODIUM CHLORIDE 0.9 % IV SOLN
1.0000 g | Freq: Once | INTRAVENOUS | Status: AC
  Administered 2022-05-18: 1 g via INTRAVENOUS
  Filled 2022-05-18: qty 10

## 2022-05-18 MED ORDER — ACETAMINOPHEN 325 MG PO TABS
650.0000 mg | ORAL_TABLET | Freq: Four times a day (QID) | ORAL | Status: DC | PRN
  Administered 2022-05-18 – 2022-05-21 (×7): 650 mg via ORAL
  Filled 2022-05-18 (×7): qty 2

## 2022-05-18 MED ORDER — DOCUSATE SODIUM 100 MG PO CAPS
100.0000 mg | ORAL_CAPSULE | Freq: Two times a day (BID) | ORAL | Status: DC
  Administered 2022-05-18 – 2022-05-22 (×8): 100 mg via ORAL
  Filled 2022-05-18 (×8): qty 1

## 2022-05-18 MED ORDER — GUAIFENESIN ER 600 MG PO TB12
600.0000 mg | ORAL_TABLET | Freq: Two times a day (BID) | ORAL | Status: DC
  Administered 2022-05-18 – 2022-05-22 (×8): 600 mg via ORAL
  Filled 2022-05-18 (×8): qty 1

## 2022-05-18 MED ORDER — PANTOPRAZOLE SODIUM 40 MG PO TBEC
40.0000 mg | DELAYED_RELEASE_TABLET | Freq: Every day | ORAL | Status: DC
  Administered 2022-05-19 – 2022-05-22 (×4): 40 mg via ORAL
  Filled 2022-05-18 (×4): qty 1

## 2022-05-18 MED ORDER — POTASSIUM CHLORIDE 10 MEQ/100ML IV SOLN
10.0000 meq | INTRAVENOUS | Status: AC
  Administered 2022-05-18 (×2): 10 meq via INTRAVENOUS
  Filled 2022-05-18 (×2): qty 100

## 2022-05-18 MED ORDER — SODIUM CHLORIDE 0.9 % IV SOLN
500.0000 mg | Freq: Once | INTRAVENOUS | Status: AC
  Administered 2022-05-18: 500 mg via INTRAVENOUS
  Filled 2022-05-18: qty 5

## 2022-05-18 MED ORDER — ACETAMINOPHEN 650 MG RE SUPP: 650.0000 mg | Freq: Four times a day (QID) | RECTAL | Status: DC | PRN

## 2022-05-18 MED ORDER — ENOXAPARIN SODIUM 40 MG/0.4ML IJ SOSY
40.0000 mg | PREFILLED_SYRINGE | INTRAMUSCULAR | Status: DC
Start: 2022-05-18 — End: 2022-05-19
  Administered 2022-05-18: 40 mg via SUBCUTANEOUS
  Filled 2022-05-18: qty 0.4

## 2022-05-18 MED ORDER — HYDROCODONE-ACETAMINOPHEN 5-325 MG PO TABS: 1.0000 | ORAL_TABLET | ORAL | Status: DC | PRN

## 2022-05-18 MED ORDER — SODIUM CHLORIDE 0.9 % IV SOLN: INTRAVENOUS | Status: AC

## 2022-05-18 NOTE — Assessment & Plan Note (Signed)
- -  Patient presenting with  productive cough , and infiltrate in   lower lobe on chest x-ray -Infiltrate on CXR and 2-3 characteristics (fever, leukocytosis, purulent sputum) are consistent with pneumonia. -This appears to be most likely community-acquired pneumonia.      will admit for treatment of CAP will start on appropriate antibiotic coverage. -  Rocephin/azithromycin   Obtain:  sputum cultures,                 blood cultures and sputum cultures ordered                   strep pneumo UA antigen,                   check for Legionella antigen.                Provide oxygen as needed.

## 2022-05-18 NOTE — Assessment & Plan Note (Signed)
Rehydrate and follow fluid status Noted decreased p.o. intake will obtain nutritional consult check prealbumin It is possibility that swallowing problems is what is contributing to decreased p.o. intake.  Will obtain speech pathology evaluation

## 2022-05-18 NOTE — ED Provider Notes (Signed)
[facility]    CSN: [id number] Arrival date & time: 05/18/22  1402      History   Chief Complaint Chief Complaint  Patient presents with   Cough    HPI Ronald Le is a 31 y.o. male.   Patient is a 31 year old male with a significant medical history of Down syndrome brought in by his mother for evaluation of cough for the last 2 to 3 days.  Yesterday, the cough became worse and he was reporting some shortness of breath and chest pain.  Mom states that the patient has been coughing so much that he almost throws up.  He has a history of pneumonia, but this was 15 years ago.  He is currently short of breath and nauseous.  He is reporting some left-sided chest pain and continues to clutch his left chest during interview.  It is difficult to get history from the patient as he uses thumbs up and thumbs down mostly to communicate with his mom to confirm or deny symptoms. Mom states that patient has been wearing thick clothing and a jacket inside of the cool house and believes he may have had a fever over the last couple of days.  Patient has not had any medication this morning and is afebrile in the clinic.  Mom denies history of asthma or any cardiovascular problems.   Cough   Past Medical History:  Diagnosis Date   Allergy    Candida esophagitis (HCC) 04/2012   after ABX for pneumonia   Chronic serous otitis media    Communication problem    "when he feels bad he just communicates w/thumbs up or down or shakes his head; very limited communication normally" (09/30/2012)   Complication of anesthesia 08/2012   "hard to wake up after balloon dilatation)   Complication of anesthesia 1999   "respiratory arrest after hip OR; had to get Narcan" (09/30/2012)   Down's syndrome    Dysphagia 09/30/2012   now resolved   Finger fracture 2009   h/o   GERD (gastroesophageal reflux disease)    "maybe" (09/30/2012)   Heart murmur    "@ birth; resolved" (09/30/2012)    Hypothyroidism    Legg-Calve-Perthes disease    OSA on CPAP    noncompliant with use of CPAP   Peristalsis    "almost absent" (09/30/2012)   Pneumonia    multiple hospitalizatons as toddler, but 10 day stay age 50yo   Viral syndrome 09/1995   hospital viral syndrome.    Patient Active Problem List   Diagnosis Date Noted   Malnutrition of moderate degree 05/20/2022   Malnourished (HCC) 05/19/2022   CAP (community acquired pneumonia) 05/18/2022   Sepsis (HCC) 05/18/2022   Hypokalemia 05/18/2022   Debility 05/18/2022   Vitamin D deficiency 10/04/2015   OSA (obstructive sleep apnea) 10/03/2015   Hearing loss 10/03/2015   Constipation 10/07/2013   Achalasia 11/08/2012   Esophageal diverticulum 05/12/2012   Onychomycosis 05/12/2012   Candida esophagitis (HCC) 05/03/2012   PNA (pneumonia) 05/01/2012   Dehydration 05/01/2012   Down's syndrome 04/29/2012   Hypothyroidism 04/29/2012   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    Past Surgical History:  Procedure Laterality Date   ADENOIDECTOMY AND MYRINGOTOMY WITH TUBE PLACEMENT  11/1996   bilaterally   BALLOON DILATION  09/01/2012   Procedure: BALLOON DILATION;  Surgeon: Willis Modena, MD;  Location: [facility];  Service: Endoscopy;  Laterality: N/A;   ESOPHAGOGASTRODUODENOSCOPY  10/01/2012   Procedure: ESOPHAGOGASTRODUODENOSCOPY (EGD);  Surgeon: Vertell Novak., MD;  Location: [facility] ENDOSCOPY;  Service: Endoscopy;  Laterality: N/A;   ESOPHAGOGASTRODUODENOSCOPY  10/01/12   ESOPHAGOGASTRODUODENOSCOPY (EGD) WITH PROPOFOL N/A 12/21/2013   Procedure: ESOPHAGOGASTRODUODENOSCOPY (EGD) WITH PROPOFOL;  Surgeon: Willis Modena, MD;  Location: [facility];  Service: Endoscopy;  Laterality: N/A;   GUM SURGERY  ~ 2004   graft to gums from roof of mouth.   HIP ARTHROPLASTY  10/1998   left; due to Legg-Calve-Perthe dz and subsequent surgery to remove hardware   LAPAROSCOPIC HELLER MYOTOMY  11/11/12   Dr.Farrell   laparoscopic  surgery  11-16-13    TYMPANOSTOMY TUBE PLACEMENT  11/1996   bilaterally       Home Medications    Prior to Admission medications   Medication Sig Start Date End Date Taking? Authorizing Provider  acetaminophen (TYLENOL) 650 MG CR tablet Take 1,300 mg by mouth every 8 (eight) hours as needed for pain.    [provider]  bismuth subsalicylate (PEPTO-BISMOL) 262 MG/15ML suspension 30 ml as needed Patient not taking: Reported on 12/03/2020    [provider]  ergocalciferol (VITAMIN D2) 1.25 MG (50000 UT) capsule Take 1 capsule (50,000 Units total) by mouth once a week. Patient not taking: Reported on 05/18/2022 12/10/20   Joselyn Arrow, MD  Fexofenadine HCl (ALLEGRA PO) Take 1 tablet by mouth daily as needed (allergy).    [provider]  ibuprofen (ADVIL) 200 MG tablet Take 600 mg by mouth every 6 (six) hours as needed for moderate pain.    [provider]  levothyroxine (SYNTHROID) 112 MCG tablet Take 112 mcg by mouth See admin instructions. Takes 1 tablet daily Monday through friday.    [provider]  omeprazole (PRILOSEC) 40 MG capsule Take 40 mg by mouth daily.    [provider]    Family History Family History  Problem Relation Age of Onset   Irritable bowel syndrome Mother    Interstitial cystitis Mother    Fibromyalgia Mother    Gallbladder disease Father        gallbladder surgery   Migraines Sister    Depression Sister        postpartum   Alzheimer's disease Maternal Grandmother        dementia.   Pyloric stenosis Brother        as baby   Hearing loss Maternal Grandfather        cochlear loss    Social History Social History   Tobacco Use   Smoking status: Never   Smokeless tobacco: Never  Vaping Use   Vaping Use: Never used  Substance Use Topics   Alcohol use: Not Currently   Drug use: No     Allergies   Patient has no known allergies.   Review of Systems Review of Systems  Respiratory:  Positive  for cough.   Per HPI   Physical Exam Triage Vital Signs ED Triage Vitals [05/18/22 1420]  Enc Vitals Group     BP 110/68     Pulse Rate (!) 130     Resp (!) 28     Temp 98.9 F (37.2 C)     Temp Source Oral     SpO2 92 %     Weight      Height      Head Circumference      Peak Flow      Pain Score      Pain Loc      Pain Edu?  Excl. in GC?    No data found.  Updated Vital Signs BP 110/68 (BP Location: Right Arm)   Pulse (!) 130   Temp 98.9 F (37.2 C) (Oral)   Resp (!) 28   SpO2 92%   Visual Acuity Right Eye Distance:   Left Eye Distance:   Bilateral Distance:    Right Eye Near:   Left Eye Near:    Bilateral Near:     Physical Exam Vitals and nursing note reviewed.  Constitutional:      Appearance: He is ill-appearing.  HENT:     Head: Normocephalic and atraumatic.     Right Ear: Hearing, tympanic membrane, ear canal and external ear normal.     Left Ear: Hearing, tympanic membrane, ear canal and external ear normal.     Nose: Nose normal.     Mouth/Throat:     Lips: Pink.     Mouth: Mucous membranes are dry.     Pharynx: No posterior oropharyngeal erythema.  Eyes:     General: Lids are normal. Vision grossly intact. Gaze aligned appropriately.     Extraocular Movements: Extraocular movements intact.     Conjunctiva/sclera: Conjunctivae normal.  Cardiovascular:     Rate and Rhythm: Normal rate and regular rhythm.     Heart sounds: Normal heart sounds, S1 normal and S2 normal.  Pulmonary:     Effort: Accessory muscle usage and retractions present.     Breath sounds: Normal breath sounds and air entry. No stridor. No wheezing or rhonchi.     Comments: Retractions present to respiratory exam. Abdominal:     General: Bowel sounds are normal.     Palpations: Abdomen is soft.     Tenderness: There is no abdominal tenderness. There is no right CVA tenderness, left CVA tenderness or guarding.  Musculoskeletal:     Cervical back: Neck supple.   Skin:    General: Skin is warm and dry.     Capillary Refill: Capillary refill takes less than 2 seconds.     Findings: No rash.     Comments: Slight maculopapular rash to patient's feet bilaterally.  Neurological:     General: No focal deficit present.     Mental Status: He is alert. Mental status is at baseline.     Cranial Nerves: No dysarthria or facial asymmetry.     Gait: Gait is intact.     Comments: Patient is neurologically at baseline and is able to answer questions with a thumbs up and thumbs down motion.  Psychiatric:        Mood and Affect: Mood normal.        Speech: Speech normal.        Behavior: Behavior normal. Behavior is cooperative.        Thought Content: Thought content normal.        Judgment: Judgment normal.      UC Treatments / Results  Labs (all labs ordered are listed, but only abnormal results are displayed) Labs Reviewed - No data to display  EKG   Radiology No results found.  Procedures Procedures (including critical care time)  Medications Ordered in UC Medications - No data to display  Initial Impression / Assessment and Plan / UC Course  I have reviewed the triage vital signs and the nursing notes.  Pertinent labs & imaging results that were available during my care of the patient were reviewed by me and considered in my medical decision making (see chart for details).  Acute cough and tachycardia Symptomology and physical exam findings are concerning for possible intrathoracic abnormality.  No adventitious lung sounds heard to auscultation.  EKG obtained due to chest pain complaint shows sinus tachycardia without acute coronary abnormality with ventricular rate at 122 bpm.  Patient appears to be dehydrated based on physical exam findings and vital signs.  Oxygen saturation is 92% on room air and patient is showing visible signs of increased work of breathing with accessory muscle usage and retractions.  Patient has history of  hospitalization related to pneumonia infection.  Patient would benefit from emergency department visit for further work-up and evaluation to rule out acute cardiopulmonary abnormality.    Discussed physical exam and EKG findings with mom as well as further recommendations for transport to the emergency room.  Mom verbalizes understanding and agreement with plan.  Discussed risks of deferring emergency department visit for further evaluation and work-up.  Offered [facility] transport, but mom declines and states that she would like to transport her son via personal vehicle.  Patient is stable at time of discharge from urgent care with plan to report directly to [facility] emergency department.   Final Clinical Impressions(s) / UC Diagnoses   Final diagnoses:  Tachycardia  Chest pain, unspecified type  Acute cough     Discharge Instructions      Please report to [facility] emergency department for further evaluation and management of symptoms as you appear very dehydrated.      ED Prescriptions   None    PDMP not reviewed this encounter.   Carlisle Beers, [place] 05/20/22 2136

## 2022-05-18 NOTE — Assessment & Plan Note (Signed)
Status post repair at [facility]. Currently stable continue to monitor

## 2022-05-18 NOTE — ED Notes (Signed)
Pt has been discharged to the ED with his mom. Pt is stable on DC.

## 2022-05-18 NOTE — Assessment & Plan Note (Signed)
Continue Synthroid 112 mcg Monday through Friday check TSH

## 2022-05-19 DIAGNOSIS — K220 Achalasia of cardia: Secondary | ICD-10-CM | POA: Diagnosis not present

## 2022-05-19 DIAGNOSIS — E46 Unspecified protein-calorie malnutrition: Secondary | ICD-10-CM | POA: Diagnosis present

## 2022-05-19 DIAGNOSIS — J189 Pneumonia, unspecified organism: Secondary | ICD-10-CM | POA: Diagnosis not present

## 2022-05-19 DIAGNOSIS — A419 Sepsis, unspecified organism: Secondary | ICD-10-CM | POA: Diagnosis not present

## 2022-05-19 DIAGNOSIS — E038 Other specified hypothyroidism: Secondary | ICD-10-CM | POA: Diagnosis not present

## 2022-05-19 LAB — COMPREHENSIVE METABOLIC PANEL
ALT: 23 U/L (ref 0–44)
AST: 20 U/L (ref 15–41)
Albumin: 2.1 g/dL — ABNORMAL LOW (ref 3.5–5.0)
Alkaline Phosphatase: 49 U/L (ref 38–126)
Anion gap: 7 (ref 5–15)
BUN: 7 mg/dL (ref 6–20)
CO2: 26 mmol/L (ref 22–32)
Calcium: 7.9 mg/dL — ABNORMAL LOW (ref 8.9–10.3)
Chloride: 101 mmol/L (ref 98–111)
Creatinine, Ser: 0.85 mg/dL (ref 0.61–1.24)
GFR, Estimated: >60 mL/min (ref >60)
Glucose, Bld: 113 mg/dL — ABNORMAL HIGH (ref 70–99)
Potassium: 3.7 mmol/L (ref 3.5–5.1)
Sodium: 134 mmol/L — ABNORMAL LOW (ref 135–145)
Total Bilirubin: 0.9 mg/dL (ref 0.3–1.2)
Total Protein: 5.9 g/dL — ABNORMAL LOW (ref 6.5–8.1)

## 2022-05-19 LAB — PREALBUMIN: Prealbumin: <5 mg/dL — ABNORMAL LOW (ref 18–38)

## 2022-05-19 LAB — PROCALCITONIN: Procalcitonin: 0.89 ng/mL

## 2022-05-19 LAB — CBC
HCT: 33.6 % — ABNORMAL LOW (ref 39.0–52.0)
Hemoglobin: 11.7 g/dL — ABNORMAL LOW (ref 13.0–17.0)
MCH: 31.4 pg (ref 26.0–34.0)
MCHC: 34.8 g/dL (ref 30.0–36.0)
MCV: 90.1 fL (ref 80.0–100.0)
Platelets: 337 10*3/uL (ref 150–400)
RBC: 3.73 MIL/uL — ABNORMAL LOW (ref 4.22–5.81)
RDW: 13.2 % (ref 11.5–15.5)
WBC: 13.7 10*3/uL — ABNORMAL HIGH (ref 4.0–10.5)
nRBC: 0 % (ref 0.0–0.2)

## 2022-05-19 LAB — STREP PNEUMONIAE URINARY ANTIGEN: Strep Pneumo Urinary Antigen: NEGATIVE

## 2022-05-19 LAB — MRSA NEXT GEN BY PCR, NASAL: MRSA by PCR Next Gen: NOT DETECTED

## 2022-05-19 MED ORDER — ENOXAPARIN SODIUM 30 MG/0.3ML IJ SOSY
30.0000 mg | PREFILLED_SYRINGE | INTRAMUSCULAR | Status: DC
  Administered 2022-05-19 – 2022-05-21 (×3): 30 mg via SUBCUTANEOUS
  Filled 2022-05-19 (×3): qty 0.3

## 2022-05-19 MED ORDER — SODIUM CHLORIDE 0.9 % IV SOLN
2.0000 g | Freq: Once | INTRAVENOUS | Status: AC
  Administered 2022-05-19: 2 g via INTRAVENOUS
  Filled 2022-05-19: qty 12.5

## 2022-05-19 MED ORDER — SODIUM CHLORIDE 0.9 % IV SOLN
2.0000 g | Freq: Three times a day (TID) | INTRAVENOUS | Status: DC
  Administered 2022-05-19 – 2022-05-22 (×9): 2 g via INTRAVENOUS
  Filled 2022-05-19 (×9): qty 12.5

## 2022-05-19 MED ORDER — LEVOTHYROXINE SODIUM 100 MCG PO TABS
100.0000 ug | ORAL_TABLET | Freq: Every day | ORAL | Status: DC
Start: 2022-05-20 — End: 2022-05-22
  Administered 2022-05-20 – 2022-05-22 (×3): 100 ug via ORAL
  Filled 2022-05-19 (×3): qty 1

## 2022-05-19 NOTE — Hospital Course (Addendum)
Ronald Le is a 31 y.o. male with past medical history of Down syndrome, hypothyroidism, recurrent pneumonia presented to the hospital with cough, lethargy ,shortness of breath and productive sputum.  Patient has history of hard of hearing but has refused hearing aids and has not been verbal since he had his surgery for achalasia.  In the ED, patient was tachycardic, tachypneic with leukocytosis.  Chest x-ray showed left upper lobe and lingula consolidation. CT angiogram chest was negative for PE but extensive left upper and lingular lobe pneumonia with moderate left-sided effusion.  Patient was started on Rocephin and Zithromax and was then admitted to the hospital for further evaluation and treatment.    Following conditions were addressed during hospitalization,  Sepsis secondary to community-acquired pneumonia  MRSA PCR was negative.  Procalcitonin was positive with gradually trended down to 2.2 prior to discharge.  Patient was initially on supplemental oxygen which has been discontinued..  Leukocytosis has trended down.  Patient is afebrile.  At this time patient appears to be alert awake and at baseline.  Blood cultures have been negative so far in 4 days..  Strep pneumo urinary antigen and Legionella urinary negative.   Patient has responded with cefepime and Zithromax.  Will change to [facility] and Zithromax to complete 5-day course on discharge.  Recommend follow-up chest x-ray in 6 to 8 weeks with her primary care provider.  Hypothyroidism TSH low.  Synthroid dose has been decreased to 112 from 200 mcg.  TSH needs to be rechecked in 4 to 6 weeks.     Achalasia S/p repair at [facility].  Nonverbal since then.   Hypokalemia Resolved.  Latest potassium of 3.6.   Down's syndrome Continue supportive care.   Debility PT OT recommended no additional needs.     Moderate protein calorie malnutrition.  Present on admission.  Received nutritional supplements during hospitalization

## 2022-05-19 NOTE — Assessment & Plan Note (Signed)
Patient initially met sepsis criteria with leukocytosis, tachycardia and tachypnea, most likely source is pneumonia as evident by chest x-ray and CTA.  Preliminary blood cultures negative, urine cultures pending. MRSA PCR negative Procalcitonin positive with worsening to 0.89 today. Worsening of leukocytosis and cough. -Switch ceftriaxone with cefepime -Continue with Zithromax -Continue with supportive care

## 2022-05-19 NOTE — Evaluation (Signed)
Clinical/Bedside Swallow Evaluation Patient Details  Name: Ronald Le MRN: [id number] Date of Birth: [birth year]  Today's Date: 05/19/2022 Time: SLP Start Time (ACUTE ONLY): 1150 SLP Stop Time (ACUTE ONLY): 1204 SLP Time Calculation (min) (ACUTE ONLY): 14 min  Past Medical History:  Past Medical History:  Diagnosis Date   Allergy    Candida esophagitis (HCC) 04/2012   after ABX for pneumonia   Chronic serous otitis media    Communication problem    "when he feels bad he just communicates w/thumbs up or down or shakes his head; very limited communication normally" (09/30/2012)   Complication of anesthesia 08/2012   "hard to wake up after balloon dilatation)   Complication of anesthesia 1999   "respiratory arrest after hip OR; had to get Narcan" (09/30/2012)   Down's syndrome    Dysphagia 09/30/2012   now resolved   Finger fracture 2009   h/o   GERD (gastroesophageal reflux disease)    "maybe" (09/30/2012)   Heart murmur    "@ birth; resolved" (09/30/2012)   Hypothyroidism    Legg-Calve-Perthes disease    OSA on CPAP    noncompliant with use of CPAP   Peristalsis    "almost absent" (09/30/2012)   Pneumonia    multiple hospitalizatons as toddler, but 10 day stay age 4yo   Viral syndrome 09/1995   hospital viral syndrome.   Past Surgical History:  Past Surgical History:  Procedure Laterality Date   ADENOIDECTOMY AND MYRINGOTOMY WITH TUBE PLACEMENT  11/1996   bilaterally   BALLOON DILATION  09/01/2012   Procedure: BALLOON DILATION;  Surgeon: Willis Modena, MD;  Location: [facility];  Service: Endoscopy;  Laterality: N/A;   ESOPHAGOGASTRODUODENOSCOPY  10/01/2012   Procedure: ESOPHAGOGASTRODUODENOSCOPY (EGD);  Surgeon: Vertell Novak., MD;  Location: [facility] ENDOSCOPY;  Service: Endoscopy;  Laterality: N/A;   ESOPHAGOGASTRODUODENOSCOPY  10/01/12   ESOPHAGOGASTRODUODENOSCOPY (EGD) WITH PROPOFOL N/A 12/21/2013   Procedure: ESOPHAGOGASTRODUODENOSCOPY (EGD) WITH PROPOFOL;   Surgeon: Willis Modena, MD;  Location: [facility];  Service: Endoscopy;  Laterality: N/A;   GUM SURGERY  ~ 2004   graft to gums from roof of mouth.   HIP ARTHROPLASTY  10/1998   left; due to Legg-Calve-Perthe dz and subsequent surgery to remove hardware   LAPAROSCOPIC HELLER MYOTOMY  11/11/12   Dr.Farrell   laparoscopic surgery  11-16-13    TYMPANOSTOMY TUBE PLACEMENT  11/1996   bilaterally   HPI:  Ronald Le is a 31 y.o. male with medical history significant of down syndrome, hypothyroidism, recurrent pNA. Presented with cough, lethargy. Decreased PO intake, sips of liquids only. Has not been as verbal since he had surgery for his achalasia. CTA chest -   no PE, extensive of left-sided pneumonia with small/moderate pleural effusion.  History of achalasia s/p Heller myotomy December 2013. Esophagram 2018 Chronically tapered and mildly lobulated appearance of the mid and distal esophagus, although these segments were observed to approach normal distension/caliber at times. And a 12.5 mm barium tablet was able to pass into the stomach without significant delay. 2. Abnormally decreased esophageal motility, especially in the proximal thoracic esophagus which is patulous and where tertiary contractions occasionally occurred. 3. Spontaneous gastroesophageal reflux to the level of the thoracic inlet.    Assessment / Plan / Recommendation  Clinical Impression  Pt did not show any signs of oral or oropharyngeal dysphagia. He fed himself, but cannot give history due to limited verbal ability. Answers Y/N questions with a thumbs up or down. Attempted to call  mother but line was busy. He likely has a baseline esophageal dysphagia as previously documented. Pt had a myotomy in 2013 which can lead to increased episodes of reflux. Pt does eat and drink sitting upright in my observation and seems to take relatively small quantities, also requests sip after solids. Suspect pt has appropriate habits for  intake as this has been part of his baseline for over 10 years. No acute findings to warrant any modifications. Will sign off. SLP Visit Diagnosis: Dysphagia, unspecified (R13.10)    Aspiration Risk  Moderate aspiration risk    Diet Recommendation Regular;Thin liquid   Liquid Administration via: Straw;Cup Medication Administration: Whole meds with liquid Supervision: Patient able to self feed Compensations: Slow rate;Small sips/bites;Follow solids with liquid    Other  Recommendations Oral Care Recommendations: Oral care BID    Recommendations for follow up therapy are one component of a multi-disciplinary discharge planning process, led by the attending physician.  Recommendations may be updated based on patient status, additional functional criteria and insurance authorization.  Follow up Recommendations No SLP follow up      Assistance Recommended at Discharge    Functional Status Assessment    Frequency and Duration min 2x/week  2 weeks       Prognosis        Swallow Study   General HPI: Ronald Le is a 31 y.o. male with medical history significant of down syndrome, hypothyroidism, recurrent pNA. Presented with cough, lethargy. Decreased PO intake, sips of liquids only. Has not been as verbal since he had surgery for his achalasia. CTA chest -   no PE, extensive of left-sided pneumonia with small/moderate pleural effusion.  History of achalasia s/p Heller myotomy December 2013. Esophagram 2018 Chronically tapered and mildly lobulated appearance of the mid and distal esophagus, although these segments were observed to approach normal distension/caliber at times. And a 12.5 mm barium tablet was able to pass into the stomach without significant delay. 2. Abnormally decreased esophageal motility, especially in the proximal thoracic esophagus which is patulous and where tertiary contractions occasionally occurred. 3. Spontaneous gastroesophageal reflux to the level of the  thoracic inlet. Type of Study: Bedside Swallow Evaluation Previous Swallow Assessment: 2013 - primary esophageal. otherwise WNL. Diet Prior to this Study: Regular;Thin liquids Temperature Spikes Noted: No Respiratory Status: Room air History of Recent Intubation: No Behavior/Cognition: Alert;Cooperative Oral Cavity Assessment: Within Functional Limits Oral Care Completed by SLP: No Oral Cavity - Dentition: Adequate natural dentition Vision: Functional for self-feeding Self-Feeding Abilities: Able to feed self Patient Positioning: Upright in bed Baseline Vocal Quality: Normal Volitional Cough: Other (Comment) (refused due to pain) Volitional Swallow: Able to elicit    Oral/Motor/Sensory Function Overall Oral Motor/Sensory Function: Within functional limits   Ice Chips     Thin Liquid Thin Liquid: Within functional limits Presentation: Straw;Self Fed    Nectar Thick Nectar Thick Liquid: Not tested   Honey Thick Honey Thick Liquid: Not tested   Puree Puree: Within functional limits   Solid     Solid:  (did not accpet much, only nibbled on the graham) Presentation: Self Fed      Keyana Guevara, Riley Nearing 05/19/2022,12:08 PM

## 2022-05-20 DIAGNOSIS — J189 Pneumonia, unspecified organism: Secondary | ICD-10-CM | POA: Diagnosis not present

## 2022-05-20 DIAGNOSIS — K220 Achalasia of cardia: Secondary | ICD-10-CM | POA: Diagnosis not present

## 2022-05-20 DIAGNOSIS — R5381 Other malaise: Secondary | ICD-10-CM | POA: Diagnosis not present

## 2022-05-20 DIAGNOSIS — K5900 Constipation, unspecified: Secondary | ICD-10-CM | POA: Diagnosis not present

## 2022-05-20 DIAGNOSIS — E440 Moderate protein-calorie malnutrition: Secondary | ICD-10-CM | POA: Insufficient documentation

## 2022-05-20 LAB — PROCALCITONIN: Procalcitonin: 8.91 ng/mL

## 2022-05-20 LAB — URINE CULTURE: Culture: NO GROWTH

## 2022-05-20 LAB — LEGIONELLA PNEUMOPHILA SEROGP 1 UR AG: L. pneumophila Serogp 1 Ur Ag: NEGATIVE

## 2022-05-20 MED ORDER — ENSURE ENLIVE PO LIQD
237.0000 mL | Freq: Three times a day (TID) | ORAL | Status: DC
  Administered 2022-05-20 – 2022-05-22 (×6): 237 mL via ORAL

## 2022-05-20 MED ORDER — PHENOL 1.4 % MT LIQD
1.0000 | OROMUCOSAL | Status: DC | PRN
  Filled 2022-05-20: qty 177

## 2022-05-20 MED ORDER — ADULT MULTIVITAMIN W/MINERALS CH
1.0000 | ORAL_TABLET | Freq: Every day | ORAL | Status: DC
  Administered 2022-05-20 – 2022-05-22 (×3): 1 via ORAL
  Filled 2022-05-20 (×3): qty 1

## 2022-05-20 NOTE — Progress Notes (Signed)
  Transition of Care [facility]) Screening Note   Patient Details  Name: Ronald Le Date of Birth: February 09, 1991   [facility] of [facility]) CM/SW Contact:    Darrold Span, RN Phone Number: 05/20/2022, 1:57 PM    Transition of Care Department [facility]) has reviewed patient and no TOC needs have been identified at this time, note pt is home w/ mom. We will continue to monitor patient advancement through interdisciplinary progression rounds. If new patient transition needs arise, please place a TOC consult.

## 2022-05-20 NOTE — Progress Notes (Signed)
Progress Note   Patient: Ronald Le IHK:[id number] DOB: 04-30-1991 DOA: 05/18/2022     2   Brief hospital course: Ronald Le is a 31 y.o. male with past medical history of Down syndrome, hypothyroidism, recurrent pneumonia presented to the hospital with cough lethargy shortness of breath and productive sputum.  Patient has history of hard of hearing but has refused hearing aids and has not been verbal since he had his surgery for achalasia.  In the ED, patient was tachycardic, tachypneic with leukocytosis.  Chest x-ray showed left upper lobe and lingula consolidation. CT angiogram chest was negative for PE but extensive left upper and lingular lobe pneumonia with moderate left-sided effusion.  Patient was started on Rocephin and Zithromax.  Patient was then admitted hospital for further evaluation and treatment.    Assessment and Plan: Active Problems:   Sepsis (HCC)   CAP (community acquired pneumonia)   Hypothyroidism   Achalasia   Hypokalemia   Down's syndrome   Debility   Malnourished (HCC)   Dehydration   Constipation   Malnutrition of moderate degree   Sepsis secondary to community-acquired pneumonia Patient met the sepsis criteria with leukocytosis tachycardia and tachypnea with pneumonia on the chest x-ray and CT scan.  MRSA PCR negative.  Procalcitonin was positive with gradual worsening to 8.9 from 0.8 yesterday.  We will repeat procalcitonin again tomorrow.  WBC elevated at 13.7.  We will continue current antibiotics with cefepime and Zithromax.  Still on supplemental oxygen will wean as able.  Blood cultures negative in 2 days.  Strep pneumo urinary antigen negative.  Hypothyroidism TSH low.  Synthroid dose has been decreased to 112 from 200 mcg.  TSH needs to be rechecked in 4 to 6 weeks.    Achalasia S/p repair at [facility].  Hypokalemia Resolved.  Latest potassium of 3.7.  Down's syndrome Continue supportive care.  Debility PT OT has been consulted.   Has been experiencing worsening weakness.  Moderate protein calorie malnutrition.  Present on admission. Dietitian on board.  Continue nutritional supplements   DVT prophylaxis.  Lovenox  Subjective:   Today, patient was seen and examined at bedside.  Unable to verbalize,  hard of hearing.  Communicated with the patient's mother at bedside who states that patient is not at his baseline yet.  Physical Exam: Vitals:   05/19/22 2328 05/20/22 0330 05/20/22 0730 05/20/22 1214  BP: 91/64 98/62 103/63 93/61  Pulse: 89 99 93 67  Resp: (!) 22 (!) 29 (!) 28   Temp: 98.1 F (36.7 C) 98.6 F (37 C) 98.1 F (36.7 C)   TempSrc: Oral Oral Oral   SpO2: 94% 91% 95% 100%  Weight:      Height:       General: Malnourished, not in obvious distress alert awake, hard of hearing, on nasal cannula oxygen, HENT:   No scleral pallor or icterus noted. Oral mucosa is moist.  Chest:    Diminished breath sounds bilaterally.   CVS: S1 &S2 heard. No murmur.  Regular rate and rhythm. Abdomen: Soft, nontender, nondistended.  Bowel sounds are heard.   Extremities: No cyanosis, clubbing or edema.  Peripheral pulses are palpable. Psych: Alert, awake, hard of hearing, minimally verbal, CNS: Moves extremities. Skin: Warm and dry.  No rashes noted.   Data Reviewed: Laboratory data and imaging reviewed in the last 24 hours    Family Communication:  Spoke with the patient's mother on the phone and updated her about the clinical condition of the patient.  Disposition: Home  Status is: Inpatient  Remains inpatient appropriate because: Severity of illness, IV antibiotic, supplemental oxygen, rising procalcitonin   Planned Discharge Destination: Home   Author: Joycelyn Das, MD 05/20/2022 2:20 PM  For on call review [email or web address].

## 2022-05-21 DIAGNOSIS — R5381 Other malaise: Secondary | ICD-10-CM | POA: Diagnosis not present

## 2022-05-21 DIAGNOSIS — K220 Achalasia of cardia: Secondary | ICD-10-CM | POA: Diagnosis not present

## 2022-05-21 DIAGNOSIS — J189 Pneumonia, unspecified organism: Secondary | ICD-10-CM | POA: Diagnosis not present

## 2022-05-21 DIAGNOSIS — K5900 Constipation, unspecified: Secondary | ICD-10-CM | POA: Diagnosis not present

## 2022-05-21 LAB — CBC
HCT: 33.3 % — ABNORMAL LOW (ref 39.0–52.0)
Hemoglobin: 11.2 g/dL — ABNORMAL LOW (ref 13.0–17.0)
MCH: 30.9 pg (ref 26.0–34.0)
MCHC: 33.6 g/dL (ref 30.0–36.0)
MCV: 91.7 fL (ref 80.0–100.0)
Platelets: 404 10*3/uL — ABNORMAL HIGH (ref 150–400)
RBC: 3.63 MIL/uL — ABNORMAL LOW (ref 4.22–5.81)
RDW: 13.9 % (ref 11.5–15.5)
WBC: 11 10*3/uL — ABNORMAL HIGH (ref 4.0–10.5)
nRBC: 0 % (ref 0.0–0.2)

## 2022-05-21 LAB — BASIC METABOLIC PANEL
Anion gap: 5 (ref 5–15)
BUN: 11 mg/dL (ref 6–20)
CO2: 25 mmol/L (ref 22–32)
Calcium: 7.7 mg/dL — ABNORMAL LOW (ref 8.9–10.3)
Chloride: 107 mmol/L (ref 98–111)
Creatinine, Ser: 0.8 mg/dL (ref 0.61–1.24)
GFR, Estimated: >60 mL/min (ref >60)
Glucose, Bld: 101 mg/dL — ABNORMAL HIGH (ref 70–99)
Potassium: 3.7 mmol/L (ref 3.5–5.1)
Sodium: 137 mmol/L (ref 135–145)

## 2022-05-21 LAB — PROCALCITONIN: Procalcitonin: 5.53 ng/mL

## 2022-05-21 LAB — MAGNESIUM: Magnesium: 2.4 mg/dL (ref 1.7–2.4)

## 2022-05-21 NOTE — Progress Notes (Signed)
Physical Therapy Treatment Patient Details Name: Ronald Le MRN: [id number] DOB: 02/04/1991 Today's Date: 05/21/2022   History of Present Illness Ronald Le is a 31 y.o. male admitted 6/25 with recurrent pneumonia here for evaluation of weakness and cough.  PMH:  Downs syndrome, MR, hypothyroidism, esophageal diverticulum    PT Comments    Pt displayed some mild instability upon coming to stand and initially with gait today, reporting dizziness, but VSS on RA, see BP measures in General Comments. As distance progressed, his stability improved, ambulating with only min guard assist even with a few dynamic gait challenges. Will continue to follow acutely.     Recommendations for follow up therapy are one component of a multi-disciplinary discharge planning process, led by the attending physician.  Recommendations may be updated based on patient status, additional functional criteria and insurance authorization.  Follow Up Recommendations  No PT follow up     Assistance Recommended at Discharge PRN  Patient can return home with the following A little help with walking and/or transfers;Help with stairs or ramp for entrance   Equipment Recommendations  None recommended by PT    Recommendations for Other Services       Precautions / Restrictions Precautions Precautions: Fall Restrictions Weight Bearing Restrictions: No     Mobility  Bed Mobility Overal bed mobility: Modified Independent             General bed mobility comments: Able to come to sit EOB with HOB elevated without assistance.    Transfers Overall transfer level: Needs assistance Equipment used: None Transfers: Sit to/from Stand Sit to Stand: Min guard           General transfer comment: Pt mild instability coming to stand 1x from EOB and 1x from swivel chair, min guard for safety    Ambulation/Gait Ambulation/Gait assistance: Min guard Gait Distance (Feet): 500 Feet (x2 bouts  of ~60 ft > ~500 ft) Assistive device: None Gait Pattern/deviations: Step-through pattern, Decreased stride length, Staggering left, Staggering right Gait velocity: reduced Gait velocity interpretation: 1.31 - 2.62 ft/sec, indicative of limited community ambulator   General Gait Details: Pt initially was mildly unstable with slight staggering. Pt reported feeling dizzy, thus was cued to sit for safety. BP reading 100/72 sitting then 104/79 standing. Pt's stability improved as distance progressed, with no LOB with pt changing speeds or head directions. Min guard for safety.   Stairs             Wheelchair Mobility    Modified Rankin (Stroke Patients Only)       Balance Overall balance assessment: Needs assistance Sitting-balance support: No upper extremity supported, Feet supported Sitting balance-Leahy Scale: Fair     Standing balance support: No upper extremity supported, During functional activity Standing balance-Leahy Scale: Fair Standing balance comment: can ambulate without UE support, mild instability noted                            Cognition Arousal/Alertness: Awake/alert Behavior During Therapy: WFL for tasks assessed/performed Overall Cognitive Status: History of cognitive impairments - at baseline                                 General Comments: MR per chart.  Pt follows 1 and 2 step commands        Exercises      General Comments General comments (  skin integrity, edema, etc.): BP 100/72 sitting after first gait bout, 104/79 standing, 115/73 sitting end of session      Pertinent Vitals/Pain Pain Assessment Pain Assessment: Faces Faces Pain Scale: No hurt Pain Intervention(s): Monitored during session    Home Living                          Prior Function            PT Goals (current goals can now be found in the care plan section) Acute Rehab PT Goals Patient Stated Goal: to go outside PT Goal  Formulation: With patient Time For Goal Achievement: 06/02/22 Potential to Achieve Goals: Good Progress towards PT goals: Progressing toward goals    Frequency    Min 3X/week      PT Plan Current plan remains appropriate    Co-evaluation              AM-PAC PT "6 Clicks" Mobility   Outcome Measure  Help needed turning from your back to your side while in a flat bed without using bedrails?: None Help needed moving from lying on your back to sitting on the side of a flat bed without using bedrails?: None Help needed moving to and from a bed to a chair (including a wheelchair)?: A Little Help needed standing up from a chair using your arms (e.g., wheelchair or bedside chair)?: A Little Help needed to walk in hospital room?: A Little Help needed climbing 3-5 steps with a railing? : A Little 6 Click Score: 20    End of Session   Activity Tolerance: Patient tolerated treatment well Patient left: with call bell/phone within reach;in chair;with chair alarm set Nurse Communication: Mobility status;Other (comment) (BP, dizziness) PT Visit Diagnosis: Muscle weakness (generalized) (M62.81);Unsteadiness on feet (R26.81);Other abnormalities of gait and mobility (R26.89);Difficulty in walking, not elsewhere classified (R26.2)     Time: 0017-4944 PT Time Calculation (min) (ACUTE ONLY): 32 min  Charges:  $Gait Training: 23-37 mins                     Raymond Gurney, PT, DPT Acute Rehabilitation Services  Office: [phone]    Jewel Baize 05/21/2022, 9:30 AM

## 2022-05-21 NOTE — Progress Notes (Addendum)
Progress Note   Patient: Ronald Le:[id number] DOB: Apr 29, 1991 DOA: 05/18/2022     3   Brief hospital course: Ronald Le is a 31 y.o. male with past medical history of Down syndrome, hypothyroidism, recurrent pneumonia presented to the hospital with cough lethargy shortness of breath and productive sputum.  Patient has history of hard of hearing but has refused hearing aids and has not been verbal since he had his surgery for achalasia.  In the ED, patient was tachycardic, tachypneic with leukocytosis.  Chest x-ray showed left upper lobe and lingula consolidation. CT angiogram chest was negative for PE but extensive left upper and lingular lobe pneumonia with moderate left-sided effusion.  Patient was started on Rocephin and Zithromax.  Patient was then admitted hospital for further evaluation and treatment.    Assessment and Plan: Active Problems:   Sepsis (HCC)   CAP (community acquired pneumonia)   Hypothyroidism   Achalasia   Hypokalemia   Down's syndrome   Debility   Malnourished (HCC)   Dehydration   Constipation   Malnutrition of moderate degree   Sepsis secondary to community-acquired pneumonia  MRSA PCR negative.  Procalcitonin was positive with gradual worsening to 8.9 from 0.8.  Procalcitonin has trended down to 5.5 today.  Has been weaned off oxygen.  WBC trending down to 11.0 from 13.7.  Continue cefepime and Zithromax.  Will continue with the current regimen of antibiotic.  Blood cultures negative in 3 days.  Strep pneumo urinary antigen and Legionella urinary negative.  Initially patient needed oxygen for hypoxia.  Acute respiratory failure has been ruled out.  Hypothyroidism TSH low.  Synthroid dose has been decreased to 112 from 200 mcg.  TSH needs to be rechecked in 4 to 6 weeks.    Achalasia S/p repair at [facility].  Hypokalemia Resolved.  Latest potassium of 3.7.  Down's syndrome Continue supportive care.  Debility PT OT has been consulted.   Has been experiencing worsening weakness.  As per PT no increasing needs.  Moderate protein calorie malnutrition.  Present on admission. Dietitian on board.  Continue nutritional supplements   DVT prophylaxis.  Lovenox  Subjective:   Today, patient was seen and examined at bedside.  Appears to be more alert awake.  Minimally verbal.  Hard of hearing.  Has been weaned off oxygen.    Physical Exam: Vitals:   05/20/22 2000 05/20/22 2350 05/21/22 0301 05/21/22 0747  BP: 95/63 92/63 91/67  92/69  Pulse: 73 85 85 89  Resp: (!) 23 (!) 26 (!) 25 20  Temp: (!) 97.5 F (36.4 C) 98.2 F (36.8 C) 98.6 F (37 C) (!) 97.4 F (36.3 C)  TempSrc: Oral Oral Oral Oral  SpO2: 100% 93% 94% 94%  Weight:      Height:      Body mass index is 19.06 kg/m.  General: Thinly built, not in obvious distress, hard of hearing, minimally verbal HENT:   No scleral pallor or icterus noted. Oral mucosa is moist.  Chest:    Diminished breath sounds bilaterally.  CVS: S1 &S2 heard. No murmur.  Regular rate and rhythm. Abdomen: Soft, nontender, nondistended.  Bowel sounds are heard.   Extremities: No cyanosis, clubbing or edema.  Peripheral pulses are palpable. Psych: Alert, awake and minimally verbal, CNS: Moves all extremities. Skin: Warm and dry.  No rashes noted.   Data Reviewed: Laboratory data and imaging reviewed in the last 24 hours    Family Communication:  Spoke with the patient's mother on  05/20/2022  Disposition: Home likely 05/22/2022  Status is: Inpatient  Remains inpatient appropriate because: Severity of illness, IV antibiotic, elevated procalcitonin   Planned Discharge Destination: Home   Author: Joycelyn Das, MD 05/21/2022 8:37 AM  For on call review [email or web address].

## 2022-05-21 NOTE — Care Management Important Message (Signed)
Important Message  Patient Details  Name: Ronald Le MRN: [id number] Date of Birth: [birth year]   Medicare Important Message Given:  Yes     Brigg Cape Stefan Church 05/21/2022, 2:47 PM

## 2022-05-22 DIAGNOSIS — K5900 Constipation, unspecified: Secondary | ICD-10-CM | POA: Diagnosis not present

## 2022-05-22 DIAGNOSIS — J189 Pneumonia, unspecified organism: Secondary | ICD-10-CM | POA: Diagnosis not present

## 2022-05-22 DIAGNOSIS — R5381 Other malaise: Secondary | ICD-10-CM | POA: Diagnosis not present

## 2022-05-22 DIAGNOSIS — K220 Achalasia of cardia: Secondary | ICD-10-CM | POA: Diagnosis not present

## 2022-05-22 LAB — CBC
HCT: 35.4 % — ABNORMAL LOW (ref 39.0–52.0)
Hemoglobin: 11.7 g/dL — ABNORMAL LOW (ref 13.0–17.0)
MCH: 29.9 pg (ref 26.0–34.0)
MCHC: 33.1 g/dL (ref 30.0–36.0)
MCV: 90.5 fL (ref 80.0–100.0)
Platelets: 472 10*3/uL — ABNORMAL HIGH (ref 150–400)
RBC: 3.91 MIL/uL — ABNORMAL LOW (ref 4.22–5.81)
RDW: 14 % (ref 11.5–15.5)
WBC: 10.9 10*3/uL — ABNORMAL HIGH (ref 4.0–10.5)
nRBC: 0 % (ref 0.0–0.2)

## 2022-05-22 LAB — BASIC METABOLIC PANEL
Anion gap: 11 (ref 5–15)
BUN: 8 mg/dL (ref 6–20)
CO2: 25 mmol/L (ref 22–32)
Calcium: 8.1 mg/dL — ABNORMAL LOW (ref 8.9–10.3)
Chloride: 104 mmol/L (ref 98–111)
Creatinine, Ser: 0.75 mg/dL (ref 0.61–1.24)
GFR, Estimated: >60 mL/min (ref >60)
Glucose, Bld: 120 mg/dL — ABNORMAL HIGH (ref 70–99)
Potassium: 3.6 mmol/L (ref 3.5–5.1)
Sodium: 140 mmol/L (ref 135–145)

## 2022-05-22 LAB — MAGNESIUM: Magnesium: 2.4 mg/dL (ref 1.7–2.4)

## 2022-05-22 LAB — PROCALCITONIN: Procalcitonin: 2.24 ng/mL

## 2022-05-22 MED ORDER — GUAIFENESIN ER 600 MG PO TB12: 600.0000 mg | ORAL_TABLET | Freq: Two times a day (BID) | ORAL | 0 refills | Status: AC

## 2022-05-22 MED ORDER — CEFDINIR 300 MG PO CAPS
300.0000 mg | ORAL_CAPSULE | Freq: Two times a day (BID) | ORAL | 0 refills | Status: AC
Start: 2022-05-22 — End: 2022-05-25

## 2022-05-22 MED ORDER — ADULT MULTIVITAMIN W/MINERALS CH: 1.0000 | ORAL_TABLET | Freq: Every day | ORAL | Status: AC

## 2022-05-22 MED ORDER — AZITHROMYCIN 500 MG PO TABS
500.0000 mg | ORAL_TABLET | Freq: Every day | ORAL | 0 refills | Status: AC
Start: 2022-05-22 — End: 2022-05-25

## 2022-05-22 NOTE — Progress Notes (Signed)
Physical Therapy Treatment Patient Details Name: Ronald Le MRN: [id number] DOB: 02/11/1991 Today's Date: 05/22/2022   History of Present Illness Ronald Le is a 31 y.o. male admitted 6/25 with recurrent pneumonia here for evaluation of weakness and cough.  PMH:  Downs syndrome, MR, hypothyroidism, esophageal diverticulum    PT Comments    Pt is demonstrating improved stability with coming to stand and ambulating today. Pt also able to change standing positions to shift weight off COG and challenge his balance without LOB, UE support, or need for assistance. Pt with x1 stagger, in which he recovered without assistance, when coughing while ambulating. Educated mother to supervise pt as needed upon d/c. Will continue to follow acutely.     Recommendations for follow up therapy are one component of a multi-disciplinary discharge planning process, led by the attending physician.  Recommendations may be updated based on patient status, additional functional criteria and insurance authorization.  Follow Up Recommendations  No PT follow up     Assistance Recommended at Discharge PRN  Patient can return home with the following Help with stairs or ramp for entrance   Equipment Recommendations  None recommended by PT    Recommendations for Other Services       Precautions / Restrictions Precautions Precautions: Fall Restrictions Weight Bearing Restrictions: No     Mobility  Bed Mobility               General bed mobility comments: Pt OOB in chair upon arrival.    Transfers Overall transfer level: Needs assistance Equipment used: None Transfers: Sit to/from Stand Sit to Stand: Supervision           General transfer comment: Supervision for safety, pt needing a moment to pause upon coming to stand prior to ambulating to adjust.    Ambulation/Gait Ambulation/Gait assistance: Min guard, Supervision Gait Distance (Feet): 535 Feet Assistive device:  None Gait Pattern/deviations: Step-through pattern, Decreased stride length, Staggering right, Drifts right/left Gait velocity: reduced Gait velocity interpretation: 1.31 - 2.62 ft/sec, indicative of limited community ambulator   General Gait Details: Pt was much more stable today, changing head positions, speeds, and navigating obstacles without LOB, mild drifting though. Pt did stagger one time during a coughing bout, but recovered with step reactional strategies. Min guard-supervision for safety.   Stairs             Wheelchair Mobility    Modified Rankin (Stroke Patients Only)       Balance Overall balance assessment: Needs assistance Sitting-balance support: No upper extremity supported, Feet supported Sitting balance-Leahy Scale: Fair     Standing balance support: No upper extremity supported, During functional activity Standing balance-Leahy Scale: Good Standing balance comment: can ambulate without UE support and stand off COG without LOB, mild stagger when coughing but recovered without assistance                            Cognition Arousal/Alertness: Awake/alert Behavior During Therapy: WFL for tasks assessed/performed Overall Cognitive Status: History of cognitive impairments - at baseline                                 General Comments: MR per chart.  Pt follows 1 and 2 step commands. Distracted at times        Exercises      General Comments General comments (skin integrity, edema,  etc.): no redness noted at abdomen/groin, but notified RN of pt's complaint of pain there and potentially distended abdomen and redness at R forearm      Pertinent Vitals/Pain Pain Assessment Pain Assessment: Faces Faces Pain Scale: Hurts a little bit Pain Intervention(s): Limited activity within patient's tolerance, Monitored during session, Other (comment) (notified RN)    Home Living                          Prior Function             PT Goals (current goals can now be found in the care plan section) Acute Rehab PT Goals Patient Stated Goal: indicated excitement in idea of going home potentially today PT Goal Formulation: With patient Time For Goal Achievement: 06/02/22 Potential to Achieve Goals: Good Progress towards PT goals: Progressing toward goals    Frequency    Min 3X/week      PT Plan Current plan remains appropriate    Co-evaluation              AM-PAC PT "6 Clicks" Mobility   Outcome Measure  Help needed turning from your back to your side while in a flat bed without using bedrails?: None Help needed moving from lying on your back to sitting on the side of a flat bed without using bedrails?: None Help needed moving to and from a bed to a chair (including a wheelchair)?: A Little Help needed standing up from a chair using your arms (e.g., wheelchair or bedside chair)?: A Little Help needed to walk in hospital room?: A Little Help needed climbing 3-5 steps with a railing? : A Little 6 Click Score: 20    End of Session   Activity Tolerance: Patient tolerated treatment well Patient left: with call bell/phone within reach;in chair;with chair alarm set;with family/visitor present Nurse Communication: Mobility status;Other (comment) (redness/pain) PT Visit Diagnosis: Muscle weakness (generalized) (M62.81);Unsteadiness on feet (R26.81);Other abnormalities of gait and mobility (R26.89);Difficulty in walking, not elsewhere classified (R26.2)     Time: 3267-1245 PT Time Calculation (min) (ACUTE ONLY): 23 min  Charges:  $Gait Training: 23-37 mins                     Raymond Gurney, PT, DPT Acute Rehabilitation Services  Office: [phone]    Jewel Baize 05/22/2022, 10:50 AM

## 2022-05-22 NOTE — Plan of Care (Signed)
Patient is being discharged home with mom today

## 2022-05-22 NOTE — Care Management (Signed)
PCP follow up appointment made, [facility] on AVS, discussed with RN. Patient to DC

## 2022-05-22 NOTE — Discharge Summary (Signed)
Physician Discharge Summary   Patient: Ronald Le MRN: [id number] DOB: 01-06-1991  Admit date:     05/18/2022  Discharge date: 05/22/22  Discharge Physician: Rebekah [place] Jakarie Pember   PCP: Pcp, No   Recommendations at discharge:    Follow up with your primary care provider in one week.  Check CBC, BMP, Mg, in the next visit. Patient will benefit from follow-up chest x-ray in 4 to 6 weeks for ensuring resolution of pneumonia Synthroid dose was adjusted during hospitalization.  Will need follow up with TSH in 4 to 6 weeks.  Discharge Diagnoses: Active Problems:   Sepsis (HCC)   CAP (community acquired pneumonia)   Hypothyroidism   Achalasia   Hypokalemia   Down's syndrome   Debility   Malnourished (HCC)   Dehydration   Constipation   Malnutrition of moderate degree  Resolved Problems:   * No resolved hospital problems. *  Hospital Course: Ronald Le is a 31 y.o. male with past medical history of Down syndrome, hypothyroidism, recurrent pneumonia presented to the hospital with cough, lethargy ,shortness of breath and productive sputum.  Patient has history of hard of hearing but has refused hearing aids and has not been verbal since he had his surgery for achalasia.  In the ED, patient was tachycardic, tachypneic with leukocytosis.  Chest x-ray showed left upper lobe and lingula consolidation. CT angiogram chest was negative for PE but extensive left upper and lingular lobe pneumonia with moderate left-sided effusion.  Patient was started on Rocephin and Zithromax and was then admitted to the hospital for further evaluation and treatment.    Following conditions were addressed during hospitalization,  Sepsis secondary to community-acquired pneumonia  MRSA PCR was negative.  Procalcitonin was positive with gradually trended down to 2.2 prior to discharge.  Patient was initially on supplemental oxygen which has been discontinued..  Leukocytosis has trended down.  Patient  is afebrile.  At this time patient appears to be alert awake and at baseline.  Blood cultures have been negative so far in 4 days..  Strep pneumo urinary antigen and Legionella urinary negative.   Patient has responded with cefepime and Zithromax.  Will change to [facility] and Zithromax to complete 5-day course on discharge.  Recommend follow-up chest x-ray in 6 to 8 weeks with her primary care provider.  Hypothyroidism TSH low.  Synthroid dose has been decreased to 112 from 200 mcg.  TSH needs to be rechecked in 4 to 6 weeks.     Achalasia S/p repair at [facility].  Nonverbal since then.   Hypokalemia Resolved.  Latest potassium of 3.6.   Down's syndrome Continue supportive care.   Debility PT OT recommended no additional needs.     Moderate protein calorie malnutrition.  Present on admission.  Received nutritional supplements during hospitalization  Consultants: None  Procedures performed: None  Disposition: Home.  Communicated with the patient's mother on the phone prior to disposition.  Diet recommendation:  Discharge Diet Orders (From admission, onward)     Start     Ordered   05/22/22 0000  Diet general        05/22/22 0819           Regular diet DISCHARGE MEDICATION: Allergies as of 05/22/2022   No Known Allergies      Medication List     STOP taking these medications    ergocalciferol 1.25 MG (50000 UT) capsule Commonly known as: VITAMIN D2   Pepto-Bismol 262 MG/15ML suspension Generic drug: bismuth subsalicylate  TAKE these medications    acetaminophen 650 MG CR tablet Commonly known as: TYLENOL Take 1,300 mg by mouth every 8 (eight) hours as needed for pain.   ALLEGRA PO Take 1 tablet by mouth daily as needed (allergy).   azithromycin 500 MG tablet Commonly known as: Zithromax Take 1 tablet (500 mg total) by mouth daily for 3 days. Take 1 tablet daily for 3 days.   cefdinir 300 MG capsule Commonly known as: OMNICEF Take 1 capsule (300 mg  total) by mouth 2 (two) times daily for 3 days.   guaiFENesin 600 MG 12 hr tablet Commonly known as: MUCINEX Take 1 tablet (600 mg total) by mouth 2 (two) times daily for 10 days.   ibuprofen 200 MG tablet Commonly known as: ADVIL Take 600 mg by mouth every 6 (six) hours as needed for moderate pain.   levothyroxine 112 MCG tablet Commonly known as: SYNTHROID Take 112 mcg by mouth See admin instructions. Takes 1 tablet daily Monday through friday.   multivitamin with minerals Tabs tablet Take 1 tablet by mouth daily.   omeprazole 40 MG capsule Commonly known as: PRILOSEC Take 40 mg by mouth daily.        Follow-up Information     Primary care provider Follow up in 1 week(s).                 Subjective. Today, patient was seen and examined at bedside.  Nonverbal.  Appears stable and comfortable.  No interval complaints reported.  Discharge Exam: Filed Weights   05/18/22 2017  Weight: 48.8 kg      05/22/2022    7:48 AM 05/22/2022    3:02 AM 05/21/2022   11:39 PM  Vitals with BMI  Systolic 99 102 103  Diastolic 60 69 75  Pulse 80 84 89    General: Thinly built, not in obvious distress hard of hearing, mostly nonverbal HENT:   No scleral pallor or icterus noted. Oral mucosa is moist.  Chest:  Diminished breath sounds bilaterally.  CVS: S1 &S2 heard. No murmur.  Regular rate and rhythm. Abdomen: Soft, nontender, nondistended.  Bowel sounds are heard.   Extremities: No cyanosis, clubbing or edema.  Peripheral pulses are palpable. Psych: Alert, awake nonverbal, CNS: Moves extremities. Skin: Warm and dry.  No rashes noted.   Condition at discharge: good  The results of significant diagnostics from this hospitalization (including imaging, microbiology, ancillary and laboratory) are listed below for reference.   Imaging Studies: DG Abd 1 View  Result Date: 05/18/2022 CLINICAL DATA:  Constipation. EXAM: ABDOMEN - 1 VIEW COMPARISON:  05/18/2022. FINDINGS: The  bowel gas pattern is normal. No significant stool burden. Excreted contrast is present in the urinary bladder. Patchy airspace disease is present at the lung bases. No radio-opaque calculi or other significant radiographic abnormality are seen. IMPRESSION: 1. No evidence of bowel obstruction or significant stool burden. 2. Patchy airspace disease at the lung bases, suspicious for pneumonia. Electronically Signed   By: Thornell Sartorius M.D.   On: 05/18/2022 20:40   CT Angio Chest PE W and/or Wo Contrast  Result Date: 05/18/2022 CLINICAL DATA:  Cough and chest pain. EXAM: CT ANGIOGRAPHY CHEST WITH CONTRAST TECHNIQUE: Multidetector CT imaging of the chest was performed using the standard protocol during bolus administration of intravenous contrast. Multiplanar CT image reconstructions and MIPs were obtained to evaluate the vascular anatomy. RADIATION DOSE REDUCTION: This exam was performed according to the departmental dose-optimization program which includes automated exposure control, adjustment  of the mA and/or kV according to patient size and/or use of iterative reconstruction technique. CONTRAST:  60mL OMNIPAQUE IOHEXOL 350 MG/ML SOLN COMPARISON:  Chest x-ray 05/18/2022 FINDINGS: Cardiovascular: The heart is normal in size. No pericardial effusion. The aorta is normal in caliber. No dissection. The branch vessels are patent. The pulmonary arterial tree is well opacified. No filling defects to suggest pulmonary embolism. Mediastinum/Nodes: Scattered mediastinal and hilar lymph nodes likely reactive. The esophagus is grossly normal. Lungs/Pleura: Dense airspace consolidation in the left upper lobe and lingula suggesting poly lobar pneumonia. There is also a E small to moderate-sized left pleural effusion and patchy E ground-glass opacity in the remainder of the left upper lobe and also to a lesser extent in the left lower lobe. The right lung is clear. Upper Abdomen: No significant upper abdominal findings. There  are numerous small gallstones noted in the gallbladder. Musculoskeletal: No chest wall mass, supraclavicular or axillary adenopathy. The bony thorax is intact. Review of the MIP images confirms the above findings. IMPRESSION: 1. No CT findings for pulmonary embolism. 2. Normal thoracic aorta. 3. Extensive left upper lobe and left lingular pneumonia. 4. Small to moderate-sized left pleural effusion. 5. Cholelithiasis. Electronically Signed   By: Rudie Meyer M.D.   On: 05/18/2022 17:53   DG Chest 2 View  Result Date: 05/18/2022 CLINICAL DATA:  Cough. EXAM: CHEST - 2 VIEW COMPARISON:  06/09/2013 FINDINGS: Stable cardiomediastinal contours. A trace left pleural effusion is present. Airspace consolidation within the anterior basal left upper lobe and lingula identified concerning for pneumonia. Right lung appears clear. Visualized osseous structures are unremarkable. IMPRESSION: 1. Left upper lobe and lingular pneumonia. 2. Trace left pleural effusion. Electronically Signed   By: Signa Kell M.D.   On: 05/18/2022 15:40    Microbiology: Results for orders placed or performed during the hospital encounter of 05/18/22  Blood culture (routine x 2)     Status: None (Preliminary result)   Collection Time: 05/18/22  4:05 PM   Specimen: BLOOD  Result Value Ref Range Status   Specimen Description BLOOD RIGHT ANTECUBITAL  Final   Special Requests   Final    BOTTLES DRAWN AEROBIC AND ANAEROBIC Blood Culture results may not be optimal due to an excessive volume of blood received in culture bottles   Culture   Final    NO GROWTH 4 DAYS Performed at [facility], [address] [place], [place] [address]    Report Status PENDING  Incomplete  SARS Coronavirus 2 by RT PCR (hospital order, performed in [facility] hospital lab) *cepheid single result test* Anterior Nasal Swab     Status: None   Collection Time: 05/18/22  4:07 PM   Specimen: Anterior Nasal Swab  Result Value Ref Range Status   SARS  Coronavirus 2 by RT PCR NEGATIVE NEGATIVE Final    Comment: (NOTE) SARS-CoV-2 target nucleic acids are NOT DETECTED.  The SARS-CoV-2 RNA is generally detectable in upper and lower respiratory specimens during the acute phase of infection. The lowest concentration of SARS-CoV-2 viral copies this assay can detect is 250 copies / mL. A negative result does not preclude SARS-CoV-2 infection and should not be used as the sole basis for treatment or other patient management decisions.  A negative result may occur with improper specimen collection / handling, submission of specimen other than nasopharyngeal swab, presence of viral mutation(s) within the areas targeted by this assay, and inadequate number of viral copies (<250 copies / mL). A negative  result must be combined with clinical observations, patient history, and epidemiological information.  Fact Sheet for Patients:   RoadLapTop.co.za  Fact Sheet for Healthcare Providers: http://kim-miller.com/  This test is not yet approved or  cleared by the [place] FDA and has been authorized for detection and/or diagnosis of SARS-CoV-2 by FDA under an Emergency Use Authorization (EUA).  This EUA will remain in effect (meaning this test can be used) for the duration of the COVID-19 declaration under Section 564(b)(1) of the Act, 21 U.S.C. section 360bbb-3(b)(1), unless the authorization is terminated or revoked sooner.  Performed at [facility], [address] [place], [place] [address]   Blood culture (routine x 2)     Status: None (Preliminary result)   Collection Time: 05/18/22  4:10 PM   Specimen: BLOOD RIGHT FOREARM  Result Value Ref Range Status   Specimen Description BLOOD RIGHT FOREARM  Final   Special Requests   Final    BOTTLES DRAWN AEROBIC AND ANAEROBIC Blood Culture results may not be optimal due to an excessive volume of blood received in culture bottles   Culture    Final    NO GROWTH 4 DAYS Performed at [facility], [address] [place], [place] [address]    Report Status PENDING  Incomplete  MRSA Next Gen by PCR, Nasal     Status: None   Collection Time: 05/19/22  6:45 AM  Result Value Ref Range Status   MRSA by PCR Next Gen NOT DETECTED NOT DETECTED Final    Comment: (NOTE) The GeneXpert MRSA Assay (FDA approved for NASAL specimens only), is one component of a comprehensive MRSA colonization surveillance program. It is not intended to diagnose MRSA infection nor to guide or monitor treatment for MRSA infections. Test performance is not FDA approved in patients less than 30 years old. Performed at [facility], [address] [place], [place] [address]   Urine Culture     Status: None   Collection Time: 05/19/22 11:52 AM   Specimen: Urine, Clean Catch  Result Value Ref Range Status   Specimen Description URINE, CLEAN CATCH  Final   Special Requests NONE  Final   Culture   Final    NO GROWTH Performed at [facility], [address] [place], [place] [address]    Report Status 05/20/2022 FINAL  Final    Labs: CBC: Recent Labs  Lab 05/18/22 1519 05/19/22 0346 05/21/22 0047 05/22/22 0612  WBC 12.8* 13.7* 11.0* 10.9*  NEUTROABS 10.6*  --   --   --   HGB 13.8 11.7* 11.2* 11.7*  HCT 40.9 33.6* 33.3* 35.4*  MCV 90.7 90.1 91.7 90.5  PLT 405* 337 404* 472*   Basic Metabolic Panel: Recent Labs  Lab 05/18/22 1519 05/18/22 2105 05/19/22 0346 05/21/22 0047 05/22/22 0612  NA 135  --  134* 137 140  K 3.3*  --  3.7 3.7 3.6  CL 94*  --  101 107 104  CO2 29  --  26 25 25   GLUCOSE 132*  --  113* 101* 120*  BUN 9  --  7 11 8   CREATININE 1.03  --  0.85 0.80 0.75  CALCIUM 8.7*  --  7.9* 7.7* 8.1*  MG  --  2.0  --  2.4 2.4  PHOS  --  3.0  --   --   --    Liver Function Tests: Recent Labs  Lab 05/18/22 1519 05/19/22 0346  AST  25 20  ALT 25 23  ALKPHOS 64 49  BILITOT 0.7 0.9  PROT 7.6 5.9*  ALBUMIN 2.7*  2.1*   CBG: No results for input(s): "GLUCAP" in the last 168 hours.  Discharge time spent: greater than 30 minutes.  Signed: Joycelyn Das, MD [facility] 05/22/2022

## 2022-05-23 LAB — CULTURE, BLOOD (ROUTINE X 2)
Culture: NO GROWTH
Culture: NO GROWTH

## 2022-06-11 ENCOUNTER — Inpatient Hospital Stay (INDEPENDENT_AMBULATORY_CARE_PROVIDER_SITE_OTHER): Payer: Medicare Other | Admitting: Primary Care

## 2022-07-30 ENCOUNTER — Ambulatory Visit (INDEPENDENT_AMBULATORY_CARE_PROVIDER_SITE_OTHER): Payer: Medicare Other | Admitting: Podiatry

## 2022-07-30 ENCOUNTER — Encounter: Payer: Self-pay | Admitting: Podiatry

## 2022-07-30 ENCOUNTER — Ambulatory Visit: Payer: Medicare Other | Admitting: Podiatry

## 2022-07-30 DIAGNOSIS — M79675 Pain in left toe(s): Secondary | ICD-10-CM | POA: Diagnosis not present

## 2022-07-30 DIAGNOSIS — B351 Tinea unguium: Secondary | ICD-10-CM

## 2022-07-30 DIAGNOSIS — M79674 Pain in right toe(s): Secondary | ICD-10-CM

## 2022-07-30 DIAGNOSIS — E039 Hypothyroidism, unspecified: Secondary | ICD-10-CM | POA: Diagnosis not present

## 2022-07-30 DIAGNOSIS — Q909 Down syndrome, unspecified: Secondary | ICD-10-CM

## 2022-07-30 NOTE — Progress Notes (Signed)
Complaint:  Visit Type: Patient returns to my office for continued preventative foot care services. Complaint: Patient states" my nails have grown long and thick and become painful to walk and wear shoes"  The patient presents for preventative foot care services. No changes to ROS. Patient has Downs syndrome and is accompanied by his mother.  Podiatric Exam: Vascular: dorsalis pedis and posterior tibial pulses are palpable bilateral. Capillary return is immediate. Temperature gradient is WNL. Skin turgor WNL  Sensorium: Normal Semmes Weinstein monofilament test. Normal tactile sensation bilaterally. Nail Exam: Pt has thick disfigured discolored nails with subungual debris noted bilateral entire nail hallux through fifth toenails Ulcer Exam: There is no evidence of ulcer or pre-ulcerative changes or infection. Orthopedic Exam: Muscle tone and strength are WNL. No limitations in general ROM. No crepitus or effusions noted. Foot type and digits show no abnormalities. HAV  B/L. Skin: No Porokeratosis. No infection or ulcers  Diagnosis:  Onychomycosis, , Pain in right toe, pain in left toes  Treatment & Plan Procedures and Treatment: Consent by patient was obtained for treatment procedures.   Debridement of mycotic and hypertrophic toenails, 1 through 5 bilateral and clearing of subungual debris. No ulceration, no infection noted.  Return Visit-Office Procedure: Patient instructed to return to the office for a follow up visit 12  weeks  for continued evaluation and treatment.    Helane Gunther DPM

## 2022-10-08 DIAGNOSIS — E559 Vitamin D deficiency, unspecified: Secondary | ICD-10-CM | POA: Diagnosis not present

## 2022-10-08 DIAGNOSIS — Z0000 Encounter for general adult medical examination without abnormal findings: Secondary | ICD-10-CM | POA: Diagnosis not present

## 2022-10-08 DIAGNOSIS — E039 Hypothyroidism, unspecified: Secondary | ICD-10-CM | POA: Diagnosis not present

## 2022-10-08 DIAGNOSIS — Z13220 Encounter for screening for lipoid disorders: Secondary | ICD-10-CM | POA: Diagnosis not present

## 2022-10-08 DIAGNOSIS — M25552 Pain in left hip: Secondary | ICD-10-CM | POA: Diagnosis not present

## 2022-10-08 DIAGNOSIS — M25551 Pain in right hip: Secondary | ICD-10-CM | POA: Diagnosis not present

## 2022-10-08 DIAGNOSIS — Z23 Encounter for immunization: Secondary | ICD-10-CM | POA: Diagnosis not present

## 2022-10-08 DIAGNOSIS — R479 Unspecified speech disturbances: Secondary | ICD-10-CM | POA: Diagnosis not present

## 2022-10-08 DIAGNOSIS — Z79899 Other long term (current) drug therapy: Secondary | ICD-10-CM | POA: Diagnosis not present

## 2022-10-08 DIAGNOSIS — K220 Achalasia of cardia: Secondary | ICD-10-CM | POA: Diagnosis not present

## 2022-10-08 DIAGNOSIS — Z136 Encounter for screening for cardiovascular disorders: Secondary | ICD-10-CM | POA: Diagnosis not present

## 2022-10-09 ENCOUNTER — Other Ambulatory Visit: Payer: Self-pay | Admitting: Physician Assistant

## 2022-10-09 DIAGNOSIS — K220 Achalasia of cardia: Secondary | ICD-10-CM | POA: Diagnosis not present

## 2022-10-09 DIAGNOSIS — R1319 Other dysphagia: Secondary | ICD-10-CM

## 2022-10-09 DIAGNOSIS — K219 Gastro-esophageal reflux disease without esophagitis: Secondary | ICD-10-CM | POA: Diagnosis not present

## 2022-10-09 DIAGNOSIS — R1314 Dysphagia, pharyngoesophageal phase: Secondary | ICD-10-CM | POA: Diagnosis not present

## 2022-10-13 ENCOUNTER — Telehealth: Payer: Self-pay | Admitting: Family Medicine

## 2022-10-13 NOTE — Telephone Encounter (Signed)
[facility] records request to [facility].

## 2022-10-15 ENCOUNTER — Other Ambulatory Visit: Payer: Medicare Other

## 2022-10-24 ENCOUNTER — Other Ambulatory Visit: Payer: Medicare Other

## 2022-10-29 ENCOUNTER — Ambulatory Visit: Payer: Medicare Other | Admitting: Podiatry

## 2022-11-04 ENCOUNTER — Other Ambulatory Visit: Payer: Medicare Other

## 2022-11-10 ENCOUNTER — Other Ambulatory Visit: Payer: Medicare Other

## 2022-11-21 ENCOUNTER — Ambulatory Visit: Payer: Medicare Other | Admitting: Podiatry

## 2022-11-28 ENCOUNTER — Ambulatory Visit
Admission: RE | Admit: 2022-11-28 | Discharge: 2022-11-28 | Disposition: A | Discharge status: Home / Self Care | Payer: Medicare Other | Source: Ambulatory Visit | Attending: Physician Assistant | Admitting: Physician Assistant

## 2022-11-28 ENCOUNTER — Other Ambulatory Visit: Payer: Self-pay | Admitting: Physician Assistant

## 2022-11-28 DIAGNOSIS — K220 Achalasia of cardia: Secondary | ICD-10-CM

## 2022-11-28 DIAGNOSIS — R1319 Other dysphagia: Secondary | ICD-10-CM

## 2022-11-28 DIAGNOSIS — K224 Dyskinesia of esophagus: Secondary | ICD-10-CM | POA: Diagnosis not present

## 2022-11-28 DIAGNOSIS — R1310 Dysphagia, unspecified: Secondary | ICD-10-CM | POA: Diagnosis not present

## 2022-12-30 DIAGNOSIS — K220 Achalasia of cardia: Secondary | ICD-10-CM | POA: Diagnosis not present

## 2022-12-30 DIAGNOSIS — K219 Gastro-esophageal reflux disease without esophagitis: Secondary | ICD-10-CM | POA: Diagnosis not present

## 2023-01-26 ENCOUNTER — Ambulatory Visit (INDEPENDENT_AMBULATORY_CARE_PROVIDER_SITE_OTHER): Payer: 59 | Admitting: Podiatry

## 2023-01-26 ENCOUNTER — Encounter: Payer: Self-pay | Admitting: Podiatry

## 2023-01-26 DIAGNOSIS — M79675 Pain in left toe(s): Secondary | ICD-10-CM

## 2023-01-26 DIAGNOSIS — B351 Tinea unguium: Secondary | ICD-10-CM

## 2023-01-26 DIAGNOSIS — E039 Hypothyroidism, unspecified: Secondary | ICD-10-CM | POA: Diagnosis not present

## 2023-01-26 DIAGNOSIS — Q909 Down syndrome, unspecified: Secondary | ICD-10-CM | POA: Diagnosis not present

## 2023-01-26 DIAGNOSIS — M79674 Pain in right toe(s): Secondary | ICD-10-CM

## 2023-01-26 NOTE — Progress Notes (Signed)
Complaint:  Visit Type: Patient returns to my office for continued preventative foot care services. Complaint: Patient states" my nails have grown long and thick and become painful to walk and wear shoes"  The patient presents for preventative foot care services. No changes to ROS. Patient has Downs syndrome and is accompanied by his mother. He presents to the office last seen 5 months ago.  Podiatric Exam: Vascular: dorsalis pedis and posterior tibial pulses are palpable bilateral. Capillary return is immediate. Temperature gradient is WNL. Skin turgor WNL  Sensorium: Normal Semmes Weinstein monofilament test. Normal tactile sensation bilaterally. Nail Exam: Pt has thick disfigured discolored nails with subungual debris noted bilateral entire nail hallux through fifth toenails.  Maceration subungually on friable hallux nail left foot. Ulcer Exam: There is no evidence of ulcer or pre-ulcerative changes or infection. Orthopedic Exam: Muscle tone and strength are WNL. No limitations in general ROM. No crepitus or effusions noted. Foot type and digits show no abnormalities. HAV  B/L. Skin: No Porokeratosis. No infection or ulcers  Diagnosis:  Onychomycosis, , Pain in right toe, pain in left toes  Treatment & Plan Procedures and Treatment: Consent by patient was obtained for treatment procedures.   Debridement of mycotic and hypertrophic toenails, 1 through 5 bilateral and clearing of subungual debris. No ulceration, no infection noted. Right hallux toenail is friable with masceration noted subungually.  Neosporin/DSD Return Visit-Office Procedure: Patient instructed to return to the office for a follow up visit 12  weeks  for continued evaluation and treatment.    Gardiner Barefoot DPM

## 2023-01-29 DIAGNOSIS — E039 Hypothyroidism, unspecified: Secondary | ICD-10-CM | POA: Diagnosis not present

## 2023-04-29 ENCOUNTER — Ambulatory Visit: Payer: 59 | Admitting: Podiatry

## 2023-04-29 ENCOUNTER — Telehealth: Payer: Self-pay | Admitting: Podiatry

## 2023-04-29 NOTE — Telephone Encounter (Signed)
Pts mom left message today at 359am that she has woke up sick vomiting and she does not think we want her to come in.  I cxled appt and left message for pts mom to call to r/s appt.

## 2023-05-14 ENCOUNTER — Ambulatory Visit: Payer: 59 | Admitting: Podiatry

## 2023-08-03 ENCOUNTER — Encounter (HOSPITAL_COMMUNITY): Payer: Self-pay | Admitting: Anesthesiology

## 2023-08-03 NOTE — Progress Notes (Signed)
Pt's chart reviewed with Dr Tacy Dura- dental surgery scheduled at [facility] on 08/10/23 will need to be moved to the Main OR. LVM on after hours line at Dr Randa Evens office.

## 2023-08-03 NOTE — H&P (Signed)
  Patient: Ronald Le  PID: [id number]  DOB: 1991-06-20  SEX: Male   Patient referred by DDS for evaluation for full mouth extractions.  Ronald Le is a 32 y/o male with Downs syndrome. Accompanied by his mother.   Past Medical History:  Pneumonia, Hay Fever or Sinus Problems, Sleep Apnea, Emphysema, Thyroid trouble, Acid Reflux, Mental Health problems, Downs Syndrome    Medications: Synthroid, Omeprazole    Allergies:     NKDA    Surgeries:   Tubes in Ears, Hip surgery, Esophagus                                 Exam: BMI 24. Multiple carious teeth # 2, 3, 4, 5, 8, 9, 11, 12, 13, 14, 15, M, 23, 25, 26, 27, 28, 29, 30, 31.    No purulence, edema, fluctuance, trismus. Oral cancer screening negative. Pharynx clear. No lymphadenopathy.  Panorex:Multiple carious teeth # 2, 3, 4, 5, 8, 9, 11, 12, 13, 14, 15, M, 23, 25, 26, 27, 28, 29, 30, 31.   Impacted teeth # 6, 22.   Assessment: ASA 2. Non-restorable/ impacted  teeth  # 2, 3, 4, 5, 6, 8, 9, 11, 12, 13, 14, 15, M, 22, 23, 25, 26, 27, 28, 29, 30, 31.                  Plan: Extraction Teeth # 2, 3, 4, 5, 6, 8, 9, 11, 12, 13, 14, 15, M, 22, 23, 25, 26, 27, 28, 29, 30, 31. Alveoloplasty.  Hospital Day surgery.                 Rx: n               Risks and complications explained. Questions answered.   Georgia Lopes, DMD

## 2023-08-05 DIAGNOSIS — E039 Hypothyroidism, unspecified: Secondary | ICD-10-CM | POA: Diagnosis not present

## 2023-08-21 ENCOUNTER — Encounter (HOSPITAL_COMMUNITY): Payer: Self-pay | Admitting: Oral Surgery

## 2023-08-21 NOTE — Progress Notes (Signed)
SDW call  Patient's mother, Olegario Messier was given pre-op instructions over the phone. Sheverbalized understanding of instructions provided.     PCP:  Dr. Joselyn Arrow Cardiologist -  Pulmonary:    PPM/ICD - denies Device Orders - na Rep Notified - na   Chest x-ray - na EKG -  na Stress Test - ECHO -  Cardiac Cath -   Sleep Study/sleep apnea/CPAP: OSA. Mom states he threw his CPAP away  Non-diabetic   Blood Thinner Instructions: denies Aspirin Instructions:denies   ERAS Protcol - NPO   COVID TEST- na    Anesthesia review: Yes. Complications with anesthesia resulting in resp arrest, heart murmur, OSA, down's syndrome  Your procedure is scheduled on Tuesday August 25, 2023  Report to [facility] Main Entrance "A" at  0800  A.M., then check in with the Admitting office.  Call this number if you have problems the morning of surgery:  [phone]   If you have any questions prior to your surgery date call [phone]: Open Monday-Friday 8am-4pm If you experience any cold or flu symptoms such as cough, fever, chills, shortness of breath, etc. between now and your scheduled surgery, please notify us at the above number     Remember:  Do not eat or drink after midnight the night before your surgery  Take these medicines the morning of surgery with A SIP OF WATER:  Levothyroxine, prilosec  As needed: tylenol  As of today, STOP taking any Aspirin (unless otherwise instructed by your surgeon) Aleve, Naproxen, Ibuprofen, Motrin, Advil, Goody's, BC's, all herbal medications, fish oil, and all vitamins.

## 2023-08-21 NOTE — Anesthesia Preprocedure Evaluation (Signed)
Anesthesia Evaluation    Airway        Dental   Pulmonary           Cardiovascular      Neuro/Psych    GI/Hepatic   Endo/Other    Renal/GU      Musculoskeletal   Abdominal   Peds  Hematology   Anesthesia Other Findings   Reproductive/Obstetrics                              Anesthesia Physical Anesthesia Plan  ASA:   Anesthesia Plan:    Post-op Pain Management:    Induction:   PONV Risk Score and Plan:   Airway Management Planned:   Additional Equipment:   Intra-op Plan:   Post-operative Plan:   Informed Consent:   Plan Discussed with:   Anesthesia Plan Comments: (PAT note by Antionette Poles, PA-C: 32 yo male with pertinent hx including Down syndrome, hypothyroid, OSA intolerant to CPAP, GERD, recurrent pneumonia, esophageal achalasia, limited verbal communication.   Cervical spine xrays 02/05/2017 show normal atlanto dens interval, 2.8 mm.   Pt will need DOS labs and evaluation.  )         Anesthesia Quick Evaluation

## 2023-08-21 NOTE — Progress Notes (Signed)
Anesthesia Chart Review: Same day workup  32 yo male with pertinent hx including Down syndrome, hypothyroid, OSA intolerant to CPAP, GERD, recurrent pneumonia, esophageal achalasia, limited verbal communication.   Cervical spine xrays 02/05/2017 show normal atlanto dens interval, 2.8 mm.   Pt will need DOS labs and evaluation.   Zannie Cove [facility] Short Stay Center/Anesthesiology Phone [phone] 08/21/2023 2:49 PM

## 2023-08-25 ENCOUNTER — Ambulatory Visit (HOSPITAL_COMMUNITY): Admission: RE | Admit: 2023-08-25 | Payer: 59 | Source: Ambulatory Visit | Admitting: Oral Surgery

## 2023-08-25 SURGERY — DENTAL RESTORATION/EXTRACTIONS
Anesthesia: General

## 2023-09-01 ENCOUNTER — Ambulatory Visit (INDEPENDENT_AMBULATORY_CARE_PROVIDER_SITE_OTHER): Payer: 59 | Admitting: Podiatry

## 2023-09-01 ENCOUNTER — Encounter: Payer: Self-pay | Admitting: Podiatry

## 2023-09-01 DIAGNOSIS — B351 Tinea unguium: Secondary | ICD-10-CM | POA: Diagnosis not present

## 2023-09-01 DIAGNOSIS — M79674 Pain in right toe(s): Secondary | ICD-10-CM | POA: Diagnosis not present

## 2023-09-01 DIAGNOSIS — Q909 Down syndrome, unspecified: Secondary | ICD-10-CM

## 2023-09-01 DIAGNOSIS — M79675 Pain in left toe(s): Secondary | ICD-10-CM

## 2023-09-01 NOTE — Progress Notes (Signed)
Complaint:  Visit Type: Patient returns to my office for continued preventative foot care services. Complaint: Patient states" my nails have grown long and thick and become painful to walk and wear shoes"  The patient presents for preventative foot care services. No changes to ROS. Patient has Downs syndrome and is accompanied by his mother. He presents to the office last seen 7  months ago.  Podiatric Exam: Vascular: dorsalis pedis and posterior tibial pulses are palpable bilateral. Capillary return is immediate. Temperature gradient is WNL. Skin turgor WNL  Sensorium: Normal Semmes Weinstein monofilament test. Normal tactile sensation bilaterally. Nail Exam: Pt has thick disfigured discolored nails with subungual debris noted bilateral entire nail hallux through fifth toenails.   Ulcer Exam: There is no evidence of ulcer or pre-ulcerative changes or infection. Orthopedic Exam: Muscle tone and strength are WNL. No limitations in general ROM. No crepitus or effusions noted. Foot type and digits show no abnormalities. HAV  B/L. Skin: No Porokeratosis. No infection or ulcers  Diagnosis:  Onychomycosis, , Pain in right toe, pain in left toes  Treatment & Plan Procedures and Treatment: Consent by patient was obtained for treatment procedures.   Debridement of mycotic and hypertrophic toenails, 1 through 5 bilateral and clearing of subungual debris. No ulceration, no infection noted.  Return Visit-Office Procedure: Patient instructed to return to the office for a follow up visit 12  weeks  for continued evaluation and treatment.    Helane Gunther DPM

## 2023-09-21 NOTE — H&P (Signed)
  Patient: Ronald Le  PID: [id number]  DOB: 1991-06-20  SEX: Male   Patient referred by DDS for evaluation for full mouth extractions.  Ronald Le is a 32 y/o male with Downs syndrome. Accompanied by his mother.   Past Medical History:  Pneumonia, Hay Fever or Sinus Problems, Sleep Apnea, Emphysema, Thyroid trouble, Acid Reflux, Mental Health problems, Downs Syndrome    Medications: Synthroid, Omeprazole    Allergies:     NKDA    Surgeries:   Tubes in Ears, Hip surgery, Esophagus                                 Exam: BMI 24. Multiple carious teeth # 2, 3, 4, 5, 8, 9, 11, 12, 13, 14, 15, M, 23, 25, 26, 27, 28, 29, 30, 31.    No purulence, edema, fluctuance, trismus. Oral cancer screening negative. Pharynx clear. No lymphadenopathy.  Panorex:Multiple carious teeth # 2, 3, 4, 5, 8, 9, 11, 12, 13, 14, 15, M, 23, 25, 26, 27, 28, 29, 30, 31.   Impacted teeth # 6, 22.   Assessment: ASA 2. Non-restorable/ impacted  teeth  # 2, 3, 4, 5, 6, 8, 9, 11, 12, 13, 14, 15, M, 22, 23, 25, 26, 27, 28, 29, 30, 31.                  Plan: Extraction Teeth # 2, 3, 4, 5, 6, 8, 9, 11, 12, 13, 14, 15, M, 22, 23, 25, 26, 27, 28, 29, 30, 31. Alveoloplasty.  Hospital Day surgery.                 Rx: n               Risks and complications explained. Questions answered.   Georgia Lopes, DMD

## 2023-09-23 ENCOUNTER — Encounter (HOSPITAL_COMMUNITY): Payer: Self-pay | Admitting: Oral Surgery

## 2023-09-23 ENCOUNTER — Other Ambulatory Visit: Payer: Self-pay

## 2023-09-23 NOTE — Progress Notes (Addendum)
PCP - Dr. Joselyn Arrow  Cardiologist - denies  PPM/ICD - denies Device Orders - n/a Rep Notified - n/a  Chest x-ray - 05-18-22 EKG - 05-18-22 Stress Test - 12-02-19 ECHO -  Cardiac Cath -   CPAP - No longer has it awaiting date for sleep test  DM denies   Blood Thinner Instructions: denies Aspirin Instructions: no  ERAS Protcol - NPO  COVID TEST- no  Anesthesia review: no   Information obtained by patient's mother.  Patient verbally denies any shortness of breath, fever, cough and chest pain during phone call   -------------  SDW INSTRUCTIONS given:  Your procedure is scheduled on September 25, 2023.  Report to [facility] Main Entrance "A" at 5:30 A.M., and check in at the Admitting office.  Call this number if you have problems the morning of surgery:  [phone]   Remember:  Do not eat or drink after midnight the night before your surgery      Take these medicines the morning of surgery with A SIP OF WATER  levothyroxine (SYNTHROID)  omeprazole (PRILOSEC)   IF NEEDED acetaminophen (TYLENOL)  loratadine (CLARITIN)    As of today, STOP taking any Aspirin (unless otherwise instructed by your surgeon) Aleve, Naproxen, Ibuprofen, Motrin, Advil, Goody's, BC's, all herbal medications, fish oil, and all vitamins.                      Do not wear jewelry, make up, or nail polish            Do not wear lotions, powders, perfumes/colognes, or deodorant.            Do not shave 48 hours prior to surgery.  Men may shave face and neck.            Do not bring valuables to the hospital.            [facility] is not responsible for any belongings or valuables.  Do NOT Smoke (Tobacco/Vaping) 24 hours prior to your procedure If you use a CPAP at night, you may bring all equipment for your overnight stay.   Contacts, glasses, dentures or bridgework may not be worn into surgery.      For patients admitted to the hospital, discharge time will be determined by your treatment  team.   Patients discharged the day of surgery will not be allowed to drive home, and someone needs to stay with them for 24 hours.    Special instructions:   [facility]- Preparing For Surgery  Before surgery, you can play an important role. Because skin is not sterile, your skin needs to be as free of germs as possible. You can reduce the number of germs on your skin by washing with CHG (chlorahexidine gluconate) Soap before surgery.  CHG is an antiseptic cleaner which kills germs and bonds with the skin to continue killing germs even after washing.    Oral Hygiene is also important to reduce your risk of infection.  Remember - BRUSH YOUR TEETH THE MORNING OF SURGERY WITH YOUR REGULAR TOOTHPASTE  Please do not use if you have an allergy to CHG or antibacterial soaps. If your skin becomes reddened/irritated stop using the CHG.  Do not shave (including legs and underarms) for at least 48 hours prior to first CHG shower. It is OK to shave your face.  Please follow these instructions carefully.   Shower the NIGHT BEFORE SURGERY and the MORNING OF SURGERY with  DIAL Soap.   Pat yourself dry with a CLEAN TOWEL.  Wear CLEAN PAJAMAS to bed the night before surgery  Place CLEAN SHEETS on your bed the night of your first shower and DO NOT SLEEP WITH PETS.   Day of Surgery: Please shower morning of surgery  Wear Clean/Comfortable clothing the morning of surgery Do not apply any deodorants/lotions.   Remember to brush your teeth WITH YOUR REGULAR TOOTHPASTE.   Questions were answered. Patient verbalized understanding of instructions.

## 2023-09-24 NOTE — Anesthesia Preprocedure Evaluation (Addendum)
Anesthesia Evaluation  Patient identified by MRN, date of birth, ID band Patient awake    Reviewed: Allergy & Precautions, H&P , NPO status , Patient's Chart, lab work & pertinent test results  Airway Mallampati: II  TM Distance: >3 FB Neck ROM: Full    Dental no notable dental hx. (+) Dental Advisory Given, Poor Dentition   Pulmonary sleep apnea and Continuous Positive Airway Pressure Ventilation , pneumonia   Pulmonary exam normal breath sounds clear to auscultation       Cardiovascular negative cardio ROS Normal cardiovascular exam Rhythm:Regular Rate:Normal     Neuro/Psych Down's Syndrome  negative neurological ROS  negative psych ROS   GI/Hepatic Neg liver ROS,GERD  Medicated,,  Endo/Other  negative endocrine ROSHypothyroidism    Renal/GU negative Renal ROS     Musculoskeletal negative musculoskeletal ROS (+)    Abdominal   Peds  Hematology negative hematology ROS (+)   Anesthesia Other Findings   Reproductive/Obstetrics negative OB ROS                             Anesthesia Physical Anesthesia Plan  ASA: 3  Anesthesia Plan: General   Post-op Pain Management: Minimal or no pain anticipated and Precedex   Induction: Intravenous  PONV Risk Score and Plan: 2 and Ondansetron, Dexamethasone, Treatment may vary due to age or medical condition and Midazolam  Airway Management Planned: Oral ETT  Additional Equipment: None  Intra-op Plan:   Post-operative Plan: Extubation in OR  Informed Consent: I have reviewed the patients History and Physical, chart, labs and discussed the procedure including the risks, benefits and alternatives for the proposed anesthesia with the patient or authorized representative who has indicated his/her understanding and acceptance.     Dental advisory given  Plan Discussed with: CRNA and Anesthesiologist  Anesthesia Plan Comments: (PAT note by  Antionette Poles, PA-C: 32 yo male with pertinent hx including Down syndrome, hypothyroid, OSA intolerant to CPAP, GERD, recurrent pneumonia, esophageal achalasia, limited verbal communication.   Cervical spine xrays 02/05/2017 show normal atlanto dens interval, 2.8 mm.  Pt will need DOS labs and evaluation.  )        Anesthesia Quick Evaluation

## 2023-09-25 ENCOUNTER — Ambulatory Visit (HOSPITAL_COMMUNITY)
Admission: RE | Admit: 2023-09-25 | Discharge: 2023-09-25 | Disposition: A | Discharge status: Home / Self Care | Payer: 59 | Attending: Oral Surgery | Admitting: Oral Surgery

## 2023-09-25 ENCOUNTER — Encounter (HOSPITAL_COMMUNITY): Payer: Self-pay | Admitting: Oral Surgery

## 2023-09-25 ENCOUNTER — Encounter (HOSPITAL_COMMUNITY)
Admission: RE | Disposition: A | Discharge status: Home / Self Care | Payer: Self-pay | Source: Home / Self Care | Attending: Oral Surgery

## 2023-09-25 ENCOUNTER — Ambulatory Visit (HOSPITAL_COMMUNITY): Payer: 59

## 2023-09-25 ENCOUNTER — Ambulatory Visit (HOSPITAL_BASED_OUTPATIENT_CLINIC_OR_DEPARTMENT_OTHER): Payer: 59

## 2023-09-25 ENCOUNTER — Other Ambulatory Visit: Payer: Self-pay

## 2023-09-25 ENCOUNTER — Other Ambulatory Visit (HOSPITAL_COMMUNITY): Payer: Self-pay

## 2023-09-25 DIAGNOSIS — K219 Gastro-esophageal reflux disease without esophagitis: Secondary | ICD-10-CM | POA: Insufficient documentation

## 2023-09-25 DIAGNOSIS — G4733 Obstructive sleep apnea (adult) (pediatric): Secondary | ICD-10-CM | POA: Diagnosis not present

## 2023-09-25 DIAGNOSIS — K029 Dental caries, unspecified: Secondary | ICD-10-CM | POA: Diagnosis not present

## 2023-09-25 DIAGNOSIS — Q909 Down syndrome, unspecified: Secondary | ICD-10-CM | POA: Diagnosis not present

## 2023-09-25 DIAGNOSIS — G4730 Sleep apnea, unspecified: Secondary | ICD-10-CM | POA: Diagnosis not present

## 2023-09-25 DIAGNOSIS — K011 Impacted teeth: Secondary | ICD-10-CM | POA: Insufficient documentation

## 2023-09-25 HISTORY — PX: TOOTH EXTRACTION: SHX859

## 2023-09-25 HISTORY — DX: Unspecified osteoarthritis, unspecified site: M19.90

## 2023-09-25 LAB — BASIC METABOLIC PANEL
Anion gap: 11 (ref 5–15)
BUN: 10 mg/dL (ref 6–20)
CO2: 25 mmol/L (ref 22–32)
Calcium: 9 mg/dL (ref 8.9–10.3)
Chloride: 104 mmol/L (ref 98–111)
Creatinine, Ser: 0.93 mg/dL (ref 0.61–1.24)
GFR, Estimated: >60 mL/min (ref >60)
Glucose, Bld: 102 mg/dL — ABNORMAL HIGH (ref 70–99)
Potassium: 4 mmol/L (ref 3.5–5.1)
Sodium: 140 mmol/L (ref 135–145)

## 2023-09-25 LAB — CBC
HCT: 43.2 % (ref 39.0–52.0)
Hemoglobin: 14.4 g/dL (ref 13.0–17.0)
MCH: 30.9 pg (ref 26.0–34.0)
MCHC: 33.3 g/dL (ref 30.0–36.0)
MCV: 92.7 fL (ref 80.0–100.0)
Platelets: 251 10*3/uL (ref 150–400)
RBC: 4.66 MIL/uL (ref 4.22–5.81)
RDW: 15.2 % (ref 11.5–15.5)
WBC: 5.2 10*3/uL (ref 4.0–10.5)
nRBC: 0 % (ref 0.0–0.2)

## 2023-09-25 SURGERY — DENTAL RESTORATION/EXTRACTIONS
Anesthesia: General

## 2023-09-25 MED ORDER — OXYMETAZOLINE HCL 0.05 % NA SOLN
NASAL | Status: AC
  Filled 2023-09-25: qty 30

## 2023-09-25 MED ORDER — ROCURONIUM BROMIDE 10 MG/ML (PF) SYRINGE
PREFILLED_SYRINGE | INTRAVENOUS | Status: DC | PRN
  Administered 2023-09-25: 30 mg via INTRAVENOUS

## 2023-09-25 MED ORDER — PROPOFOL 10 MG/ML IV BOLUS
INTRAVENOUS | Status: AC
  Filled 2023-09-25: qty 20

## 2023-09-25 MED ORDER — CHLORHEXIDINE GLUCONATE 0.12 % MT SOLN
15.0000 mL | Freq: Once | OROMUCOSAL | Status: AC
  Administered 2023-09-25: 15 mL via OROMUCOSAL
  Filled 2023-09-25: qty 15

## 2023-09-25 MED ORDER — ONDANSETRON HCL 4 MG/2ML IJ SOLN
INTRAMUSCULAR | Status: AC
  Filled 2023-09-25: qty 2

## 2023-09-25 MED ORDER — ACETAMINOPHEN 500 MG PO TABS
1000.0000 mg | ORAL_TABLET | Freq: Once | ORAL | Status: AC
Start: 2023-09-25 — End: 2023-09-25
  Administered 2023-09-25: 1000 mg via ORAL
  Filled 2023-09-25: qty 2

## 2023-09-25 MED ORDER — LACTATED RINGERS IV SOLN: INTRAVENOUS | Status: DC

## 2023-09-25 MED ORDER — ORAL CARE MOUTH RINSE: 15.0000 mL | Freq: Once | OROMUCOSAL | Status: AC

## 2023-09-25 MED ORDER — LIDOCAINE-EPINEPHRINE 2 %-1:100000 IJ SOLN
INTRAMUSCULAR | Status: AC
  Filled 2023-09-25: qty 1.7

## 2023-09-25 MED ORDER — DEXAMETHASONE SODIUM PHOSPHATE 10 MG/ML IJ SOLN
INTRAMUSCULAR | Status: DC | PRN
  Administered 2023-09-25: 10 mg via INTRAVENOUS

## 2023-09-25 MED ORDER — SODIUM CHLORIDE 0.9 % IV SOLN: INTRAVENOUS | Status: DC | PRN

## 2023-09-25 MED ORDER — OXYMETAZOLINE HCL 0.05 % NA SOLN
NASAL | Status: DC | PRN
  Administered 2023-09-25 (×2): 2 via NASAL

## 2023-09-25 MED ORDER — OXYCODONE HCL 5 MG PO TABS: 5.0000 mg | ORAL_TABLET | Freq: Once | ORAL | Status: AC | PRN

## 2023-09-25 MED ORDER — ONDANSETRON HCL 4 MG/2ML IJ SOLN: 4.0000 mg | Freq: Once | INTRAMUSCULAR | Status: DC | PRN

## 2023-09-25 MED ORDER — MIDAZOLAM HCL 2 MG/2ML IJ SOLN
INTRAMUSCULAR | Status: AC
  Filled 2023-09-25: qty 2

## 2023-09-25 MED ORDER — FENTANYL CITRATE (PF) 250 MCG/5ML IJ SOLN
INTRAMUSCULAR | Status: AC
  Filled 2023-09-25: qty 5

## 2023-09-25 MED ORDER — PHENYLEPHRINE HCL (PRESSORS) 10 MG/ML IV SOLN
INTRAVENOUS | Status: DC | PRN
  Administered 2023-09-25: 80 ug via INTRAVENOUS

## 2023-09-25 MED ORDER — LIDOCAINE-EPINEPHRINE 1 %-1:100000 IJ SOLN
INTRAMUSCULAR | Status: AC
  Filled 2023-09-25: qty 1

## 2023-09-25 MED ORDER — CEFAZOLIN SODIUM-DEXTROSE 2-4 GM/100ML-% IV SOLN
2.0000 g | INTRAVENOUS | Status: AC
  Administered 2023-09-25: 2 g via INTRAVENOUS
  Filled 2023-09-25: qty 100

## 2023-09-25 MED ORDER — MIDAZOLAM HCL 2 MG/2ML IJ SOLN
INTRAMUSCULAR | Status: DC | PRN
  Administered 2023-09-25: 2 mg via INTRAVENOUS

## 2023-09-25 MED ORDER — OXYCODONE HCL 5 MG/5ML PO SOLN
5.0000 mg | Freq: Once | ORAL | Status: AC | PRN
  Administered 2023-09-25: 5 mg via ORAL

## 2023-09-25 MED ORDER — AMOXICILLIN 500 MG PO CAPS
500.0000 mg | ORAL_CAPSULE | Freq: Three times a day (TID) | ORAL | 0 refills | Status: DC
  Filled 2023-09-25: qty 21, 7d supply, fill #0

## 2023-09-25 MED ORDER — ACETAMINOPHEN 325 MG PO TABS: 325.0000 mg | ORAL_TABLET | ORAL | Status: DC | PRN

## 2023-09-25 MED ORDER — ONDANSETRON HCL 4 MG/2ML IJ SOLN
INTRAMUSCULAR | Status: DC | PRN
  Administered 2023-09-25: 4 mg via INTRAVENOUS

## 2023-09-25 MED ORDER — CELECOXIB 200 MG PO CAPS
200.0000 mg | ORAL_CAPSULE | Freq: Once | ORAL | Status: AC
  Administered 2023-09-25: 200 mg via ORAL
  Filled 2023-09-25: qty 1

## 2023-09-25 MED ORDER — FENTANYL CITRATE (PF) 100 MCG/2ML IJ SOLN
25.0000 ug | INTRAMUSCULAR | Status: DC | PRN
  Administered 2023-09-25 (×3): 25 ug via INTRAVENOUS

## 2023-09-25 MED ORDER — 0.9 % SODIUM CHLORIDE (POUR BTL) OPTIME
TOPICAL | Status: DC | PRN
  Administered 2023-09-25: 1000 mL

## 2023-09-25 MED ORDER — SUGAMMADEX SODIUM 200 MG/2ML IV SOLN
INTRAVENOUS | Status: DC | PRN
  Administered 2023-09-25 (×2): 100 mg via INTRAVENOUS

## 2023-09-25 MED ORDER — FENTANYL CITRATE (PF) 250 MCG/5ML IJ SOLN
INTRAMUSCULAR | Status: DC | PRN
  Administered 2023-09-25: 50 ug via INTRAVENOUS

## 2023-09-25 MED ORDER — SODIUM CHLORIDE 0.9 % IR SOLN
Status: DC | PRN
  Administered 2023-09-25: 1000 mL

## 2023-09-25 MED ORDER — OXYCODONE HCL 5 MG/5ML PO SOLN
ORAL | Status: AC
  Filled 2023-09-25: qty 5

## 2023-09-25 MED ORDER — LIDOCAINE-EPINEPHRINE 2 %-1:100000 IJ SOLN
INTRAMUSCULAR | Status: DC | PRN
  Administered 2023-09-25: 20 mL

## 2023-09-25 MED ORDER — PROPOFOL 10 MG/ML IV BOLUS
INTRAVENOUS | Status: DC | PRN
  Administered 2023-09-25: 140 mg via INTRAVENOUS

## 2023-09-25 MED ORDER — LIDOCAINE-EPINEPHRINE 2 %-1:100000 IJ SOLN
INTRAMUSCULAR | Status: AC
  Filled 2023-09-25: qty 1

## 2023-09-25 MED ORDER — FENTANYL CITRATE (PF) 100 MCG/2ML IJ SOLN
INTRAMUSCULAR | Status: AC
  Filled 2023-09-25: qty 2

## 2023-09-25 MED ORDER — LIDOCAINE 2% (20 MG/ML) 5 ML SYRINGE
INTRAMUSCULAR | Status: DC | PRN
  Administered 2023-09-25: 100 mg via INTRAVENOUS

## 2023-09-25 MED ORDER — LIDOCAINE 2% (20 MG/ML) 5 ML SYRINGE
INTRAMUSCULAR | Status: AC
  Filled 2023-09-25: qty 5

## 2023-09-25 MED ORDER — OXYCODONE-ACETAMINOPHEN 5-325 MG PO TABS
1.0000 | ORAL_TABLET | ORAL | 0 refills | Status: AC | PRN
  Filled 2023-09-25: qty 15, 3d supply, fill #0

## 2023-09-25 MED ORDER — ROCURONIUM BROMIDE 10 MG/ML (PF) SYRINGE
PREFILLED_SYRINGE | INTRAVENOUS | Status: AC
  Filled 2023-09-25: qty 10

## 2023-09-25 MED ORDER — DEXAMETHASONE SODIUM PHOSPHATE 10 MG/ML IJ SOLN
INTRAMUSCULAR | Status: AC
  Filled 2023-09-25: qty 1

## 2023-09-25 MED ORDER — MEPERIDINE HCL 25 MG/ML IJ SOLN: 6.2500 mg | INTRAMUSCULAR | Status: DC | PRN

## 2023-09-25 MED ORDER — ACETAMINOPHEN 160 MG/5ML PO SOLN: 325.0000 mg | ORAL | Status: DC | PRN

## 2023-09-25 SURGICAL SUPPLY — 35 items
BAG COUNTER SPONGE SURGICOUNT (BAG) IMPLANT
BAG SPNG CNTER NS LX DISP (BAG)
BLADE SURG 15 STRL LF DISP TIS (BLADE) ×1 IMPLANT
BLADE SURG 15 STRL SS (BLADE) ×1
BUR CROSS CUT FISSURE 1.6 (BURR) ×1 IMPLANT
BUR EGG ELITE 4.0 (BURR) ×1 IMPLANT
CANISTER SUCT 3000ML PPV (MISCELLANEOUS) ×1 IMPLANT
COVER SURGICAL LIGHT HANDLE (MISCELLANEOUS) ×1 IMPLANT
GAUZE PACKING FOLDED 2 STR (GAUZE/BANDAGES/DRESSINGS) ×1 IMPLANT
GLOVE BIO SURGEON STRL SZ 6.5 (GLOVE) IMPLANT
GLOVE BIO SURGEON STRL SZ7 (GLOVE) IMPLANT
GLOVE BIO SURGEON STRL SZ8 (GLOVE) ×1 IMPLANT
GLOVE BIOGEL PI IND STRL 6.5 (GLOVE) IMPLANT
GLOVE BIOGEL PI IND STRL 7.0 (GLOVE) IMPLANT
GOWN STRL REUS W/ TWL LRG LVL3 (GOWN DISPOSABLE) ×1 IMPLANT
GOWN STRL REUS W/ TWL XL LVL3 (GOWN DISPOSABLE) ×1 IMPLANT
GOWN STRL REUS W/TWL LRG LVL3 (GOWN DISPOSABLE) ×1
GOWN STRL REUS W/TWL XL LVL3 (GOWN DISPOSABLE) ×1
IV NS 1000ML (IV SOLUTION) ×1
IV NS 1000ML BAXH (IV SOLUTION) ×1 IMPLANT
KIT BASIN OR (CUSTOM PROCEDURE TRAY) ×1 IMPLANT
KIT TURNOVER KIT B (KITS) ×1 IMPLANT
NDL HYPO 25GX1X1/2 BEV (NEEDLE) ×2 IMPLANT
NEEDLE HYPO 25GX1X1/2 BEV (NEEDLE) ×2 IMPLANT
NS IRRIG 1000ML POUR BTL (IV SOLUTION) ×1 IMPLANT
PAD ARMBOARD 7.5X6 YLW CONV (MISCELLANEOUS) ×1 IMPLANT
SLEEVE IRRIGATION ELITE 7 (MISCELLANEOUS) ×1 IMPLANT
SPIKE FLUID TRANSFER (MISCELLANEOUS) ×1 IMPLANT
SPONGE SURGIFOAM ABS GEL 12-7 (HEMOSTASIS) IMPLANT
SUT CHROMIC 3 0 PS 2 (SUTURE) ×1 IMPLANT
SYR BULB IRRIG 60ML STRL (SYRINGE) ×1 IMPLANT
SYR CONTROL 10ML LL (SYRINGE) ×1 IMPLANT
TRAY ENT MC OR (CUSTOM PROCEDURE TRAY) ×1 IMPLANT
TUBING IRRIGATION (MISCELLANEOUS) ×1 IMPLANT
YANKAUER SUCT BULB TIP NO VENT (SUCTIONS) ×1 IMPLANT

## 2023-09-25 NOTE — Op Note (Signed)
09/25/2023  8:27 AM  PATIENT:  Ronald Le  32 y.o. male  PRE-OPERATIVE DIAGNOSIS:  nonrestorable teeth # 2, 3, 4, 5, 8, 9, 11, 12, 13, 14, 15, 23, 24, 25, 26, 27, 28, 29, 30, 31 secondary to dental caries. Impacted teeth # 6, 22.  POST-OPERATIVE DIAGNOSIS:  SAME  PROCEDURE:  Procedure(s): EXTRACTIONS teeth # 2, 3, 4, 5, 6, 8, 9, 11, 12, 13, 14, 15, 22, 23, 24, 25, 26, 27, 28, 29, 30, 31   SURGEON:  Surgeon(s): Ocie Doyne, DMD  ANESTHESIA:   local and general  EBL:  minimal  DRAINS: none   SPECIMEN:  No Specimen  COUNTS:  YES  PLAN OF CARE: Discharge to home after PACU  PATIENT DISPOSITION:  PACU - hemodynamically stable.   PROCEDURE DETAILS: Dictation # [id number]  Georgia Lopes, DMD 09/25/2023 8:27 AM                09/25/2023  8:27 AM  PATIENT:  Ronald Le  32 y.o. male  PRE-OPERATIVE DIAGNOSIS:  nonrestorable  POST-OPERATIVE DIAGNOSIS:  SAME  PROCEDURE:  Procedure(s): DENTAL RESTORATION/EXTRACTIONS  SURGEON:  Surgeon(s): Ocie Doyne, DMD  ANESTHESIA:   local and general  EBL:  minimal  DRAINS: none   SPECIMEN:  No Specimen  COUNTS:  YES  PLAN OF CARE: Discharge to home after PACU  PATIENT DISPOSITION:  PACU - hemodynamically stable.   PROCEDURE DETAILS: Dictation #  Georgia Lopes, DMD 09/25/2023 8:27 AM

## 2023-09-25 NOTE — Op Note (Unsigned)
NAME: Ronald Le, Ronald Le MEDICAL RECORD NO: [id number] ACCOUNT NO: [id number] DATE OF BIRTH: October 24, 1991 FACILITY: [facility] LOCATION: [facility] PHYSICIAN: Georgia Lopes, DDS  Operative Report   DATE OF PROCEDURE: 09/25/2023  PREOPERATIVE DIAGNOSIS:  Nonrestorable teeth secondary to dental caries numbers 2, 3, 4, 5, 8, 9, 11, 12, 13, 14, 15, 23, 24, 25, 26, 27, 28, 29, 30, 31.  Impacted teeth number 6 and 22.  POSTOPERATIVE DIAGNOSIS:  Nonrestorable teeth secondary to dental caries numbers 2, 3, 4, 5, 8, 9, 11, 12, 13, 14, 15, 23, 24, 25, 26, 27, 28, 29, 30, 31.  Impacted teeth number 6 and 22.  PROCEDURE:  Extraction teeth numbers 2, 3, 4, 5, 6, 8, 9, 11, 12, 13, 14, 15, 22, 23, 24, 25, 26, 27, 28, 29, 30, 31.  SURGEON:  Georgia Lopes, DDS  ANESTHESIA:  General, nasal intubation, Dr. Ardeen Garland attending.  DESCRIPTION OF PROCEDURE:  The patient was taken to the operating room and placed on the table in supine position.  General anesthesia was administered and nasal endotracheal tube was placed and secured.  The eyes were protected.  The patient was draped  for surgery.  Timeout was performed.  The posterior pharynx was suctioned and a throat pack was placed.  2% lidocaine 1:100,000 epinephrine was infiltrated in an inferior alveolar block on the right and left side with buccal infiltration in the anterior  mandible.  Local was also administered in the pallet and buccal in the maxilla around the teeth to be removed.  A bite block was placed on the right side of the mouth.  A sweetheart retractor was used to retract the tongue.  A #15 blade was used to make  an incision along the alveolar crest in the area of the first molar and carried forward along the edentulous portion of the crest until tooth #23 was encountered and then the incision was created both buccally and lingually in the gingival sulcus until  tooth #27 was encountered.  The periosteum was reflected.  The teeth were elevated.  Teeth  numbers 23, 24, 25, 26 and 27 were removed with the dental forceps.  Then a Stryker handpiece with fisher bur was used under irrigation to remove the bone  overlying the impacted tooth #22.  The tooth was sectioned and the crown was removed and the root was removed then the socket was curetted inside the sockets of the remaining teeth that was curetted and then the bone was smooth with the bone file and  then closed with 3-0 chromic then the left maxilla was operated.  The 15 blade was used to make an incision around teeth numbers 15, 14, 13, 12, and 11 in the buccal and palatal sulcus.  The gingiva and then the periosteum was reflected.  The teeth were  elevated and crumbled upon attempted elevation.  Stryker handpiece was used to remove the interproximal bone between these teeth and then the teeth were elevated and removed with the 301 elevator and forceps.  The sockets were then curetted and irrigated  and the area was closed with 3-0 chromic.  The bite block was positioned on the other side of the mouth.  The 15 blade was used to make an incision around teeth numbers 2, 3, 4, 5 and 6.  Periosteum was reflected and teeth were elevated.  Teeth number  2, 3, 4 and 5 were removed with the dental forceps.  Tooth #6 required removal of bone overlying the impacted tooth.  Once the  crown was exposed the tooth was elevated and removed with the dental forceps.  The sockets were then curetted in the right  maxilla.  The bone was smoothed with the bone file and the area was irrigated and closed with 3-0 chromic.  Then in the right mandible, the 15 blade was used to make an incision around teeth numbers 28, 29, 30 and 31.  The periosteum was reflected.   Teeth were elevated and removed with the dental forceps.  The sockets were curetted and debrided and irrigated and then closed with 3-0 chromic.  Additional local anesthesia was administered.  The oral cavity was then irrigated and suctioned.  The throat  pack was  removed.  The patient was left under the care of anesthesia for extubation and transport to recovery room with plans for discharge home through day surgery.  ESTIMATED BLOOD LOSS:  Minimal.  COMPLICATIONS:  None.  SPECIMENS:  None.   PUS D: 09/25/2023 8:32:15 am T: 09/25/2023 11:42:00 am  JOB: [id number]/ [id number]

## 2023-09-25 NOTE — Transfer of Care (Signed)
Immediate Anesthesia Transfer of Care Note  Patient: NAIF ALABI  Procedure(s) Performed: DENTAL RESTORATION/EXTRACTIONS  Patient Location: PACU  Anesthesia Type:General  Level of Consciousness: awake, patient cooperative, and responds to stimulation  Airway & Oxygen Therapy: Patient Spontanous Breathing and Patient connected to face mask oxygen  Post-op Assessment: Report given to RN and Patient moving all extremities X 4  Post vital signs: Reviewed and stable  Last Vitals:  Vitals Value Taken Time  BP 137/108 09/25/23 0855  Temp    Pulse 96 09/25/23 0858  Resp 16 09/25/23 0858  SpO2 93 % 09/25/23 0858  Vitals shown include unfiled device data.  Last Pain: There were no vitals filed for this visit.    Patients Stated Pain Goal: 3 (09/25/23 5956)  Complications: No notable events documented.

## 2023-09-25 NOTE — Progress Notes (Signed)
Patient Mother and sister at bedside to confirm

## 2023-09-25 NOTE — Anesthesia Postprocedure Evaluation (Signed)
Anesthesia Post Note  Patient: Ronald Le  Procedure(s) Performed: DENTAL RESTORATION/EXTRACTIONS     Patient location during evaluation: PACU Anesthesia Type: General Level of consciousness: awake and alert Pain management: pain level controlled Vital Signs Assessment: post-procedure vital signs reviewed and stable Respiratory status: spontaneous breathing, nonlabored ventilation, respiratory function stable and patient connected to nasal cannula oxygen Cardiovascular status: blood pressure returned to baseline and stable Postop Assessment: no apparent nausea or vomiting Anesthetic complications: no  No notable events documented.  Last Vitals:  Vitals:   09/25/23 0617  BP: 108/77  Pulse: 68  Resp: 18  Temp: 36.7 C  SpO2: 98%    Last Pain: There were no vitals filed for this visit.               Sorina Derrig

## 2023-09-25 NOTE — Progress Notes (Signed)
Wasted Fentanyl 53mcg=.25ml with Leland Her RN

## 2023-09-25 NOTE — H&P (Signed)
H&P documentation  -History and Physical Reviewed  -Patient has been re-examined  -No change in the plan of care  Ronald Le  

## 2023-09-25 NOTE — Anesthesia Procedure Notes (Addendum)
Procedure Name: Intubation Date/Time: 09/25/2023 7:26 AM  Performed by: Shary Decamp, CRNAPre-anesthesia Checklist: Patient identified, Emergency Drugs available, Suction available and Patient being monitored Patient Re-evaluated:Patient Re-evaluated prior to induction Oxygen Delivery Method: Circle system utilized Preoxygenation: Pre-oxygenation with 100% oxygen Induction Type: IV induction Ventilation: Mask ventilation without difficulty Laryngoscope Size: Miller and 2 Nasal Tubes: Nasal prep performed, Nasal Rae and Right Tube size: 6.5 mm Number of attempts: 1 Placement Confirmation: ETT inserted through vocal cords under direct vision, positive ETCO2 and breath sounds checked- equal and bilateral Tube secured with: Tape Dental Injury: Teeth and Oropharynx as per pre-operative assessment

## 2023-09-26 ENCOUNTER — Encounter (HOSPITAL_COMMUNITY): Payer: Self-pay | Admitting: Oral Surgery

## 2023-11-19 DIAGNOSIS — M25559 Pain in unspecified hip: Secondary | ICD-10-CM | POA: Diagnosis not present

## 2023-11-19 DIAGNOSIS — B351 Tinea unguium: Secondary | ICD-10-CM | POA: Diagnosis not present

## 2023-11-19 DIAGNOSIS — E039 Hypothyroidism, unspecified: Secondary | ICD-10-CM | POA: Diagnosis not present

## 2023-11-19 DIAGNOSIS — Z23 Encounter for immunization: Secondary | ICD-10-CM | POA: Diagnosis not present

## 2023-11-19 DIAGNOSIS — Z0000 Encounter for general adult medical examination without abnormal findings: Secondary | ICD-10-CM | POA: Diagnosis not present

## 2023-11-19 DIAGNOSIS — K219 Gastro-esophageal reflux disease without esophagitis: Secondary | ICD-10-CM | POA: Diagnosis not present

## 2023-11-19 DIAGNOSIS — Z136 Encounter for screening for cardiovascular disorders: Secondary | ICD-10-CM | POA: Diagnosis not present

## 2023-11-19 DIAGNOSIS — E559 Vitamin D deficiency, unspecified: Secondary | ICD-10-CM | POA: Diagnosis not present

## 2023-12-03 ENCOUNTER — Ambulatory Visit (INDEPENDENT_AMBULATORY_CARE_PROVIDER_SITE_OTHER): Payer: 59 | Admitting: Podiatry

## 2023-12-03 ENCOUNTER — Encounter: Payer: Self-pay | Admitting: Podiatry

## 2023-12-03 DIAGNOSIS — M79674 Pain in right toe(s): Secondary | ICD-10-CM | POA: Diagnosis not present

## 2023-12-03 DIAGNOSIS — Q909 Down syndrome, unspecified: Secondary | ICD-10-CM

## 2023-12-03 DIAGNOSIS — B351 Tinea unguium: Secondary | ICD-10-CM

## 2023-12-03 DIAGNOSIS — M79675 Pain in left toe(s): Secondary | ICD-10-CM

## 2023-12-03 NOTE — Progress Notes (Signed)
Complaint:  Visit Type: Patient returns to my office for continued preventative foot care services. Complaint: Patient states" my nails have grown long and thick and become painful to walk and wear shoes"  The patient presents for preventative foot care services. No changes to ROS. Patient has Downs syndrome and is accompanied by his mother. He presents to the office last seen 7  months ago.  Podiatric Exam: Vascular: dorsalis pedis and posterior tibial pulses are palpable bilateral. Capillary return is immediate. Temperature gradient is WNL. Skin turgor WNL  Sensorium: Normal Semmes Weinstein monofilament test. Normal tactile sensation bilaterally. Nail Exam: Pt has thick disfigured discolored nails with subungual debris noted bilateral entire nail hallux through fifth toenails.   Ulcer Exam: There is no evidence of ulcer or pre-ulcerative changes or infection. Orthopedic Exam: Muscle tone and strength are WNL. No limitations in general ROM. No crepitus or effusions noted. Foot type and digits show no abnormalities. HAV  B/L. Skin: No Porokeratosis. No infection or ulcers  Diagnosis:  Onychomycosis, , Pain in right toe, pain in left toes  Treatment & Plan Procedures and Treatment: Consent by patient was obtained for treatment procedures.   Debridement of mycotic and hypertrophic toenails, 1 through 5 bilateral and clearing of subungual debris. No ulceration, no infection noted.  Return Visit-Office Procedure: Patient instructed to return to the office for a follow up visit 12  weeks  for continued evaluation and treatment.    Helane Gunther DPM

## 2023-12-10 DIAGNOSIS — M25552 Pain in left hip: Secondary | ICD-10-CM | POA: Diagnosis not present

## 2023-12-10 DIAGNOSIS — M25559 Pain in unspecified hip: Secondary | ICD-10-CM | POA: Diagnosis not present

## 2023-12-10 DIAGNOSIS — M9190 Juvenile osteochondrosis of hip and pelvis, unspecified, unspecified leg: Secondary | ICD-10-CM | POA: Diagnosis not present

## 2023-12-10 DIAGNOSIS — M25551 Pain in right hip: Secondary | ICD-10-CM | POA: Diagnosis not present

## 2023-12-10 DIAGNOSIS — Q6589 Other specified congenital deformities of hip: Secondary | ICD-10-CM | POA: Diagnosis not present

## 2024-01-19 DIAGNOSIS — K219 Gastro-esophageal reflux disease without esophagitis: Secondary | ICD-10-CM | POA: Diagnosis not present

## 2024-02-02 DIAGNOSIS — E039 Hypothyroidism, unspecified: Secondary | ICD-10-CM | POA: Diagnosis not present

## 2024-02-03 DIAGNOSIS — E039 Hypothyroidism, unspecified: Secondary | ICD-10-CM | POA: Diagnosis not present

## 2024-03-21 ENCOUNTER — Encounter: Payer: Self-pay | Admitting: Podiatry

## 2024-03-21 ENCOUNTER — Ambulatory Visit (INDEPENDENT_AMBULATORY_CARE_PROVIDER_SITE_OTHER): Admitting: Podiatry

## 2024-03-21 DIAGNOSIS — Q909 Down syndrome, unspecified: Secondary | ICD-10-CM | POA: Diagnosis not present

## 2024-03-21 DIAGNOSIS — M79674 Pain in right toe(s): Secondary | ICD-10-CM

## 2024-03-21 DIAGNOSIS — M79675 Pain in left toe(s): Secondary | ICD-10-CM

## 2024-03-21 DIAGNOSIS — B351 Tinea unguium: Secondary | ICD-10-CM

## 2024-03-21 DIAGNOSIS — K219 Gastro-esophageal reflux disease without esophagitis: Secondary | ICD-10-CM | POA: Diagnosis not present

## 2024-03-21 DIAGNOSIS — K220 Achalasia of cardia: Secondary | ICD-10-CM | POA: Diagnosis not present

## 2024-03-21 NOTE — Progress Notes (Signed)
 Complaint:  Visit Type: Patient returns to my office for continued preventative foot care services. Complaint: Patient states" my nails have grown long and thick and become painful to walk and wear shoes"  The patient presents for preventative foot care services. No changes to ROS. Patient has Downs syndrome and is accompanied by his mother. He presents to the office last seen 7  months ago.  Podiatric Exam: Vascular: dorsalis pedis and posterior tibial pulses are palpable bilateral. Capillary return is immediate. Temperature gradient is WNL. Skin turgor WNL  Sensorium: Normal Semmes Weinstein monofilament test. Normal tactile sensation bilaterally. Nail Exam: Pt has thick disfigured discolored nails with subungual debris noted bilateral entire nail hallux through fifth toenails.   Ulcer Exam: There is no evidence of ulcer or pre-ulcerative changes or infection. Orthopedic Exam: Muscle tone and strength are WNL. No limitations in general ROM. No crepitus or effusions noted. Foot type and digits show no abnormalities. HAV  B/L. Skin: No Porokeratosis. No infection or ulcers  Diagnosis:  Onychomycosis, , Pain in right toe, pain in left toes  Treatment & Plan Procedures and Treatment: Consent by patient was obtained for treatment procedures.   Debridement of mycotic and hypertrophic toenails, 1 through 5 bilateral and clearing of subungual debris. No ulceration, no infection noted.  ROV.  Discussed surgery for the permanent removal of all ten toenails.  Refer to Dr.  Michalene Agee. Return Visit-Office Procedure: Patient instructed to return to the office for a follow up visit 12  weeks  for continued evaluation and treatment.    Ruffin Cotton DPM

## 2024-04-04 DIAGNOSIS — E039 Hypothyroidism, unspecified: Secondary | ICD-10-CM | POA: Diagnosis not present

## 2024-04-07 ENCOUNTER — Ambulatory Visit: Admitting: Podiatry

## 2024-04-28 ENCOUNTER — Ambulatory Visit (INDEPENDENT_AMBULATORY_CARE_PROVIDER_SITE_OTHER): Admitting: Podiatry

## 2024-04-28 ENCOUNTER — Encounter: Payer: Self-pay | Admitting: Podiatry

## 2024-04-28 VITALS — Ht <= 58 in | Wt 114.0 lb

## 2024-04-28 DIAGNOSIS — M79675 Pain in left toe(s): Secondary | ICD-10-CM

## 2024-04-28 DIAGNOSIS — B351 Tinea unguium: Secondary | ICD-10-CM

## 2024-04-28 DIAGNOSIS — M79674 Pain in right toe(s): Secondary | ICD-10-CM | POA: Diagnosis not present

## 2024-04-28 NOTE — Progress Notes (Signed)
  Subjective:  Patient ID: Ronald Le, male    DOB: Nov 28, 1990,  MRN: [id number]  Chief Complaint  Patient presents with   Nail Problem    Pt is here to get information about getting permanent removal of all 10 toenails.    Discussed the use of AI scribe software for clinical note transcription with the patient, who gave verbal consent to proceed.  History of Present Illness Ronald Le is a 33 year old male with Down syndrome who presents with severe dystrophy and onychomycosis of all ten toenails bilaterally. He is accompanied by his guardian.  He experiences severe dystrophy and onychomycosis affecting all ten toenails bilaterally, causing significant pain in both feet, with the right foot being more painful than the left. The condition has been persistent and is impacting his daily activities due to discomfort.  He has a history of Down syndrome, which complicates the use of anesthesia due to increased risk. There is concern about potential complications with anesthesia, given his condition and other unspecified health issues. His guardian, who has recently obtained guardianship, is not fully aware of his complete medical history, including any heart or lung problems.  No known diabetes, heart, or lung problems.      Objective:    Physical Exam VASCULAR: DP and PT pulse palpable. Foot is warm and well-perfused. Capillary fill time is brisk. DERMATOLOGIC: Normal skin turgor, texture, and temperature. No open lesions, rashes, or ulcerations. NEUROLOGIC: Normal sensation to light touch and pressure. No paresthesias. ORTHOPEDIC: Severe dystrophy and onychomycosis of all ten nails bilaterally. Smooth pain-free range of motion of all examined joints. No ecchymosis or bruising. No gross deformity. No pain to palpation. GENERAL: Well nourished.       Results     Assessment:   1. Pain due to onychomycosis of toenails of both feet      Plan:  Patient was  evaluated and treated and all questions answered.  Assessment and Plan Assessment & Plan Onychomycosis Severe dystrophy and onychomycosis of all ten toenails bilaterally, causing pain and discomfort. The procedure involves toenail removal and nail root destruction with acid, which may cause inflammation and discomfort. Recurrence rate is low, less than 10%. - I recommended proceeding with surgery 1 foot at a time - Discuss sedation options with anesthesiologist to minimize anesthesia risk. - Administer antibiotics post-procedure to reduce infection risk. - Instruct on post-operative care including bandaging, soaking, and ointment application.  Down syndrome Down syndrome increases anesthesia risk. The procedure on both feet at once minimizes anesthesia exposure. Anesthesiologist will review chart and history for safest sedation method. Guardianship recently obtained; consult with primary care provider to ensure fitness for procedure. - Consult with primary care provider to review medical history and ensure fitness for the procedure. - Coordinate with anesthesiologist to determine appropriate sedation method.      No follow-ups on file.

## 2024-05-05 ENCOUNTER — Ambulatory Visit: Payer: 59 | Admitting: Podiatry

## 2024-05-12 ENCOUNTER — Telehealth: Payer: Self-pay

## 2024-05-12 NOTE — Telephone Encounter (Signed)
 DOS 05/20/2024  EXC NAIL PERM 1-5 RT  [employer]   Notification or Prior Authorization is not required for the requested services You are not required to submit a notification/prior authorization based on the information provided. If you have general questions about the prior authorization requirements, visit [email or web address] > Clinician Resources > Advance and Admission Notification Requirements. The number above acknowledges your notification. Please write this reference number down for future reference. If you would like to request an organization determination, please call us  at [phone]. Decision ID #: [id number]

## 2024-05-20 ENCOUNTER — Other Ambulatory Visit: Payer: Self-pay | Admitting: Podiatry

## 2024-05-20 DIAGNOSIS — M79675 Pain in left toe(s): Secondary | ICD-10-CM | POA: Diagnosis not present

## 2024-05-20 DIAGNOSIS — B358 Other dermatophytoses: Secondary | ICD-10-CM | POA: Diagnosis not present

## 2024-05-20 DIAGNOSIS — B351 Tinea unguium: Secondary | ICD-10-CM | POA: Diagnosis not present

## 2024-05-20 MED ORDER — NEOMYCIN-POLYMYXIN-HC 3.5-10000-1 OT SUSP: OTIC | 0 refills | Status: DC

## 2024-05-23 ENCOUNTER — Telehealth: Payer: Self-pay | Admitting: Podiatry

## 2024-05-23 MED ORDER — NEOMYCIN-POLYMYXIN-HC 3.5-10000-1 OT SUSP: OTIC | 0 refills | Status: AC

## 2024-05-23 NOTE — Addendum Note (Signed)
 Addended byBETHA MEDICINE, Irine Heminger R on: 05/23/2024 09:52 AM   Modules accepted: Orders

## 2024-05-23 NOTE — Telephone Encounter (Signed)
 Patient requests his script be sent to  [id number] [place], [place] - [address] Phone: [phone]  Fax: [phone]

## 2024-05-31 ENCOUNTER — Ambulatory Visit (INDEPENDENT_AMBULATORY_CARE_PROVIDER_SITE_OTHER): Admitting: Podiatry

## 2024-05-31 ENCOUNTER — Encounter: Payer: Self-pay | Admitting: Podiatry

## 2024-05-31 DIAGNOSIS — M79674 Pain in right toe(s): Secondary | ICD-10-CM

## 2024-05-31 DIAGNOSIS — M79675 Pain in left toe(s): Secondary | ICD-10-CM

## 2024-05-31 DIAGNOSIS — B351 Tinea unguium: Secondary | ICD-10-CM

## 2024-06-05 NOTE — Progress Notes (Signed)
  Subjective:  Patient ID: Ronald Le, male    DOB: [birth year],  MRN: [id number]  Chief Complaint  Patient presents with   Routine Post Op    POV # 1 DOS 05/20/24 RT FOOT PERM REMOVAL 5 TOENAILS Sister stated, I think they are doing pretty good.  I hope he tells us  we can slow down on the soaks.  I'm concerned about the middle toe.       33 y.o. male returns for post-op check.  The third 1 has a little bit more scab than the others but otherwise doing okay  Review of Systems: Negative except as noted in the HPI. Denies N/V/F/Ch.   Objective:  There were no vitals filed for this visit. There is no height or weight on file to calculate BMI. Constitutional Well developed. Well nourished.  Vascular Foot warm and well perfused. Capillary refill normal to all digits.  Calf is soft and supple, no posterior calf or knee pain, negative Homans' sign  Neurologic Normal speech. Oriented to person, place, and time. Epicritic sensation to light touch grossly present bilaterally.  Dermatologic Skin healing well without signs of infection.  Increased scabbing around the third toe but healing well and signs and symptoms are consistent with healing matricectomy sites  Orthopedic: He has no pain to palpation noted about the surgical site.    Assessment:   1. Pain due to onychomycosis of toenails of both feet    Plan:  Patient was evaluated and treated and all questions answered.  S/p foot surgery right total matricectomy toes 1 through 5 -Progressing as expected post-operatively.  May gradually decrease soaking and leave open to air over the next couple of weeks.  His other foot is scheduled for August 1.  Follow-up before then if further issues.   No follow-ups on file.

## 2024-06-20 ENCOUNTER — Telehealth: Payer: Self-pay | Admitting: Podiatry

## 2024-06-20 NOTE — Telephone Encounter (Signed)
 DOS- 06/24/2024  EXC NAILS PERMANENT (ALL 5) LT- 11750  [employer] EFFECTIVE DATE- 11/25/2023  DEDUCTIBLE- $257 REMAINING- $0 OOP- $9350 REMANING- $1205.02 COINSURANCE- $0  PER [employer] WEBSITE, PRIOR AUTH IS NOT REQUIRED FOR CPT CODE 88249. DECISION ID # [id number]

## 2024-06-21 NOTE — Procedures (Signed)
 [name]

## 2024-06-23 DIAGNOSIS — K219 Gastro-esophageal reflux disease without esophagitis: Secondary | ICD-10-CM | POA: Diagnosis not present

## 2024-06-23 DIAGNOSIS — K220 Achalasia of cardia: Secondary | ICD-10-CM | POA: Diagnosis not present

## 2024-06-24 DIAGNOSIS — L600 Ingrowing nail: Secondary | ICD-10-CM | POA: Diagnosis not present

## 2024-06-24 DIAGNOSIS — B351 Tinea unguium: Secondary | ICD-10-CM | POA: Diagnosis not present

## 2024-07-07 ENCOUNTER — Encounter: Payer: Self-pay | Admitting: Podiatry

## 2024-07-07 ENCOUNTER — Ambulatory Visit (INDEPENDENT_AMBULATORY_CARE_PROVIDER_SITE_OTHER): Admitting: Podiatry

## 2024-07-07 VITALS — Ht <= 58 in | Wt 113.0 lb

## 2024-07-07 DIAGNOSIS — M79675 Pain in left toe(s): Secondary | ICD-10-CM | POA: Diagnosis not present

## 2024-07-07 DIAGNOSIS — B351 Tinea unguium: Secondary | ICD-10-CM

## 2024-07-07 DIAGNOSIS — M79674 Pain in right toe(s): Secondary | ICD-10-CM

## 2024-07-07 NOTE — Patient Instructions (Signed)
 Use 1 cup white vinegar per gallon of warm water to soak the toes

## 2024-07-07 NOTE — Progress Notes (Signed)
  Subjective:  Patient ID: Ronald Le, male    DOB: 03-24-1991,  MRN: [id number]  Chief Complaint  Patient presents with   Post-op Follow-up    Rm 20 Patient is here for post op visit #1. Patient has dry flaky skin over both feet post surgery and epsom salt soaks.    DOS: 06/24/2024 Procedure: Removal of toenail of left foot permanent x 5  33 y.o. male returns for post-op check.  Overall doing well  Review of Systems: Negative except as noted in the HPI. Denies N/V/F/Ch.   Objective:  There were no vitals filed for this visit. Body mass index is 24.45 kg/m. Constitutional Well developed. Well nourished.  Vascular Foot warm and well perfused. Capillary refill normal to all digits.  Calf is soft and supple, no posterior calf or knee pain, negative Homans' sign  Neurologic Normal speech. Oriented to person, place, and time. Epicritic sensation to light touch grossly present bilaterally.  Dermatologic Skin healing well without signs of infection. Skin edges well coapted without signs of infection.  Orthopedic: Tenderness to palpation noted about the surgical site.   Assessment:   1. Pain due to onychomycosis of toenails of both feet    Plan:  Patient was evaluated and treated and all questions answered.  S/p foot surgery left - Doing well has some dry skin due to the Epsom salt advised to switch to vinegar and warm water mix.  Has Present on Right Foot As Well.  Return in 1 Month to Reevaluate and We Will Debride the Nail Sites to See If This Will Alleviate the Residual Scab.  No follow-ups on file.

## 2024-07-28 DIAGNOSIS — E039 Hypothyroidism, unspecified: Secondary | ICD-10-CM | POA: Diagnosis not present

## 2024-08-02 ENCOUNTER — Ambulatory Visit (INDEPENDENT_AMBULATORY_CARE_PROVIDER_SITE_OTHER): Admitting: Podiatry

## 2024-08-02 VITALS — Ht <= 58 in | Wt 113.0 lb

## 2024-08-02 DIAGNOSIS — B351 Tinea unguium: Secondary | ICD-10-CM

## 2024-08-02 DIAGNOSIS — M79674 Pain in right toe(s): Secondary | ICD-10-CM | POA: Diagnosis not present

## 2024-08-02 DIAGNOSIS — M79675 Pain in left toe(s): Secondary | ICD-10-CM

## 2024-08-03 MED ORDER — CEPHALEXIN 500 MG PO CAPS: 500.0000 mg | ORAL_CAPSULE | Freq: Three times a day (TID) | ORAL | 0 refills | Status: AC

## 2024-08-03 NOTE — Progress Notes (Signed)
  Subjective:  Patient ID: Ronald Le, male    DOB: 1991-10-13,  MRN: [id number]  Chief Complaint  Patient presents with   Nail Problem    RM 3 Pt is here for return visit for total nail avulsions of all toe nails. Nail beds have scabbed over. Redness/irritation of 3rd left toe. Moderate improvement of skin redness of both feet.    DOS: 06/24/2024 Procedure: Removal of toenail of left foot permanent x 5  33 y.o. male returns for post-op check.  Overall doing well still has persistent scab  Review of Systems: Negative except as noted in the HPI. Denies N/V/F/Ch.   Objective:  There were no vitals filed for this visit. Body mass index is 24.45 kg/m. Constitutional Well developed. Well nourished.  Vascular Foot warm and well perfused. Capillary refill normal to all digits.  Calf is soft and supple, no posterior calf or knee pain, negative Homans' sign  Neurologic Normal speech. Oriented to person, place, and time. Epicritic sensation to light touch grossly present bilaterally.  Dermatologic Persistent scabs on both feet healing well on the right foot some erythema still on the left foot third toe  Orthopedic: Tenderness to palpation noted about the surgical site.   Assessment:   1. Pain due to onychomycosis of toenails of both feet    Plan:  Patient was evaluated and treated and all questions answered.  S/p foot surgery left - Still doing well.  Was able to debride some on the right foot today but not as much of the left foot.  Prescribed him cephalexin  for 1 week due to some erythema from the drainage.  See back in 4 weeks to reevaluate and we will plan to debride the nails further  Return in about 4 weeks (around 08/30/2024) for nail re-check.

## 2024-08-11 DIAGNOSIS — E039 Hypothyroidism, unspecified: Secondary | ICD-10-CM | POA: Diagnosis not present

## 2024-08-30 ENCOUNTER — Ambulatory Visit: Admitting: Podiatry

## 2024-08-30 VITALS — Ht <= 58 in | Wt 113.0 lb

## 2024-08-30 DIAGNOSIS — B351 Tinea unguium: Secondary | ICD-10-CM | POA: Diagnosis not present

## 2024-08-30 DIAGNOSIS — M79675 Pain in left toe(s): Secondary | ICD-10-CM

## 2024-08-30 DIAGNOSIS — M79674 Pain in right toe(s): Secondary | ICD-10-CM | POA: Diagnosis not present

## 2024-09-01 NOTE — Progress Notes (Signed)
  Subjective:  Patient ID: Ronald Le, male    DOB: Mar 11, 1991,  MRN: [id number]  Chief Complaint  Patient presents with   Nail Problem    Rm 1 Patient is here for nail recheck. 2nd and 3rd toes on rt/ft foot are scabbed over.    DOS: 06/24/2024 Procedure: Removal of toenail of left foot permanent x 5  33 y.o. male returns for post-op check.  Overall doing well  Review of Systems: Negative except as noted in the HPI. Denies N/V/F/Ch.   Objective:  There were no vitals filed for this visit. Body mass index is 24.45 kg/m. Constitutional Well developed. Well nourished.  Vascular Foot warm and well perfused. Capillary refill normal to all digits.  Calf is soft and supple, no posterior calf or knee pain, negative Homans' sign  Neurologic Normal speech. Oriented to person, place, and time. Epicritic sensation to light touch grossly present bilaterally.  Dermatologic Erythema has resolved, scabs healing nicely  Orthopedic: Tenderness to palpation noted about the surgical site.   Assessment:   1. Pain due to onychomycosis of toenails of both feet    Plan:  Patient was evaluated and treated and all questions answered.  S/p foot surgery left - Still doing well.  Only areas remaining are the third toes now.  Return in about 6 weeks if still persistent for further debridement.  Return in about 6 weeks (around 10/11/2024) for nail re-check.

## 2024-10-11 ENCOUNTER — Ambulatory Visit: Admitting: Podiatry

## 2024-10-11 VITALS — Ht <= 58 in | Wt 113.0 lb

## 2024-10-11 DIAGNOSIS — B351 Tinea unguium: Secondary | ICD-10-CM | POA: Diagnosis not present

## 2024-10-11 DIAGNOSIS — M79674 Pain in right toe(s): Secondary | ICD-10-CM

## 2024-10-11 DIAGNOSIS — M79675 Pain in left toe(s): Secondary | ICD-10-CM

## 2024-10-13 ENCOUNTER — Encounter: Payer: Self-pay | Admitting: Podiatry

## 2024-10-13 NOTE — Progress Notes (Signed)
  Subjective:  Patient ID: Ronald Le, male    DOB: Dec 27, 1990,  MRN: [id number]  Chief Complaint  Patient presents with   Nail Problem    Rm 1 Return in about 6 weeks (around 10/11/2024) for nail re-check. Pt is happy with progress of nail regrowth, some thickening of the right and left 3rd toe nails.    DOS: 06/24/2024 Procedure: Removal of toenail of left foot permanent x 5  33 y.o. male returns for post-op check.  Overall doing well, only a scab remains on the third toes  Review of Systems: Negative except as noted in the HPI. Denies N/V/F/Ch.   Objective:  There were no vitals filed for this visit. Body mass index is 24.45 kg/m. Constitutional Well developed. Well nourished.  Vascular Foot warm and well perfused. Capillary refill normal to all digits.  Calf is soft and supple, no posterior calf or knee pain, negative Homans' sign  Neurologic Normal speech. Oriented to person, place, and time. Epicritic sensation to light touch grossly present bilaterally.  Dermatologic Erythema has resolved, scabs healing nicely  Orthopedic: Tenderness to palpation noted about the surgical site.   Assessment:   1. Pain due to onychomycosis of toenails of both feet    Plan:  Patient was evaluated and treated and all questions answered.  S/p foot surgery left - Doing very well is able to debride the remaining portions of the third toe matricectomy sites he is pleased with his progress as is his family.  He is able to wear shoes and boots as tolerated that he wishes.  Follow-up as needed for this  No follow-ups on file.
